# Patient Record
Sex: Male | Born: 1955 | Race: White | Hispanic: No | Marital: Married | Smoking: Former smoker
Health system: Southern US, Community
[De-identification: ages and names within clinical notes are randomized; demographics above are authoritative.]

## PROBLEM LIST (undated history)

## (undated) DIAGNOSIS — J45909 Unspecified asthma, uncomplicated: Secondary | ICD-10-CM

## (undated) DIAGNOSIS — M75101 Unspecified rotator cuff tear or rupture of right shoulder, not specified as traumatic: Secondary | ICD-10-CM

## (undated) DIAGNOSIS — E785 Hyperlipidemia, unspecified: Secondary | ICD-10-CM

## (undated) DIAGNOSIS — M1990 Unspecified osteoarthritis, unspecified site: Secondary | ICD-10-CM

## (undated) HISTORY — PX: JOINT REPLACEMENT: SHX530

## (undated) HISTORY — PX: TOTAL HIP ARTHROPLASTY: SHX124

## (undated) HISTORY — DX: Unspecified osteoarthritis, unspecified site: M19.90

## (undated) HISTORY — PX: COLONOSCOPY: SHX174

## (undated) HISTORY — DX: Hyperlipidemia, unspecified: E78.5

---

## 2003-02-25 ENCOUNTER — Inpatient Hospital Stay (HOSPITAL_COMMUNITY): Admission: RE | Admit: 2003-02-25 | Discharge: 2003-02-27 | Payer: Self-pay | Admitting: Orthopedic Surgery

## 2003-06-03 ENCOUNTER — Inpatient Hospital Stay (HOSPITAL_COMMUNITY): Admission: RE | Admit: 2003-06-03 | Discharge: 2003-06-05 | Payer: Self-pay | Admitting: Orthopedic Surgery

## 2011-05-10 ENCOUNTER — Encounter: Payer: Self-pay | Admitting: Gastroenterology

## 2017-11-02 LAB — MEASLES/MUMPS/RUBELLA IMMUNITY
Mumps IgG: >300
Rubella Virus Antibody IgG: 26.6

## 2018-03-06 LAB — LIPID PANEL
Cholesterol: 237 — AB (ref 0–200)
HDL: 54 (ref 35–70)
LDL CALC: 165
TRIGLYCERIDES: 81 (ref 40–160)

## 2018-03-06 LAB — CBC AND DIFFERENTIAL
HEMATOCRIT: 45 (ref 41–53)
HEMOGLOBIN: 15.2 (ref 13.5–17.5)
Neutrophils Absolute: 4100
WBC: 7.4

## 2018-03-06 LAB — BASIC METABOLIC PANEL
BUN: 21 (ref 4–21)
Creatinine: 1 (ref 0.6–1.3)
Glucose: 119
Potassium: 4.4 (ref 3.4–5.3)
SODIUM: 137 (ref 137–147)

## 2018-03-06 LAB — HEPATIC FUNCTION PANEL
ALT: 21 (ref 10–40)
AST: 16 (ref 14–40)
Alkaline Phosphatase: 55 (ref 25–125)
Bilirubin, Total: 0.9

## 2018-03-07 LAB — HEMOGLOBIN A1C: HEMOGLOBIN A1C: 5.9

## 2018-03-15 ENCOUNTER — Encounter: Payer: Self-pay | Admitting: Family Medicine

## 2018-03-15 ENCOUNTER — Ambulatory Visit (INDEPENDENT_AMBULATORY_CARE_PROVIDER_SITE_OTHER): Payer: BC Managed Care – PPO | Admitting: Family Medicine

## 2018-03-15 VITALS — BP 124/78 | HR 74 | Temp 98.1°F | Ht 69.0 in | Wt 221.0 lb

## 2018-03-15 DIAGNOSIS — R739 Hyperglycemia, unspecified: Secondary | ICD-10-CM | POA: Insufficient documentation

## 2018-03-15 DIAGNOSIS — Z1211 Encounter for screening for malignant neoplasm of colon: Secondary | ICD-10-CM | POA: Diagnosis not present

## 2018-03-15 DIAGNOSIS — E785 Hyperlipidemia, unspecified: Secondary | ICD-10-CM | POA: Insufficient documentation

## 2018-03-15 DIAGNOSIS — Z6832 Body mass index (BMI) 32.0-32.9, adult: Secondary | ICD-10-CM | POA: Diagnosis not present

## 2018-03-15 DIAGNOSIS — Z0001 Encounter for general adult medical examination with abnormal findings: Secondary | ICD-10-CM | POA: Diagnosis not present

## 2018-03-15 MED ORDER — ATORVASTATIN CALCIUM 40 MG PO TABS: 40.0000 mg | ORAL_TABLET | Freq: Every day | ORAL | 3 refills | Status: DC

## 2018-03-15 NOTE — Assessment & Plan Note (Addendum)
Patient with elevated total cholesterol and LDL greater than 160 on recent labs.  These results will be scanned into the chart.  We will start Lipitor 40 mg daily.  Discussed potential side effects.  Recheck in 1 year.

## 2018-03-15 NOTE — Progress Notes (Signed)
Chief Complaint:  Chad Manning is a 63 y.o. male who presents today for his annual comprehensive physical exam and to establish care.   Assessment/Plan:  Hyperglycemia Fasting glucose 119 and A1c 5.9 on recent blood work.  Does not want to start medications today.  Discussed lifestyle modifications including regular exercise and healthy, low carb diet.  We will recheck in 1 year.  Dyslipidemia Patient with elevated total cholesterol and LDL greater than 160 on recent labs.  These results will be scanned into the chart.  We will start Lipitor 40 mg daily.  Discussed potential side effects.  Recheck in 1 year.  BMI 32 Discussed lifestyle modifications.  Preventative Healthcare: Will place referral for colon cancer screening.  Patient Counseling(The following topics were reviewed and/or handout was given):  -Nutrition: Stressed importance of moderation in sodium/caffeine intake, saturated fat and cholesterol, caloric balance, sufficient intake of fresh fruits, vegetables, and fiber.  -Stressed the importance of regular exercise.   -Substance Abuse: Discussed cessation/primary prevention of tobacco, alcohol, or other drug use; driving or other dangerous activities under the influence; availability of treatment for abuse.   -Injury prevention: Discussed safety belts, safety helmets, smoke detector, smoking near bedding or upholstery.   -Sexuality: Discussed sexually transmitted diseases, partner selection, use of condoms, avoidance of unintended pregnancy and contraceptive alternatives.   -Dental health: Discussed importance of regular tooth brushing, flossing, and dental visits.  -Health maintenance and immunizations reviewed. Please refer to Health maintenance section.  Return to care in 1 year for next preventative visit.     Subjective:  HPI:  He has no acute complaints today.   Lifestyle Diet: No specific diets or eating plans.  Exercise: No specific exercises.    Depression screen PHQ 2/9 03/15/2018  Decreased Interest 0  Down, Depressed, Hopeless 0  PHQ - 2 Score 0   Health Maintenance Due  Topic Date Due  . Hepatitis C Screening  14-Apr-1955  . HIV Screening  06/20/1970  . COLONOSCOPY  06/19/2005    ROS: A complete review of systems was negative.   PMH:  The following were reviewed and entered/updated in epic: Past Medical History:  Diagnosis Date  . Hyperlipidemia    Patient Active Problem List   Diagnosis Date Noted  . Dyslipidemia 03/15/2018  . Hyperglycemia 03/15/2018   Past Surgical History:  Procedure Laterality Date  . TOTAL HIP ARTHROPLASTY     bilatera    Family History  Problem Relation Age of Onset  . Heart disease Father   . Cancer Mother   . Lung cancer Sister        smoker  . Colon cancer Neg Hx   . Prostate cancer Neg Hx     Medications- reviewed and updated Current Outpatient Medications  Medication Sig Dispense Refill  . aspirin 81 MG tablet Take 81 mg by mouth daily.    Marland Kitchen atorvastatin (LIPITOR) 40 MG tablet Take 1 tablet (40 mg total) by mouth daily. 90 tablet 3   No current facility-administered medications for this visit.    Allergies-reviewed and updated No Known Allergies  Social History   Socioeconomic History  . Marital status: Married    Spouse name: Not on file  . Number of children: Not on file  . Years of education: Not on file  . Highest education level: Not on file  Occupational History  . Not on file  Social Needs  . Financial resource strain: Not on file  . Food insecurity:    Worry:  Not on file    Inability: Not on file  . Transportation needs:    Medical: Not on file    Non-medical: Not on file  Tobacco Use  . Smoking status: Former Research scientist (life sciences)  . Smokeless tobacco: Never Used  Substance and Sexual Activity  . Alcohol use: Yes  . Drug use: Never  . Sexual activity: Yes  Lifestyle  . Physical activity:    Days per week: Not on file    Minutes per session: Not on  file  . Stress: Not on file  Relationships  . Social connections:    Talks on phone: Not on file    Gets together: Not on file    Attends religious service: Not on file    Active member of club or organization: Not on file    Attends meetings of clubs or organizations: Not on file    Relationship status: Not on file  Other Topics Concern  . Not on file  Social History Narrative  . Not on file        Objective:  Physical Exam: BP 124/78 (BP Location: Left Arm, Patient Position: Sitting, Cuff Size: Normal)   Pulse 74   Temp 98.1 F (36.7 C) (Oral)   Ht 5\' 9"  (1.753 m)   Wt 221 lb (100.2 kg)   SpO2 95%   BMI 32.64 kg/m   Body mass index is 32.64 kg/m. Wt Readings from Last 3 Encounters:  03/15/18 221 lb (100.2 kg)   Gen: NAD, resting comfortably HEENT: TMs normal bilaterally. OP clear. No thyromegaly noted.  CV: RRR with no murmurs appreciated Pulm: NWOB, CTAB with no crackles, wheezes, or rhonchi GI: Normal bowel sounds present. Soft, Nontender, Nondistended. MSK: no edema, cyanosis, or clubbing noted Skin: warm, dry Neuro: CN2-12 grossly intact. Strength 5/5 in upper and lower extremities. Reflexes symmetric and intact bilaterally.  Psych: Normal affect and thought content     Kamren Heskett M. Jerline Pain, MD 03/15/2018 8:49 AM

## 2018-03-15 NOTE — Assessment & Plan Note (Signed)
Fasting glucose 119 and A1c 5.9 on recent blood work.  Does not want to start medications today.  Discussed lifestyle modifications including regular exercise and healthy, low carb diet.  We will recheck in 1 year.

## 2018-03-15 NOTE — Patient Instructions (Signed)
It was very nice to see you today!  Please start the lipitor.   Eat at least 3 REAL meals and 1-2 snacks per day.  Aim for no more than 5 hours between eating.  Eat breakfast within one hour of getting up.    Obtain twice as many fruits/vegetables as protein or carbohydrate foods for both lunch and dinner.   Cut down on sweet beverages. This includes juice, soda, and sweet tea.    Exercise at least 150 minutes every week.    I will place a referral for your colonoscopy.  Come back in 1 year for your next physical or sooner as needed.   Take care, Dr Jerline Pain   Preventive Care 40-64 Years, Male Preventive care refers to lifestyle choices and visits with your health care provider that can promote health and wellness. What does preventive care include?   A yearly physical exam. This is also called an annual well check.  Dental exams once or twice a year.  Routine eye exams. Ask your health care provider how often you should have your eyes checked.  Personal lifestyle choices, including: ? Daily care of your teeth and gums. ? Regular physical activity. ? Eating a healthy diet. ? Avoiding tobacco and drug use. ? Limiting alcohol use. ? Practicing safe sex. ? Taking low-dose aspirin every day starting at age 79. What happens during an annual well check? The services and screenings done by your health care provider during your annual well check will depend on your age, overall health, lifestyle risk factors, and family history of disease. Counseling Your health care provider may ask you questions about your:  Alcohol use.  Tobacco use.  Drug use.  Emotional well-being.  Home and relationship well-being.  Sexual activity.  Eating habits.  Work and work Statistician. Screening You may have the following tests or measurements:  Height, weight, and BMI.  Blood pressure.  Lipid and cholesterol levels. These may be checked every 5 years, or more frequently if  you are over 14 years old.  Skin check.  Lung cancer screening. You may have this screening every year starting at age 30 if you have a 30-pack-year history of smoking and currently smoke or have quit within the past 15 years.  Colorectal cancer screening. All adults should have this screening starting at age 99 and continuing until age 43. Your health care provider may recommend screening at age 78. You will have tests every 1-10 years, depending on your results and the type of screening test. People at increased risk should start screening at an earlier age. Screening tests may include: ? Guaiac-based fecal occult blood testing. ? Fecal immunochemical test (FIT). ? Stool DNA test. ? Virtual colonoscopy. ? Sigmoidoscopy. During this test, a flexible tube with a tiny camera (sigmoidoscope) is used to examine your rectum and lower colon. The sigmoidoscope is inserted through your anus into your rectum and lower colon. ? Colonoscopy. During this test, a long, thin, flexible tube with a tiny camera (colonoscope) is used to examine your entire colon and rectum.  Prostate cancer screening. Recommendations will vary depending on your family history and other risks.  Hepatitis C blood test.  Hepatitis B blood test.  Sexually transmitted disease (STD) testing.  Diabetes screening. This is done by checking your blood sugar (glucose) after you have not eaten for a while (fasting). You may have this done every 1-3 years. Discuss your test results, treatment options, and if necessary, the need for more tests with  your health care provider. Vaccines Your health care provider may recommend certain vaccines, such as:  Influenza vaccine. This is recommended every year.  Tetanus, diphtheria, and acellular pertussis (Tdap, Td) vaccine. You may need a Td booster every 10 years.  Varicella vaccine. You may need this if you have not been vaccinated.  Zoster vaccine. You may need this after age  6.  Measles, mumps, and rubella (MMR) vaccine. You may need at least one dose of MMR if you were born in 1957 or later. You may also need a second dose.  Pneumococcal 13-valent conjugate (PCV13) vaccine. You may need this if you have certain conditions and have not been vaccinated.  Pneumococcal polysaccharide (PPSV23) vaccine. You may need one or two doses if you smoke cigarettes or if you have certain conditions.  Meningococcal vaccine. You may need this if you have certain conditions.  Hepatitis A vaccine. You may need this if you have certain conditions or if you travel or work in places where you may be exposed to hepatitis A.  Hepatitis B vaccine. You may need this if you have certain conditions or if you travel or work in places where you may be exposed to hepatitis B.  Haemophilus influenzae type b (Hib) vaccine. You may need this if you have certain risk factors. Talk to your health care provider about which screenings and vaccines you need and how often you need them. This information is not intended to replace advice given to you by your health care provider. Make sure you discuss any questions you have with your health care provider. Document Released: 01/24/2015 Document Revised: 02/17/2017 Document Reviewed: 10/29/2014 Elsevier Interactive Patient Education  2019 [employer].

## 2018-03-28 ENCOUNTER — Encounter: Payer: Self-pay | Admitting: Family Medicine

## 2018-03-31 ENCOUNTER — Encounter: Payer: Self-pay | Admitting: Family Medicine

## 2018-03-31 ENCOUNTER — Encounter: Payer: Self-pay | Admitting: Physical Therapy

## 2018-03-31 LAB — VARICELLA ZOSTER ANTIBODY, IGG: Varicella IgG: 2387

## 2018-06-13 ENCOUNTER — Encounter: Payer: Self-pay | Admitting: Internal Medicine

## 2018-07-10 ENCOUNTER — Ambulatory Visit (AMBULATORY_SURGERY_CENTER): Payer: BC Managed Care – PPO

## 2018-07-10 ENCOUNTER — Other Ambulatory Visit: Payer: Self-pay

## 2018-07-10 VITALS — Ht 71.0 in | Wt 220.0 lb

## 2018-07-10 DIAGNOSIS — Z1211 Encounter for screening for malignant neoplasm of colon: Secondary | ICD-10-CM

## 2018-07-10 MED ORDER — NA SULFATE-K SULFATE-MG SULF 17.5-3.13-1.6 GM/177ML PO SOLN: 1.0000 | Freq: Once | ORAL | 0 refills | Status: AC

## 2018-07-10 NOTE — Progress Notes (Signed)
Denies allergies to eggs or soy products. Denies complication of anesthesia or sedation. Denies use of weight loss medication. Denies use of O2.   Emmi instructions given for colonoscopy.  Pre-Visit was conducted by phone due to Covid 19. Instructions were reviewed and mailed to patients confirmed home address. Patient was encouraged to call if he had any questions or concerns regarding instructions.

## 2018-07-24 ENCOUNTER — Encounter: Payer: BC Managed Care – PPO | Admitting: Internal Medicine

## 2018-08-10 ENCOUNTER — Telehealth: Payer: Self-pay | Admitting: Internal Medicine

## 2018-08-10 NOTE — Telephone Encounter (Signed)

## 2018-08-11 ENCOUNTER — Ambulatory Visit (AMBULATORY_SURGERY_CENTER): Payer: BC Managed Care – PPO | Admitting: Internal Medicine

## 2018-08-11 ENCOUNTER — Encounter: Payer: Self-pay | Admitting: Internal Medicine

## 2018-08-11 ENCOUNTER — Other Ambulatory Visit: Payer: Self-pay

## 2018-08-11 VITALS — BP 111/66 | HR 55 | Temp 98.0°F | Resp 12 | Ht 71.0 in | Wt 220.0 lb

## 2018-08-11 DIAGNOSIS — Z1211 Encounter for screening for malignant neoplasm of colon: Secondary | ICD-10-CM

## 2018-08-11 DIAGNOSIS — D123 Benign neoplasm of transverse colon: Secondary | ICD-10-CM

## 2018-08-11 MED ORDER — SODIUM CHLORIDE 0.9 % IV SOLN: 500.0000 mL | Freq: Once | INTRAVENOUS | Status: DC

## 2018-08-11 NOTE — Op Note (Signed)
[place] Patient Name: Chad Manning Procedure Date: 08/11/2018 9:12 AM MRN: [id number] Endoscopist: Jerene Bears , MD Age: 63 Referring MD:  Date of Birth: 10-04-1955 Gender: Male Account #: [id number] Procedure:                Colonoscopy Indications:              Screening for colorectal malignant neoplasm, This                            is the patient's first colonoscopy Medicines:                Monitored Anesthesia Care Procedure:                Pre-Anesthesia Assessment:                           - Prior to the procedure, a History and Physical                            was performed, and patient medications and                            allergies were reviewed. The patient's tolerance of                            previous anesthesia was also reviewed. The risks                            and benefits of the procedure and the sedation                            options and risks were discussed with the patient.                            All questions were answered, and informed consent                            was obtained. Prior Anticoagulants: The patient has                            taken no previous anticoagulant or antiplatelet                            agents. ASA Grade Assessment: II - A patient with                            mild systemic disease. After reviewing the risks                            and benefits, the patient was deemed in                            satisfactory condition to undergo the procedure.  After obtaining informed consent, the colonoscope                            was passed under direct vision. Throughout the                            procedure, the patient's blood pressure, pulse, and                            oxygen saturations were monitored continuously. The                            Colonoscope was introduced through the anus and                            advanced to the cecum,  identified by appendiceal                            orifice and ileocecal valve. The colonoscopy was                            performed without difficulty. The patient tolerated                            the procedure well. The quality of the bowel                            preparation was good. The ileocecal valve,                            appendiceal orifice, and rectum were photographed. Scope In: 9:17:11 AM Scope Out: 9:31:03 AM Scope Withdrawal Time: 0 hours 11 minutes 27 seconds  Total Procedure Duration: 0 hours 13 minutes 52 seconds  Findings:                 The digital rectal exam was normal.                           A 5 mm polyp was found in the hepatic flexure. The                            polyp was sessile. The polyp was removed with a                            cold snare. Resection and retrieval were complete.                           Internal hemorrhoids were found during                            retroflexion. The hemorrhoids were small. Complications:            No immediate complications. Estimated Blood Loss:     Estimated blood loss was minimal. Impression:               -  One 5 mm polyp at the hepatic flexure, removed                            with a cold snare. Resected and retrieved.                           - Small internal hemorrhoids. Recommendation:           - Patient has a contact number available for                            emergencies. The signs and symptoms of potential                            delayed complications were discussed with the                            patient. Return to normal activities tomorrow.                            Written discharge instructions were provided to the                            patient.                           - Resume previous diet.                           - Continue present medications.                           - Await pathology results.                           - Repeat colonoscopy is  recommended for                            surveillance. The colonoscopy date will be                            determined after pathology results from today's                            exam become available for review. Jerene Bears, MD 08/11/2018 9:34:01 AM This report has been signed electronically.

## 2018-08-11 NOTE — Patient Instructions (Signed)
Impression/Recommendations:  Polyp handout given to patient. Hemorrhoid handout given to patient.  Resume previous diet. Continue present medications. Await pathology results. Repeat colonoscopy recommended for surveillance.  Date to be determined after pathology results reviewed.  YOU HAD AN ENDOSCOPIC PROCEDURE TODAY AT THE [facility]:   Refer to the procedure report that was given to you for any specific questions about what was found during the examination.  If the procedure report does not answer your questions, please call your gastroenterologist to clarify.  If you requested that your care partner not be given the details of your procedure findings, then the procedure report has been included in a sealed envelope for you to review at your convenience later.  YOU SHOULD EXPECT: Some feelings of bloating in the abdomen. Passage of more gas than usual.  Walking can help get rid of the air that was put into your GI tract during the procedure and reduce the bloating. If you had a lower endoscopy (such as a colonoscopy or flexible sigmoidoscopy) you may notice spotting of blood in your stool or on the toilet paper. If you underwent a bowel prep for your procedure, you may not have a normal bowel movement for a few days.  Please Note:  You might notice some irritation and congestion in your nose or some drainage.  This is from the oxygen used during your procedure.  There is no need for concern and it should clear up in a day or so.  SYMPTOMS TO REPORT IMMEDIATELY:   Following lower endoscopy (colonoscopy or flexible sigmoidoscopy):  Excessive amounts of blood in the stool  Significant tenderness or worsening of abdominal pains  Swelling of the abdomen that is new, acute  Fever of 100F or higher For urgent or emergent issues, a gastroenterologist can be reached at any hour by calling [phone].   DIET:  We do recommend a small meal at first, but then you may proceed to  your regular diet.  Drink plenty of fluids but you should avoid alcoholic beverages for 24 hours.  ACTIVITY:  You should plan to take it easy for the rest of today and you should NOT DRIVE or use heavy machinery until tomorrow (because of the sedation medicines used during the test).    FOLLOW UP: Our staff will call the number listed on your records 48-72 hours following your procedure to check on you and address any questions or concerns that you may have regarding the information given to you following your procedure. If we do not reach you, we will leave a message.  We will attempt to reach you two times.  During this call, we will ask if you have developed any symptoms of COVID 19. If you develop any symptoms (ie: fever, flu-like symptoms, shortness of breath, cough etc.) before then, please call [phone].  If you test positive for Covid 19 in the 2 weeks post procedure, please call and report this information to us.    If any biopsies were taken you will be contacted by phone or by letter within the next 1-3 weeks.  Please call us at [phone] if you have not heard about the biopsies in 3 weeks.    SIGNATURES/CONFIDENTIALITY: You and/or your care partner have signed paperwork which will be entered into your electronic medical record.  These signatures attest to the fact that that the information above on your After Visit Summary has been reviewed and is understood.  Full responsibility of the confidentiality of this   discharge information lies with you and/or your care-partner. 

## 2018-08-11 NOTE — Progress Notes (Signed)
To PACU, VSS. Report to [email or web address] 

## 2018-08-11 NOTE — Progress Notes (Signed)
Temperature taken by D.O., CMA and VS taken by [facility], CMA

## 2018-08-11 NOTE — Progress Notes (Signed)
Called to room to assist during endoscopic procedure.  Patient ID and intended procedure confirmed with present staff. Received instructions for my participation in the procedure from the performing physician.  

## 2018-08-11 NOTE — Progress Notes (Signed)
Pt's states no medical or surgical changes since previsit or office visit. 

## 2018-08-15 ENCOUNTER — Telehealth: Payer: Self-pay

## 2018-08-15 NOTE — Telephone Encounter (Signed)
  Follow up Call-  Call back number 08/11/2018  Post procedure Call Back phone  # [phone]  Permission to leave phone message Yes  Some recent data might be hidden     Patient questions:  Do you have a fever, pain , or abdominal swelling? No. Pain Score  0 *  Have you tolerated food without any problems? Yes.    Have you been able to return to your normal activities? Yes.    Do you have any questions about your discharge instructions: Diet   No. Medications  No. Follow up visit  No.  Do you have questions or concerns about your Care? No.  Actions: * If pain score is 4 or above: 1. No action needed, pain <4.Have you developed a fever since your procedure? no  2.   Have you had an respiratory symptoms (SOB or cough) since your procedure? no  3.   Have you tested positive for COVID 19 since your procedure no  4.   Have you had any family members/close contacts diagnosed with the COVID 19 since your procedure?  no   If yes to any of these questions please route to Joylene John, RN and Alphonsa Gin, Therapist, sports.

## 2018-08-17 ENCOUNTER — Encounter: Payer: Self-pay | Admitting: Internal Medicine

## 2018-11-29 DIAGNOSIS — M2021 Hallux rigidus, right foot: Secondary | ICD-10-CM | POA: Insufficient documentation

## 2019-03-05 ENCOUNTER — Other Ambulatory Visit: Payer: Self-pay | Admitting: Family Medicine

## 2019-06-01 ENCOUNTER — Other Ambulatory Visit: Payer: Self-pay | Admitting: Family Medicine

## 2019-06-07 ENCOUNTER — Telehealth: Payer: Self-pay | Admitting: Family Medicine

## 2019-06-07 DIAGNOSIS — E785 Hyperlipidemia, unspecified: Secondary | ICD-10-CM

## 2019-06-07 MED ORDER — ATORVASTATIN CALCIUM 40 MG PO TABS: ORAL_TABLET | ORAL | 0 refills | Status: DC

## 2019-06-07 NOTE — Telephone Encounter (Signed)
Lipitor 40 MG sent to [place]. Not sure why patient is requesting a PA.

## 2019-06-07 NOTE — Telephone Encounter (Signed)
MEDICATION: Lipitor 40 MG   PHARMACY: [facility] [place]  Comments: Pt called requesting a prior auth for his medication.   **Let patient know to contact pharmacy at the end of the day to make sure medication is ready. **  ** Please notify patient to allow 48-72 hours to process**  **Encourage patient to contact the pharmacy for refills or they can request refills through [facility]**

## 2019-09-02 ENCOUNTER — Other Ambulatory Visit: Payer: Self-pay | Admitting: Family Medicine

## 2019-09-02 DIAGNOSIS — E785 Hyperlipidemia, unspecified: Secondary | ICD-10-CM

## 2019-10-10 ENCOUNTER — Other Ambulatory Visit: Payer: Self-pay | Admitting: Family Medicine

## 2019-10-10 ENCOUNTER — Telehealth: Payer: Self-pay

## 2019-10-10 DIAGNOSIS — E785 Hyperlipidemia, unspecified: Secondary | ICD-10-CM

## 2019-10-10 NOTE — Telephone Encounter (Signed)
..   LAST APPOINTMENT DATE: 10/10/2019   NEXT APPOINTMENT DATE:@Visit  date not found  MEDICATION:atorvastatin (LIPITOR) 40 MG tablet    PHARMACY:[id number] - [place], [place] - [address]

## 2019-10-10 NOTE — Telephone Encounter (Signed)
Rx send today, Patient needs OV

## 2019-10-12 ENCOUNTER — Other Ambulatory Visit: Payer: Self-pay | Admitting: Family Medicine

## 2019-10-12 DIAGNOSIS — E785 Hyperlipidemia, unspecified: Secondary | ICD-10-CM

## 2019-11-06 ENCOUNTER — Telehealth: Payer: Self-pay

## 2019-11-06 NOTE — Telephone Encounter (Signed)
Patient is requesting a referral to pulmonary for a preventative lung screening

## 2019-11-06 NOTE — Telephone Encounter (Signed)
Can we have him come in for a physical? He has not been here in a year and a half.  Chad Manning. Jerline Pain, MD 11/06/2019 3:36 PM

## 2019-11-06 NOTE — Telephone Encounter (Signed)
Ok to place referral.

## 2019-11-06 NOTE — Telephone Encounter (Signed)
Please schedule pt for CPE per Dr. Jerline Pain.

## 2019-11-07 NOTE — Telephone Encounter (Signed)
Patient is scheduled 12/3

## 2019-11-18 ENCOUNTER — Other Ambulatory Visit: Payer: Self-pay | Admitting: Family Medicine

## 2019-11-18 DIAGNOSIS — E785 Hyperlipidemia, unspecified: Secondary | ICD-10-CM

## 2019-12-14 ENCOUNTER — Other Ambulatory Visit: Payer: Self-pay

## 2019-12-14 ENCOUNTER — Ambulatory Visit (INDEPENDENT_AMBULATORY_CARE_PROVIDER_SITE_OTHER): Payer: BC Managed Care – PPO | Admitting: Family Medicine

## 2019-12-14 ENCOUNTER — Encounter: Payer: Self-pay | Admitting: Family Medicine

## 2019-12-14 VITALS — BP 117/74 | HR 65 | Temp 98.2°F | Ht 71.0 in | Wt 219.0 lb

## 2019-12-14 DIAGNOSIS — R739 Hyperglycemia, unspecified: Secondary | ICD-10-CM

## 2019-12-14 DIAGNOSIS — Z0001 Encounter for general adult medical examination with abnormal findings: Secondary | ICD-10-CM

## 2019-12-14 DIAGNOSIS — E785 Hyperlipidemia, unspecified: Secondary | ICD-10-CM

## 2019-12-14 DIAGNOSIS — Z87891 Personal history of nicotine dependence: Secondary | ICD-10-CM | POA: Diagnosis not present

## 2019-12-14 MED ORDER — ATORVASTATIN CALCIUM 40 MG PO TABS: 40.0000 mg | ORAL_TABLET | Freq: Every day | ORAL | 3 refills | Status: DC

## 2019-12-14 NOTE — Assessment & Plan Note (Signed)
On lipitor 40 mg daily.  He will get lipids and CMET checked at work.

## 2019-12-14 NOTE — Patient Instructions (Signed)
It was very nice to see you today!  I will refill your cholesterol medication today.  Please get blood work done at work.  I would like to see your cholesterol number, your O9G, your metabolic panel, and your blood counts.  I will place a referral for your lung cancer screening.  I will see you back in a year or so for your next physical.  Please come back to see me sooner if needed.  Take care, Dr Jerline Pain  Please try these tips to maintain a healthy lifestyle:   Eat at least 3 REAL meals and 1-2 snacks per day.  Aim for no more than 5 hours between eating.  If you eat breakfast, please do so within one hour of getting up.    Each meal should contain half fruits/vegetables, one quarter protein, and one quarter carbs (no bigger than a computer mouse)   Cut down on sweet beverages. This includes juice, soda, and sweet tea.     Drink at least 1 glass of water with each meal and aim for at least 8 glasses per day   Exercise at least 150 minutes every week.    Preventive Care 34-37 Years Old, Male Preventive care refers to lifestyle choices and visits with your health care provider that can promote health and wellness. This includes:  A yearly physical exam. This is also called an annual well check.  Regular dental and eye exams.  Immunizations.  Screening for certain conditions.  Healthy lifestyle choices, such as eating a healthy diet, getting regular exercise, not using drugs or products that contain nicotine and tobacco, and limiting alcohol use. What can I expect for my preventive care visit? Physical exam Your health care provider will check:  Height and weight. These may be used to calculate body mass index (BMI), which is a measurement that tells if you are at a healthy weight.  Heart rate and blood pressure.  Your skin for abnormal spots. Counseling Your health care provider may ask you questions about:  Alcohol, tobacco, and drug use.  Emotional  well-being.  Home and relationship well-being.  Sexual activity.  Eating habits.  Work and work Statistician. What immunizations do I need?  Influenza (flu) vaccine  This is recommended every year. Tetanus, diphtheria, and pertussis (Tdap) vaccine  You may need a Td booster every 10 years. Varicella (chickenpox) vaccine  You may need this vaccine if you have not already been vaccinated. Zoster (shingles) vaccine  You may need this after age 41. Measles, mumps, and rubella (MMR) vaccine  You may need at least one dose of MMR if you were born in 1957 or later. You may also need a second dose. Pneumococcal conjugate (PCV13) vaccine  You may need this if you have certain conditions and were not previously vaccinated. Pneumococcal polysaccharide (PPSV23) vaccine  You may need one or two doses if you smoke cigarettes or if you have certain conditions. Meningococcal conjugate (MenACWY) vaccine  You may need this if you have certain conditions. Hepatitis A vaccine  You may need this if you have certain conditions or if you travel or work in places where you may be exposed to hepatitis A. Hepatitis B vaccine  You may need this if you have certain conditions or if you travel or work in places where you may be exposed to hepatitis B. Haemophilus influenzae type b (Hib) vaccine  You may need this if you have certain risk factors. Human papillomavirus (HPV) vaccine  If recommended by  your health care provider, you may need three doses over 6 months. You may receive vaccines as individual doses or as more than one vaccine together in one shot (combination vaccines). Talk with your health care provider about the risks and benefits of combination vaccines. What tests do I need? Blood tests  Lipid and cholesterol levels. These may be checked every 5 years, or more frequently if you are over 93 years old.  Hepatitis C test.  Hepatitis B test. Screening  Lung cancer screening.  You may have this screening every year starting at age 72 if you have a 30-pack-year history of smoking and currently smoke or have quit within the past 15 years.  Prostate cancer screening. Recommendations will vary depending on your family history and other risks.  Colorectal cancer screening. All adults should have this screening starting at age 69 and continuing until age 39. Your health care provider may recommend screening at age 72 if you are at increased risk. You will have tests every 1-10 years, depending on your results and the type of screening test.  Diabetes screening. This is done by checking your blood sugar (glucose) after you have not eaten for a while (fasting). You may have this done every 1-3 years.  Sexually transmitted disease (STD) testing. Follow these instructions at home: Eating and drinking  Eat a diet that includes fresh fruits and vegetables, whole grains, lean protein, and low-fat dairy products.  Take vitamin and mineral supplements as recommended by your health care provider.  Do not drink alcohol if your health care provider tells you not to drink.  If you drink alcohol: ? Limit how much you have to 0-2 drinks a day. ? Be aware of how much alcohol is in your drink. In the [place] one drink equals one 12 oz bottle of beer (355 mL), one 5 oz glass of wine (148 mL), or one 1 oz glass of hard liquor (44 mL). Lifestyle  Take daily care of your teeth and gums.  Stay active. Exercise for at least 30 minutes on 5 or more days each week.  Do not use any products that contain nicotine or tobacco, such as cigarettes, e-cigarettes, and chewing tobacco. If you need help quitting, ask your health care provider.  If you are sexually active, practice safe sex. Use a condom or other form of protection to prevent STIs (sexually transmitted infections).  Talk with your health care provider about taking a low-dose aspirin every day starting at age 42. What's next?  Go  to your health care provider once a year for a well check visit.  Ask your health care provider how often you should have your eyes and teeth checked.  Stay up to date on all vaccines. This information is not intended to replace advice given to you by your health care provider. Make sure you discuss any questions you have with your health care provider. Document Revised: 12/22/2017 Document Reviewed: 12/22/2017 Elsevier Patient Education  2020 [employer].

## 2019-12-14 NOTE — Progress Notes (Signed)
Chief Complaint:  Chad Manning is a 64 y.o. male who presents today for his annual comprehensive physical exam.    Assessment/Plan:  Chronic Problems Addressed Today: Former smoker Will place referral for lung cancer screening.  Hyperglycemia He will get A1c checked at work.  Dyslipidemia On lipitor 40 mg daily.  He will get lipids and CMET checked at work.  Body mass index is 30.54 kg/m. / Obese  BMI Metric Follow Up - 12/14/19 0941      BMI Metric Follow Up-Please document annually   BMI Metric Follow Up Education provided            Preventative Healthcare: Declined PSA.  Will place referral for lung cancer screening.  He will get labs done at work.  Up-to-date on flu and Covid vaccine.  Up-to-date on colon cancer screening.  Patient Counseling(The following topics were reviewed and/or handout was given):  -Nutrition: Stressed importance of moderation in sodium/caffeine intake, saturated fat and cholesterol, caloric balance, sufficient intake of fresh fruits, vegetables, and fiber.  -Stressed the importance of regular exercise.   -Substance Abuse: Discussed cessation/primary prevention of tobacco, alcohol, or other drug use; driving or other dangerous activities under the influence; availability of treatment for abuse.   -Injury prevention: Discussed safety belts, safety helmets, smoke detector, smoking near bedding or upholstery.   -Sexuality: Discussed sexually transmitted diseases, partner selection, use of condoms, avoidance of unintended pregnancy and contraceptive alternatives.   -Dental health: Discussed importance of regular tooth brushing, flossing, and dental visits.  -Health maintenance and immunizations reviewed. Please refer to Health maintenance section.  Return to care in 1 year for next preventative visit.     Subjective:  HPI:  He has no acute complaints today. See A/p for status of chronic conditions.   Lifestyle Diet: Balanced. Trying to cut  out carbs.  Exercise: Likes walking.   Depression screen PHQ 2/9 12/14/2019  Decreased Interest 0  Down, Depressed, Hopeless 0  PHQ - 2 Score 0    Health Maintenance Due  Topic Date Due  . Hepatitis C Screening  Never done  . HIV Screening  Never done     ROS: Per HPI, otherwise a complete review of systems was negative.   PMH:  The following were reviewed and entered/updated in epic: Past Medical History:  Diagnosis Date  . Arthritis   . Hyperlipidemia    Patient Active Problem List   Diagnosis Date Noted  . Former smoker 12/14/2019  . Dyslipidemia 03/15/2018  . Hyperglycemia 03/15/2018   Past Surgical History:  Procedure Laterality Date  . TOTAL HIP ARTHROPLASTY     bilatera    Family History  Problem Relation Age of Onset  . Heart disease Father   . Cancer Mother   . Lung cancer Sister        smoker  . Colon cancer Neg Hx   . Prostate cancer Neg Hx   . Esophageal cancer Neg Hx   . Rectal cancer Neg Hx     Medications- reviewed and updated Current Outpatient Medications  Medication Sig Dispense Refill  . aspirin 81 MG tablet Take 81 mg by mouth daily.    Marland Kitchen atorvastatin (LIPITOR) 40 MG tablet Take 1 tablet (40 mg total) by mouth daily. 90 tablet 3   No current facility-administered medications for this visit.    Allergies-reviewed and updated No Known Allergies  Social History   Socioeconomic History  . Marital status: Married    Spouse name: Not on file  .  Number of children: Not on file  . Years of education: Not on file  . Highest education level: Not on file  Occupational History  . Not on file  Tobacco Use  . Smoking status: Former Smoker    Packs/day: 0.50    Years: 40.00    Pack years: 20.00    Quit date: 2010    Years since quitting: 11.9  . Smokeless tobacco: Never Used  Substance and Sexual Activity  . Alcohol use: Yes    Alcohol/week: 5.0 - 10.0 standard drinks    Types: 5 - 10 Cans of beer per week    Comment: Beer  .  Drug use: Never  . Sexual activity: Yes  Other Topics Concern  . Not on file  Social History Narrative  . Not on file   Social Determinants of Health   Financial Resource Strain:   . Difficulty of Paying Living Expenses: Not on file  Food Insecurity:   . Worried About Charity fundraiser in the Last Year: Not on file  . Ran Out of Food in the Last Year: Not on file  Transportation Needs:   . Lack of Transportation (Medical): Not on file  . Lack of Transportation (Non-Medical): Not on file  Physical Activity:   . Days of Exercise per Week: Not on file  . Minutes of Exercise per Session: Not on file  Stress:   . Feeling of Stress : Not on file  Social Connections:   . Frequency of Communication with Friends and Family: Not on file  . Frequency of Social Gatherings with Friends and Family: Not on file  . Attends Religious Services: Not on file  . Active Member of Clubs or Organizations: Not on file  . Attends Archivist Meetings: Not on file  . Marital Status: Not on file        Objective:  Physical Exam: BP 117/74   Pulse 65   Temp 98.2 F (36.8 C) (Temporal)   Ht 5\' 11"  (1.803 m)   Wt 219 lb (99.3 kg)   SpO2 97%   BMI 30.54 kg/m   Body mass index is 30.54 kg/m. Wt Readings from Last 3 Encounters:  12/14/19 219 lb (99.3 kg)  08/11/18 220 lb (99.8 kg)  07/10/18 220 lb (99.8 kg)   Gen: NAD, resting comfortably HEENT: TMs normal bilaterally. OP clear. No thyromegaly noted.  CV: RRR with no murmurs appreciated Pulm: NWOB, CTAB with no crackles, wheezes, or rhonchi GI: Normal bowel sounds present. Soft, Nontender, Nondistended. MSK: no edema, cyanosis, or clubbing noted Skin: warm, dry Neuro: CN2-12 grossly intact. Strength 5/5 in upper and lower extremities. Reflexes symmetric and intact bilaterally.  Psych: Normal affect and thought content     Ravindra Baranek M. Jerline Pain, MD 12/14/2019 9:42 AM

## 2019-12-14 NOTE — Assessment & Plan Note (Signed)
He will get A1c checked at work.

## 2019-12-14 NOTE — Assessment & Plan Note (Signed)
Will place referral for lung cancer screening.

## 2019-12-27 ENCOUNTER — Telehealth: Payer: Self-pay | Admitting: Acute Care

## 2019-12-27 DIAGNOSIS — Z87891 Personal history of nicotine dependence: Secondary | ICD-10-CM

## 2019-12-27 NOTE — Telephone Encounter (Signed)
Spoke with pt and scheduled [facility] 01/30/20 11:00  CT ordered Nothing further needed

## 2020-01-30 ENCOUNTER — Ambulatory Visit
Admission: RE | Admit: 2020-01-30 | Discharge: 2020-01-30 | Disposition: A | Discharge status: Home / Self Care | Payer: BC Managed Care – PPO | Source: Ambulatory Visit | Attending: Acute Care | Admitting: Acute Care

## 2020-01-30 ENCOUNTER — Other Ambulatory Visit: Payer: Self-pay

## 2020-01-30 ENCOUNTER — Encounter: Payer: Self-pay | Admitting: Acute Care

## 2020-01-30 ENCOUNTER — Ambulatory Visit (INDEPENDENT_AMBULATORY_CARE_PROVIDER_SITE_OTHER): Payer: BC Managed Care – PPO | Admitting: Acute Care

## 2020-01-30 DIAGNOSIS — Z87891 Personal history of nicotine dependence: Secondary | ICD-10-CM

## 2020-01-30 DIAGNOSIS — Z122 Encounter for screening for malignant neoplasm of respiratory organs: Secondary | ICD-10-CM

## 2020-01-30 NOTE — Progress Notes (Signed)
Shared Decision Making Visit Lung Cancer Screening Program [phone])   Eligibility:  Age 65 y.o.  Pack Years Smoking History Calculation 30 pack year smoking history (# packs/per year x # years smoked)  Recent History of coughing up blood  no  Unexplained weight loss? no ( >Than 15 pounds within the last 6 months )  Prior History Lung / other cancer no (Diagnosis within the last 5 years already requiring surveillance chest CT Scans).  Smoking Status Former Smoker  Former Smokers: Years since quit: 5 years  Quit Date: 2016  Visit Components:  Discussion included one or more decision making aids. yes  Discussion included risk/benefits of screening. yes  Discussion included potential follow up diagnostic testing for abnormal scans. yes  Discussion included meaning and risk of over diagnosis. yes  Discussion included meaning and risk of False Positives. yes  Discussion included meaning of total radiation exposure. yes  Counseling Included:  Importance of adherence to annual lung cancer LDCT screening. yes  Impact of comorbidities on ability to participate in the program. yes  Ability and willingness to under diagnostic treatment. yes  Smoking Cessation Counseling:  Current Smokers:   Discussed importance of smoking cessation. yes  Information about tobacco cessation classes and interventions provided to patient. yes  Patient provided with "ticket" for LDCT Scan. yes  Symptomatic Patient. no  Counseling  Diagnosis Code: Tobacco Use Z72.0  Asymptomatic Patient yes  Counseling (Intermediate counseling: > three minutes counseling) [id number]  Former Smokers:   Discussed the importance of maintaining cigarette abstinence. yes  Diagnosis Code: Personal History of Nicotine Dependence. Y40.347  Information about tobacco cessation classes and interventions provided to patient. Yes  Patient provided with "ticket" for LDCT Scan. yes  Written Order for Lung Cancer  Screening with LDCT placed in Epic. Yes (CT Chest Lung Cancer Screening Low Dose W/O CM) [id number] Z12.2-Screening of respiratory organs Z87.891-Personal history of nicotine dependence  I spent 25 minutes of face to face time with Mr. Zabriskie discussing the risks and benefits of lung cancer screening. We viewed a power point together that explained in detail the above noted topics. We took the time to pause the power point at intervals to allow for questions to be asked and answered to ensure understanding. We discussed that he had taken the single most powerful action possible to decrease his risk of developing lung cancer when he quit smoking. I counseled him to remain smoke free, and to contact me if he ever had the desire to smoke again so that I can provide resources and tools to help support the effort to remain smoke free. We discussed the time and location of the scan, and that either  Doroteo Glassman RN or I will call with the results within  24-48 hours of receiving them. He has my card and contact information in the event he needs to speak with me, in addition to a copy of the power point we reviewed as a resource. He verbalized understanding of all of the above and had no further questions upon leaving the office.     I explained to the patient that there has been a high incidence of coronary artery disease noted on these exams. I explained that this is a non-gated exam therefore degree or severity cannot be determined. This patient is on statin therapy. I have asked the patient to follow-up with their PCP regarding any incidental finding of coronary artery disease and management with diet or medication as they feel is clinically indicated.  The patient verbalized understanding of the above and had no further questions.     Magdalen Spatz, NP 01/30/2020

## 2020-01-30 NOTE — Patient Instructions (Signed)
Thank you for participating in the [facility]. It was our pleasure to meet you today. We will call you with the results of your scan within the next few days. Your scan will be assigned a Lung RADS category score by the physicians reading the scans.  This Lung RADS score determines follow up scanning.  See below for description of categories, and follow up screening recommendations. We will be in touch to schedule your follow up screening annually or based on recommendations of our providers. We will fax a copy of your scan results to your Primary Care Physician, or the physician who referred you to the program, to ensure they have the results. Please call the office if you have any questions or concerns regarding your scanning experience or results.  Our office number is [phone]. Please speak with Denise Phelps, RN. She is our Lung Cancer Screening RN. If she is unavailable when you call, please have the office staff send her a message. She will return your call at her earliest convenience. Remember, if your scan is normal, we will scan you annually as long as you continue to meet the criteria for the program. (Age 55-77, Current smoker or smoker who has quit within the last 15 years). If you are a smoker, remember, quitting is the single most powerful action that you can take to decrease your risk of lung cancer and other pulmonary, breathing related problems. We know quitting is hard, and we are here to help.  Please let us know if there is anything we can do to help you meet your goal of quitting. If you are a former smoker, congratulations. We are proud of you! Remain smoke free! Remember you can refer friends or family members through the number above.  We will screen them to make sure they meet criteria for the program. Thank you for helping us take better care of you by participating in Lung Screening.  Lung RADS Categories:  Lung RADS 1: no nodules  or definitely non-concerning nodules.  Recommendation is for a repeat annual scan in 12 months.  Lung RADS 2:  nodules that are non-concerning in appearance and behavior with a very low likelihood of becoming an active cancer. Recommendation is for a repeat annual scan in 12 months.  Lung RADS 3: nodules that are probably non-concerning , includes nodules with a low likelihood of becoming an active cancer.  Recommendation is for a 6-month repeat screening scan. Often noted after an upper respiratory illness. We will be in touch to make sure you have no questions, and to schedule your 6-month scan.  Lung RADS 4 A: nodules with concerning findings, recommendation is most often for a follow up scan in 3 months or additional testing based on our provider's assessment of the scan. We will be in touch to make sure you have no questions and to schedule the recommended 3 month follow up scan.  Lung RADS 4 B:  indicates findings that are concerning. We will be in touch with you to schedule additional diagnostic testing based on our provider's  assessment of the scan.   

## 2020-02-12 NOTE — Progress Notes (Signed)

## 2020-02-14 ENCOUNTER — Other Ambulatory Visit: Payer: Self-pay | Admitting: *Deleted

## 2020-02-14 DIAGNOSIS — Z87891 Personal history of nicotine dependence: Secondary | ICD-10-CM

## 2020-12-09 ENCOUNTER — Other Ambulatory Visit: Payer: Self-pay | Admitting: Family Medicine

## 2020-12-09 DIAGNOSIS — E785 Hyperlipidemia, unspecified: Secondary | ICD-10-CM

## 2020-12-31 LAB — BASIC METABOLIC PANEL
BUN: 14 (ref 4–21)
CO2: 24 — AB (ref 13–22)
Chloride: 105 (ref 99–108)
Creatinine: 0.9 (ref 0.6–1.3)
Glucose: 134
Potassium: 4.6 (ref 3.4–5.3)
Sodium: 137 (ref 137–147)

## 2020-12-31 LAB — CBC AND DIFFERENTIAL
HCT: 40 — AB (ref 41–53)
Hemoglobin: 13.3 — AB (ref 13.5–17.5)
Neutrophils Absolute: 5298
Platelets: 229 (ref 150–399)
WBC: 8.7

## 2020-12-31 LAB — COMPREHENSIVE METABOLIC PANEL
Albumin: 4 (ref 3.5–5.0)
Calcium: 9.2 (ref 8.7–10.7)
Globulin: 2.5

## 2020-12-31 LAB — LIPID PANEL
Cholesterol: 157 (ref 0–200)
HDL: 60 (ref 35–70)
LDL Cholesterol: 80
LDl/HDL Ratio: 2.6
Triglycerides: 83 (ref 40–160)

## 2020-12-31 LAB — TSH: TSH: 2.77 (ref 0.41–5.90)

## 2020-12-31 LAB — HEPATIC FUNCTION PANEL
ALT: 23 (ref 10–40)
AST: 19 (ref 14–40)
Alkaline Phosphatase: 72 (ref 25–125)

## 2020-12-31 LAB — HEMOGLOBIN A1C: Hemoglobin A1C: 6.2

## 2020-12-31 LAB — CBC: RBC: 4.35 (ref 3.87–5.11)

## 2021-01-06 ENCOUNTER — Other Ambulatory Visit: Payer: Self-pay | Admitting: Family Medicine

## 2021-01-06 DIAGNOSIS — E785 Hyperlipidemia, unspecified: Secondary | ICD-10-CM

## 2021-01-14 ENCOUNTER — Other Ambulatory Visit: Payer: Self-pay

## 2021-01-14 ENCOUNTER — Ambulatory Visit (INDEPENDENT_AMBULATORY_CARE_PROVIDER_SITE_OTHER): Payer: BC Managed Care – PPO | Admitting: Family Medicine

## 2021-01-14 ENCOUNTER — Encounter: Payer: Self-pay | Admitting: Family Medicine

## 2021-01-14 VITALS — BP 124/80 | HR 79 | Temp 97.9°F | Ht 70.0 in | Wt 215.4 lb

## 2021-01-14 DIAGNOSIS — E785 Hyperlipidemia, unspecified: Secondary | ICD-10-CM | POA: Diagnosis not present

## 2021-01-14 DIAGNOSIS — Z6830 Body mass index (BMI) 30.0-30.9, adult: Secondary | ICD-10-CM

## 2021-01-14 DIAGNOSIS — E669 Obesity, unspecified: Secondary | ICD-10-CM

## 2021-01-14 DIAGNOSIS — R739 Hyperglycemia, unspecified: Secondary | ICD-10-CM | POA: Diagnosis not present

## 2021-01-14 DIAGNOSIS — Z0001 Encounter for general adult medical examination with abnormal findings: Secondary | ICD-10-CM | POA: Diagnosis not present

## 2021-01-14 NOTE — Patient Instructions (Signed)
It was very nice to see you today!  Please continue working on diet and exercise.  We will see you back in 1 year for your next checkup.  Please come back to see [place] sooner if needed.  Take care, Dr Jerline Pain  PLEASE NOTE:  If you had any lab tests please let us know if you have not heard back within a few days. You may see your results on mychart before we have a chance to review them but we will give you a call once they are reviewed by [place]. If we ordered any referrals today, please let us know if you have not heard from their office within the next week.   Please try these tips to maintain a healthy lifestyle:  Eat at least 3 REAL meals and 1-2 snacks per day.  Aim for no more than 5 hours between eating.  If you eat breakfast, please do so within one hour of getting up.   Each meal should contain half fruits/vegetables, one quarter protein, and one quarter carbs (no bigger than a computer mouse)  Cut down on sweet beverages. This includes juice, soda, and sweet tea.   Drink at least 1 glass of water with each meal and aim for at least 8 glasses per day  Exercise at least 150 minutes every week.    Preventive Care 46 Years and Older, Male Preventive care refers to lifestyle choices and visits with your health care provider that can promote health and wellness. Preventive care visits are also called wellness exams. What can I expect for my preventive care visit? Counseling During your preventive care visit, your health care provider may ask about your: Medical history, including: Past medical problems. Family medical history. History of falls. Current health, including: Emotional well-being. Home life and relationship well-being. Sexual activity. Memory and ability to understand (cognition). Lifestyle, including: Alcohol, nicotine or tobacco, and drug use. Access to firearms. Diet, exercise, and sleep habits. Work and work Statistician. Sunscreen use. Safety issues such as  seatbelt and bike helmet use. Physical exam Your health care provider will check your: Height and weight. These may be used to calculate your BMI (body mass index). BMI is a measurement that tells if you are at a healthy weight. Waist circumference. This measures the distance around your waistline. This measurement also tells if you are at a healthy weight and may help predict your risk of certain diseases, such as type 2 diabetes and high blood pressure. Heart rate and blood pressure. Body temperature. Skin for abnormal spots. What immunizations do I need? Vaccines are usually given at various ages, according to a schedule. Your health care provider will recommend vaccines for you based on your age, medical history, and lifestyle or other factors, such as travel or where you work. What tests do I need? Screening Your health care provider may recommend screening tests for certain conditions. This may include: Lipid and cholesterol levels. Diabetes screening. This is done by checking your blood sugar (glucose) after you have not eaten for a while (fasting). Hepatitis C test. Hepatitis B test. HIV (human immunodeficiency virus) test. STI (sexually transmitted infection) testing, if you are at risk. Lung cancer screening. Colorectal cancer screening. Prostate cancer screening. Abdominal aortic aneurysm (AAA) screening. You may need this if you are a current or former smoker. Talk with your health care provider about your test results, treatment options, and if necessary, the need for more tests. Follow these instructions at home: Eating and drinking  Eat  a diet that includes fresh fruits and vegetables, whole grains, lean protein, and low-fat dairy products. Limit your intake of foods with high amounts of sugar, saturated fats, and salt. Take vitamin and mineral supplements as recommended by your health care provider. Do not drink alcohol if your health care provider tells you not to  drink. If you drink alcohol: Limit how much you have to 0-2 drinks a day. Know how much alcohol is in your drink. In the [place] one drink equals one 12 oz bottle of beer (355 mL), one 5 oz glass of wine (148 mL), or one 1 oz glass of hard liquor (44 mL). Lifestyle Brush your teeth every morning and night with fluoride toothpaste. Floss one time each day. Exercise for at least 30 minutes 5 or more days each week. Do not use any products that contain nicotine or tobacco. These products include cigarettes, chewing tobacco, and vaping devices, such as e-cigarettes. If you need help quitting, ask your health care provider. Do not use drugs. If you are sexually active, practice safe sex. Use a condom or other form of protection to prevent STIs. Take aspirin only as told by your health care provider. Make sure that you understand how much to take and what form to take. Work with your health care provider to find out whether it is safe and beneficial for you to take aspirin daily. Ask your health care provider if you need to take a cholesterol-lowering medicine (statin). Find healthy ways to manage stress, such as: Meditation, yoga, or listening to music. Journaling. Talking to a trusted person. Spending time with friends and family. Safety Always wear your seat belt while driving or riding in a vehicle. Do not drive: If you have been drinking alcohol. Do not ride with someone who has been drinking. When you are tired or distracted. While texting. If you have been using any mind-altering substances or drugs. Wear a helmet and other protective equipment during sports activities. If you have firearms in your house, make sure you follow all gun safety procedures. Minimize exposure to UV radiation to reduce your risk of skin cancer. What's next? Visit your health care provider once a year for an annual wellness visit. Ask your health care provider how often you should have your eyes and teeth  checked. Stay up to date on all vaccines. This information is not intended to replace advice given to you by your health care provider. Make sure you discuss any questions you have with your health care provider. Document Revised: 06/25/2020 Document Reviewed: 06/25/2020 Elsevier Patient Education  [place].

## 2021-01-14 NOTE — Assessment & Plan Note (Signed)
Had a lipid panel done at work recently.  Numbers were at goal.  We will scan into the chart.  Continue Lipitor 40 mg daily.  He is tolerating well.  Minimal side effects.

## 2021-01-14 NOTE — Assessment & Plan Note (Signed)
A1c 6.2 a few weeks ago at work.  He does not wish to start medication at this point.  We discussed lifestyle modifications.  We can recheck in 6 to 12 months.

## 2021-01-14 NOTE — Progress Notes (Signed)
Chief Complaint:  Chad Manning is a 66 y.o. male who presents today for his annual comprehensive physical exam.    Assessment/Plan:  Chronic Problems Addressed Today: Hyperglycemia A1c 6.2 a few weeks ago at work.  He does not wish to start medication at this point.  We discussed lifestyle modifications.  We can recheck in 6 to 12 months.  Dyslipidemia Had a lipid panel done at work recently.  Numbers were at goal.  We will scan into the chart.  Continue Lipitor 40 mg daily.  He is tolerating well.  Minimal side effects.  Body mass index is 30.91 kg/m. / Obese  BMI Metric Follow Up - 01/14/21 0917       BMI Metric Follow Up-Please document annually   BMI Metric Follow Up Education provided             Preventative Healthcare: Up-to-date on flu vaccine.  Up-to-date on colon cancer screening.  Deferred pneumonia vaccine.  Had labs done through work recently.  Patient Counseling(The following topics were reviewed and/or handout was given):  -Nutrition: Stressed importance of moderation in sodium/caffeine intake, saturated fat and cholesterol, caloric balance, sufficient intake of fresh fruits, vegetables, and fiber.  -Stressed the importance of regular exercise.   -Substance Abuse: Discussed cessation/primary prevention of tobacco, alcohol, or other drug use; driving or other dangerous activities under the influence; availability of treatment for abuse.   -Injury prevention: Discussed safety belts, safety helmets, smoke detector, smoking near bedding or upholstery.   -Sexuality: Discussed sexually transmitted diseases, partner selection, use of condoms, avoidance of unintended pregnancy and contraceptive alternatives.   -Dental health: Discussed importance of regular tooth brushing, flossing, and dental visits.  -Health maintenance and immunizations reviewed. Please refer to Health maintenance section.  Return to care in 1 year for next preventative visit.     Subjective:   HPI:  He has no acute complaints today.   Had blood work done recently through work.  Notable for A1c 6.2.  He has been working on diet and exercise.  Lifestyle Diet: None specific Exercise: Will be retiring soon and is planning on being more active.   Depression screen PHQ 2/9 01/14/2021  Decreased Interest 0  Down, Depressed, Hopeless 0  PHQ - 2 Score 0    Health Maintenance Due  Topic Date Due   HIV Screening  Never done   Hepatitis C Screening  Never done   Zoster Vaccines- Shingrix (1 of 2) Never done   COVID-19 Vaccine (3 - Booster for Moderna series) 04/09/2019   Pneumonia Vaccine 28+ Years old (1 - PCV) Never done     ROS: Per HPI, otherwise a complete review of systems was negative.   PMH:  The following were reviewed and entered/updated in epic: Past Medical History:  Diagnosis Date   Arthritis    Hyperlipidemia    Patient Active Problem List   Diagnosis Date Noted   Former smoker 12/14/2019   Dyslipidemia 03/15/2018   Hyperglycemia 03/15/2018   Past Surgical History:  Procedure Laterality Date   TOTAL HIP ARTHROPLASTY     bilatera    Family History  Problem Relation Age of Onset   Heart disease Father    Cancer Mother    Lung cancer Sister        smoker   Colon cancer Neg Hx    Prostate cancer Neg Hx    Esophageal cancer Neg Hx    Rectal cancer Neg Hx     Medications- reviewed and  updated Current Outpatient Medications  Medication Sig Dispense Refill   aspirin 81 MG tablet Take 81 mg by mouth daily.     atorvastatin (LIPITOR) 40 MG tablet TAKE 1 TABLET(40 MG) BY MOUTH DAILY 90 tablet 0   No current facility-administered medications for this visit.    Allergies-reviewed and updated No Known Allergies  Social History   Socioeconomic History   Marital status: Married    Spouse name: Not on file   Number of children: Not on file   Years of education: Not on file   Highest education level: Not on file  Occupational History   Not on  file  Tobacco Use   Smoking status: Former    Packs/day: 0.50    Years: 40.00    Pack years: 20.00    Types: Cigarettes    Quit date: 2010    Years since quitting: 13.0   Smokeless tobacco: Never  Substance and Sexual Activity   Alcohol use: Yes    Alcohol/week: 5.0 - 10.0 standard drinks    Types: 5 - 10 Cans of beer per week    Comment: Beer   Drug use: Never   Sexual activity: Yes  Other Topics Concern   Not on file  Social History Narrative   Not on file   Social Determinants of Health   Financial Resource Strain: Not on file  Food Insecurity: Not on file  Transportation Needs: Not on file  Physical Activity: Not on file  Stress: Not on file  Social Connections: Not on file        Objective:  Physical Exam: BP 124/80    Pulse 79    Temp 97.9 F (36.6 C) (Temporal)    Ht 5\' 10"  (1.778 m)    Wt 215 lb 6.4 oz (97.7 kg)    SpO2 97%    BMI 30.91 kg/m   Body mass index is 30.91 kg/m. Wt Readings from Last 3 Encounters:  01/14/21 215 lb 6.4 oz (97.7 kg)  12/14/19 219 lb (99.3 kg)  08/11/18 220 lb (99.8 kg)   Gen: NAD, resting comfortably HEENT: TMs normal bilaterally. OP clear. No thyromegaly noted.  CV: RRR with no murmurs appreciated Pulm: NWOB, CTAB with no crackles, wheezes, or rhonchi GI: Normal bowel sounds present. Soft, Nontender, Nondistended. MSK: no edema, cyanosis, or clubbing noted Skin: warm, dry Neuro: CN2-12 grossly intact. Strength 5/5 in upper and lower extremities. Reflexes symmetric and intact bilaterally.  Psych: Normal affect and thought content     Taylie Helder M. Jerline Pain, MD 01/14/2021 9:17 AM

## 2021-01-20 ENCOUNTER — Telehealth: Payer: Self-pay | Admitting: Acute Care

## 2021-01-21 ENCOUNTER — Other Ambulatory Visit: Payer: Self-pay

## 2021-01-21 DIAGNOSIS — Z87891 Personal history of nicotine dependence: Secondary | ICD-10-CM

## 2021-01-21 NOTE — Telephone Encounter (Signed)
Called and spoke with patient. He was calling to see if it was time for him to have his annual lung cancer screening CT. I reviewed his chart and he is due on 01/29/21.   I advised him that I would send a message over to the lung nodule pool for him. He verbalized understanding.   Langley Gauss, can you please advise? Thanks!

## 2021-02-16 ENCOUNTER — Ambulatory Visit
Admission: RE | Admit: 2021-02-16 | Discharge: 2021-02-16 | Disposition: A | Discharge status: Home / Self Care | Payer: BC Managed Care – PPO | Source: Ambulatory Visit | Attending: Acute Care | Admitting: Acute Care

## 2021-02-16 DIAGNOSIS — Z87891 Personal history of nicotine dependence: Secondary | ICD-10-CM

## 2021-02-17 ENCOUNTER — Other Ambulatory Visit: Payer: Self-pay

## 2021-02-17 DIAGNOSIS — Z87891 Personal history of nicotine dependence: Secondary | ICD-10-CM

## 2021-03-13 ENCOUNTER — Other Ambulatory Visit (HOSPITAL_COMMUNITY): Payer: Self-pay

## 2021-03-13 MED ORDER — TRETINOIN 0.025 % EX CREA
TOPICAL_CREAM | CUTANEOUS | 12 refills | Status: DC
  Filled 2021-03-13: qty 45, 30d supply, fill #0

## 2021-04-15 ENCOUNTER — Telehealth: Payer: Self-pay

## 2021-04-15 ENCOUNTER — Other Ambulatory Visit (HOSPITAL_COMMUNITY): Payer: Self-pay

## 2021-04-15 ENCOUNTER — Other Ambulatory Visit: Payer: Self-pay | Admitting: *Deleted

## 2021-04-15 DIAGNOSIS — E785 Hyperlipidemia, unspecified: Secondary | ICD-10-CM

## 2021-04-15 MED ORDER — ATORVASTATIN CALCIUM 40 MG PO TABS
ORAL_TABLET | ORAL | 1 refills | Status: DC
  Filled 2021-04-15: qty 90, 90d supply, fill #0
  Filled 2021-07-21: qty 90, 90d supply, fill #1

## 2021-04-15 NOTE — Telephone Encounter (Signed)
Refills send to pharmacy  

## 2021-04-15 NOTE — Telephone Encounter (Signed)
MEDICATION: atorvastatin (LIPITOR) 40 MG tablet ? ?PHARMACY:  ?[facility] Phone:  [phone]  ?Fax:  [phone]  ?  ? ? ?Comments:  ? ?**Let patient know to contact pharmacy at the end of the day to make sure medication is ready. ** ? ?** Please notify patient to allow 48-72 hours to process** ? ?**Encourage patient to contact the pharmacy for refills or they can request refills through [facility]** ? ?

## 2021-04-18 ENCOUNTER — Other Ambulatory Visit: Payer: Self-pay | Admitting: Family Medicine

## 2021-04-18 DIAGNOSIS — E785 Hyperlipidemia, unspecified: Secondary | ICD-10-CM

## 2021-04-27 NOTE — Progress Notes (Signed)
? ? ?Subjective:   ? ?CC: Bilat knee pain ? ?I, Wendy Poet, LAT, ATC, am serving as scribe for Dr. Lynne Leader. ? ?HPI: Pt is a 66 y/o male c/o bilat knee pain. Pt reports R knee pain started about 3 weeks ago. Then, due to compensating his L knee starting hurting. He notes that today, his knees are actually feeling much better and describes the pain as only a "ache." He locates his pain to anterior-medial aspect of both knees.  ? ?He has recently retired and is looking to be more active.  ? ?Knee swelling: no ?Knee mechanical symptoms:  yes- slight ?Aggravating factors: nothing in particular ?Treatments tried: IBU, knee braces, ice, creams ? ?Pertinent review of Systems: no fever or chills ? ?Relevant historical information: Dyslipidemia. ? ? ?Objective:   ? ?Vitals:  ? 04/28/21 0836  ?BP: (!) 142/88  ?Pulse: 75  ?SpO2: 94%  ? ?General: Well Developed, well nourished, and in no acute distress.  ? ?MSK: Right knee: Decreased quad bulk and trace joint effusion present otherwise normal-appearing ?Normal motion without crepitation. ?Tender palpation medial joint line. ?Stable ligamentous exam. ?Intact strength. ?Positive McMurray's test. ? ?Left knee: Decreased quad bulk otherwise normal. ?Normal motion with mild crepitation. ?Mildly tender palpation medial joint line. ?Stable ligamentous exam. ?Positive McMurray's test. ?Intact strength. ? ? ? ?Lab and Radiology Results ? ?Diagnostic Limited MSK Ultrasound of: Right knee ?Quad tendon intact normal. ?Mild joint effusion present.  Patellar space. ?Patellar tendon normal. ?Lateral joint line normal. ?Medial joint line partially extruded with hypoechoic change mid substance meniscus consistent with possible partial tear. ?Posterior knee no Baker's cyst. 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  Mild joint effusion. ? ?X-ray images bilateral knees obtained today personally and independently interpreted ? 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  No acute fractures are present. ? ?Left knee: Mild medial compartment DJD.  Mild patellofemoral DJD.  Osteophyte present at medial femoral condyle.  No acute fractures are present. ? ?Await formal radiology review ? ? ?Impression and Recommendations:   ? ?Assessment and Plan: ?66 y.o. male with bilateral knee pain thought to be due to exacerbation of DJD.  He also likely has a underlying degenerative medial meniscus tear that may be contributory as well.  He is feeling a lot better today so is hard to recommend more aggressive treatment.  Recommend Voltaren gel and quad strengthening exercises and weight loss.  He will try it on his own at first but if needed physical therapy would be helpful.  I also discussed the potential for injections.  He can return to clinic as needed for injections in the future.  Recheck back as needed.  Precautions and options reviewed.. ? ?PDMP not reviewed this encounter. ?Orders Placed This Encounter  ?Procedures  ? [place] LIMITED JOINT SPACE STRUCTURES LOW BILAT(NO LINKED CHARGES)  ?  Order Specific Question:   Reason for Exam (SYMPTOM  OR DIAGNOSIS REQUIRED)  ?  Answer:   bilateral knee pain  ?  Order Specific Question:   Preferred imaging location?  ?  Answer:   [place]  ? DG Knee AP/LAT W/[place] Left  ?  Standing Status:   Future  ?  Standing Expiration Date:   05/28/2021  ?  Order Specific Question:   Reason for Exam (SYMPTOM  OR DIAGNOSIS REQUIRED)  ?  Answer:   bilateral knee pain  ?  Order Specific Question:   Preferred imaging location?  ?  Answer:  [place]  ? DG Knee AP/LAT W/[place] Right  ?  Standing Status:   Future  ?  Standing Expiration Date:   05/28/2021  ?  Order Specific Question:   Reason for Exam (SYMPTOM  OR DIAGNOSIS REQUIRED)  ?  Answer:   bilateral knee pain  ?  Order Specific Question:   Preferred imaging location?  ?  Answer:   [facility]  ? ?No orders of the defined types were placed in this  encounter. ? ? ?Discussed warning signs or symptoms. Please see discharge instructions. Patient expresses understanding. ? ? ?The above documentation has been reviewed and is accurate and complete Lynne Leader, M.D. ? ?

## 2021-04-28 ENCOUNTER — Ambulatory Visit: Payer: Self-pay

## 2021-04-28 ENCOUNTER — Ambulatory Visit: Payer: Medicare PPO | Admitting: Family Medicine

## 2021-04-28 ENCOUNTER — Ambulatory Visit (INDEPENDENT_AMBULATORY_CARE_PROVIDER_SITE_OTHER): Payer: Medicare PPO

## 2021-04-28 VITALS — BP 142/88 | HR 75 | Ht 70.0 in | Wt 221.4 lb

## 2021-04-28 DIAGNOSIS — M25562 Pain in left knee: Secondary | ICD-10-CM | POA: Diagnosis not present

## 2021-04-28 DIAGNOSIS — M25561 Pain in right knee: Secondary | ICD-10-CM

## 2021-04-28 NOTE — Progress Notes (Signed)
Left knee x-ray shows a little bit of arthritis.  No fractures are visible.

## 2021-04-28 NOTE — Patient Instructions (Addendum)
Thank you for coming in today.  ? ?Please get an Xray today before you leave  ? ?Please use Voltaren gel (Generic Diclofenac Gel) up to 4x daily for pain as needed.  This is available over-the-counter as both the name brand Voltaren gel and the generic diclofenac gel.  ? ?Recheck back as needed ?

## 2021-04-28 NOTE — Progress Notes (Signed)
Right knee x-ray shows a little bit of knee arthritis.

## 2021-05-11 ENCOUNTER — Other Ambulatory Visit (HOSPITAL_COMMUNITY): Payer: Self-pay

## 2021-05-11 MED ORDER — METHOCARBAMOL 500 MG PO TABS
ORAL_TABLET | ORAL | 0 refills | Status: DC
  Filled 2021-05-11: qty 30, 10d supply, fill #0

## 2021-05-14 ENCOUNTER — Other Ambulatory Visit (HOSPITAL_COMMUNITY): Payer: Self-pay

## 2021-05-14 MED ORDER — METHOCARBAMOL 500 MG PO TABS
ORAL_TABLET | ORAL | 0 refills | Status: DC
  Filled 2021-05-14: qty 180, 30d supply, fill #0

## 2021-05-15 ENCOUNTER — Other Ambulatory Visit (HOSPITAL_COMMUNITY): Payer: Self-pay

## 2021-06-11 NOTE — Progress Notes (Unsigned)
   I, Wendy Poet, LAT, ATC, am serving as scribe for Dr. Lynne Leader.  Chad Manning is a 66 y.o. male who presents to [place] at [facility] today for R shoulder pain.  He was last seen by Dr. Georgina Snell on 04/28/21 for B knee pain.  Today, pt reports R shoulder pain x .  He locates his pain to .  Radiating pain: R shoulder mechanical symptoms: Aggravating factors: Treatments tried:    Pertinent review of systems: ***  Relevant historical information: ***   Exam:  There were no vitals taken for this visit. General: Well Developed, well nourished, and in no acute distress.   MSK: ***    Lab and Radiology Results No results found for this or any previous visit (from the past 72 hour(s)). No results found.     Assessment and Plan: 66 y.o. male with ***   PDMP not reviewed this encounter. No orders of the defined types were placed in this encounter.  No orders of the defined types were placed in this encounter.    Discussed warning signs or symptoms. Please see discharge instructions. Patient expresses understanding.   ***

## 2021-06-12 ENCOUNTER — Ambulatory Visit (INDEPENDENT_AMBULATORY_CARE_PROVIDER_SITE_OTHER): Payer: Medicare PPO

## 2021-06-12 ENCOUNTER — Ambulatory Visit: Payer: Medicare PPO | Admitting: Family Medicine

## 2021-06-12 VITALS — BP 130/84 | HR 78 | Ht 70.0 in | Wt 215.0 lb

## 2021-06-12 DIAGNOSIS — M25511 Pain in right shoulder: Secondary | ICD-10-CM

## 2021-06-12 NOTE — Patient Instructions (Addendum)
Thank you for coming in today.   Please get an Xray today before you leave   You should hear from MRI scheduling within 1 week. If you do not hear please let me know.    Check back after MRI

## 2021-06-15 NOTE — Progress Notes (Signed)
Right shoulder shows a shoulder separation (AC sprain).  No fractures are visible.  This should get better with a little bit of time.

## 2021-06-20 ENCOUNTER — Ambulatory Visit (INDEPENDENT_AMBULATORY_CARE_PROVIDER_SITE_OTHER): Payer: Medicare PPO

## 2021-06-20 DIAGNOSIS — M25511 Pain in right shoulder: Secondary | ICD-10-CM

## 2021-06-22 ENCOUNTER — Telehealth: Payer: Self-pay

## 2021-06-22 NOTE — Telephone Encounter (Signed)
Noted  

## 2021-06-22 NOTE — Telephone Encounter (Signed)
Patient called to make an appointment for MRI review we made it for tomorrow at 3, the images has not come in yet but patient wanted to schedule. If they are not in by tomorrow before his appointment patient would like a call so he can reschedule

## 2021-06-23 ENCOUNTER — Ambulatory Visit: Payer: Medicare PPO | Admitting: Family Medicine

## 2021-06-23 ENCOUNTER — Telehealth: Payer: Self-pay | Admitting: Family Medicine

## 2021-06-23 ENCOUNTER — Encounter: Payer: Self-pay | Admitting: Family Medicine

## 2021-06-23 VITALS — BP 120/78 | HR 75 | Ht 70.0 in | Wt 215.0 lb

## 2021-06-23 DIAGNOSIS — M12811 Other specific arthropathies, not elsewhere classified, right shoulder: Secondary | ICD-10-CM

## 2021-06-23 DIAGNOSIS — M75101 Unspecified rotator cuff tear or rupture of right shoulder, not specified as traumatic: Secondary | ICD-10-CM | POA: Diagnosis not present

## 2021-06-23 DIAGNOSIS — M25511 Pain in right shoulder: Secondary | ICD-10-CM

## 2021-06-23 NOTE — Telephone Encounter (Signed)
MRI results are back.  Pt ok to be seen today.

## 2021-06-23 NOTE — Patient Instructions (Addendum)
Good to see you today.  I've referred you to orthopedic surgery.  Their office will call you to schedule but please let us know if you don't hear from them in one week regarding scheduling.  Dr Eddie Dibbles number is [phone].   Follow-up: as needed.

## 2021-06-23 NOTE — Telephone Encounter (Signed)
Referral to orthopedic surgery placed today. 

## 2021-06-23 NOTE — Progress Notes (Signed)
I, Chad Manning, LAT, ATC, am serving as scribe for Dr. Lynne Leader.  Chad Manning is a 66 y.o. male who presents to [facility] today for f/u R shoulder pain, concerning for a RC tear, and MRI review. On 5/26, pt slipped going down the steps, R arm extended trying to catch himself. Pt was last seen by Dr. Georgina Snell on 06/12/21 and was advised to proceed to MRI. Today, pt reports that his R shoulder remains about the same.  Generally it's not too bad but he is lacking some strength and has to use a compensatory pattern to raise his R arm overhead.    Dx imaging: 06/20/21 R shoulder MRI  06/12/21 R shoulder XR  Pertinent review of systems: No fevers or chills  Relevant historical information: Dyslipidemia.  Hyperglycemia.   Exam:  BP 120/78 (BP Location: Right Arm, Patient Position: Sitting, Cuff Size: Normal)   Pulse 75   Ht '5\' 10"'$  (1.778 m)   Wt 215 lb (97.5 kg)   SpO2 95%   BMI 30.85 kg/m  General: Well Developed, well nourished, and in no acute distress.   MSK: Right shoulder: Decreased range of motion.    Lab and Radiology Results No results found for this or any previous visit (from the past 72 hour(s)). MR SHOULDER RIGHT WO CONTRAST  Result Date: 06/22/2021 CLINICAL DATA:  Right shoulder pain for 2 weeks after injury. EXAM: MRI OF THE RIGHT SHOULDER WITHOUT CONTRAST TECHNIQUE: Multiplanar, multisequence MR imaging of the shoulder was performed. No intravenous contrast was administered. COMPARISON:  06/12/2021 FINDINGS: Despite efforts by the technologist and patient, motion artifact is present on today's exam and could not be eliminated. Rotator cuff: Full-thickness partial width tear of the supraspinatus tendon primarily anteriorly. Full-thickness suspected full width rupture of the subscapularis tendon distally with 2.9 cm retraction. Moderate infraspinatus tendinopathy. Muscles:  No current significant muscular edema or atrophy. Biceps long head:  Mild tendinopathy, with substantial medial dislocation of the intra-articular segment. Acromioclavicular Joint: Moderate spurring mild subcortical marrow edema. Type II acromion. As expected, there is fluid in the subacromial subdeltoid bursa. Glenohumeral Joint: Moderate degenerative chondral thinning. Ill definition of the anterior band of the inferior glenohumeral ligament at the humeral attachment, tear not excluded. Labrum:  Unremarkable Bones:  Unremarkable Other: No supplemental non-categorized findings. IMPRESSION: 1. Full-thickness full width rupture of the subscapularis tendon with 2.9 cm retraction. Full-thickness partial width tearing of the supraspinatus tendon anteriorly. 2. Moderate infraspinatus tendinopathy. 3. Medial dislocation of the intra-articular long head of the biceps. 4. Mild biceps tendinopathy. 5. Indistinctness of the humeral attachment of the anterior band of the inferior glenohumeral ligament which could be torn. 6. Moderate degenerative glenohumeral chondral thinning. 7. Moderate spurring of the AC joint. Electronically Signed   By: Van Clines M.D.   On: 06/22/2021 16:09   I, Lynne Leader, personally (independently) visualized and performed the interpretation of the images attached in this note.     Assessment and Plan: 66 y.o. 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  We reviewed the MRI report as well as imaging.  Spent time discussion options.  I think at this point surgical consultation is warranted and have already placed a surgical referral.  I referred to [facility] and he has an appointment scheduled with Dr. Marlou Sa on June 21.  His wife is a Marine scientist and she asked about some other surgical options  including Dr. Sammuel Hines.  I stated that both would be great surgeons and I think he would be in good hands with their surgeon.  Provided contact information for both surgical  clinics. I think he would benefit from a detailed surgical consultation to discuss surgical options as well as all the details of the surgery itself.    Total encounter time 30 minutes including face-to-face time with the patient and, reviewing past medical record, and charting on the date of service.   Detailed review of the MRI report as well as imaging as well as discussion of options.    Discussed warning signs or symptoms. Please see discharge instructions. Patient expresses understanding.   The above documentation has been reviewed and is accurate and complete Lynne Leader, M.D.

## 2021-06-23 NOTE — Progress Notes (Signed)
MRI shows a full-thickness tear of the subscapularis rotator cuff tendon and a partial rotator cuff tear of the supraspinatus rotator cuff tendon.  There are other injuries in the shoulder as well.  This should have a surgical evaluation.  I have referred you to orthopedic surgery to discuss options.  You are scheduled to see me today.  We can talk about the MRI results in full detail during that visit.

## 2021-06-25 ENCOUNTER — Telehealth: Payer: Self-pay | Admitting: *Deleted

## 2021-06-25 DIAGNOSIS — R2681 Unsteadiness on feet: Secondary | ICD-10-CM

## 2021-06-25 DIAGNOSIS — M75101 Unspecified rotator cuff tear or rupture of right shoulder, not specified as traumatic: Secondary | ICD-10-CM

## 2021-06-25 NOTE — Telephone Encounter (Signed)
Pt called requesting a rx for a lift chair for when he has surgery. He has an appt with the surgeon next week to schedule the surgery.

## 2021-06-25 NOTE — Telephone Encounter (Signed)
It is likely that I will be asked to justify why you need a lift chair.  Can you please give me a little more context for the medical necessity of a lift chair for a rotator cuff surgery.  Generally if you have trouble standing because you have a leg problem we can get it justify but I am concerned that he may have difficulty justifying the use of a lift chair following a rotator cuff surgery.  I can write the order but I want to be ready with a justification so please give me a little more information.

## 2021-06-26 NOTE — Telephone Encounter (Signed)
Called pt and inquired about need for lift chair after impending shoulder RC surgery.  Pt states that since he's been having L hip and B knee pain recently and has a hx of 2 previous THA surgeries, he is concerned about getting into/out of a chair after shoulder surgery since he won't be able to use his arm to help push off of the chair to stand up.  Pt states that he will using [facility] supply to get said lift chair.  Please advise and/or write an rx for a lift chair.

## 2021-06-29 MED ORDER — AMBULATORY NON FORMULARY MEDICATION: 0 refills | Status: DC

## 2021-06-29 NOTE — Telephone Encounter (Signed)
The prescription for a lift chair is printed.  It is available to pick up at the front desk.  We can email it to you or get it to you some other way.  What is the best way for [place] to get the lift chair to you?

## 2021-06-29 NOTE — Telephone Encounter (Signed)
Spoke with pt, will p/u lift chair RX at office today.

## 2021-07-01 ENCOUNTER — Ambulatory Visit: Payer: Medicare PPO | Admitting: Orthopedic Surgery

## 2021-07-01 ENCOUNTER — Ambulatory Visit (HOSPITAL_BASED_OUTPATIENT_CLINIC_OR_DEPARTMENT_OTHER): Payer: Medicare PPO | Admitting: Orthopaedic Surgery

## 2021-07-01 ENCOUNTER — Ambulatory Visit (HOSPITAL_BASED_OUTPATIENT_CLINIC_OR_DEPARTMENT_OTHER): Payer: Self-pay | Admitting: Orthopaedic Surgery

## 2021-07-01 ENCOUNTER — Other Ambulatory Visit (HOSPITAL_BASED_OUTPATIENT_CLINIC_OR_DEPARTMENT_OTHER): Payer: Self-pay

## 2021-07-01 DIAGNOSIS — S46011A Strain of muscle(s) and tendon(s) of the rotator cuff of right shoulder, initial encounter: Secondary | ICD-10-CM

## 2021-07-01 MED ORDER — ASPIRIN 325 MG PO TBEC
325.0000 mg | DELAYED_RELEASE_TABLET | Freq: Every day | ORAL | 0 refills | Status: DC
  Filled 2021-07-01: qty 30, 30d supply, fill #0

## 2021-07-01 MED ORDER — OXYCODONE HCL 5 MG PO TABS
5.0000 mg | ORAL_TABLET | ORAL | 0 refills | Status: DC | PRN
  Filled 2021-07-01: qty 20, 4d supply, fill #0

## 2021-07-01 MED ORDER — ACETAMINOPHEN 500 MG PO TABS
500.0000 mg | ORAL_TABLET | Freq: Three times a day (TID) | ORAL | 0 refills | Status: AC
  Filled 2021-07-01: qty 30, 10d supply, fill #0

## 2021-07-01 MED ORDER — IBUPROFEN 800 MG PO TABS
800.0000 mg | ORAL_TABLET | Freq: Three times a day (TID) | ORAL | 0 refills | Status: DC
  Filled 2021-07-01 – 2021-07-02 (×2): qty 30, 10d supply, fill #0

## 2021-07-01 NOTE — Progress Notes (Signed)
Chief Complaint: Right shoulder pain     History of Present Illness:    Chad Manning is a 66 y.o. male presents today for follow-up of the known right shoulder injury.  He had a fall down steps on May 29 while [place] the beach.  He does state that he felt like the shoulder possibly popped out.  Since that time he has had limited overhead motion with significant weakness.  He was seen by Dr. Georgina Snell who recommended an MRI given his acute weakness was found to have a full-thickness rotator cuff tear.  He has been working on wall climbs and gentle passive range of motion since this happened.  He has not had formal physical therapy.  He has not had any injections.  He is right-hand dominant.  He does enjoy being active and goes to the gym.  He and his spare time as a young granddaughter as well as enjoys working on cars.  He denies any deficits with the shoulder prior to this recent injury.   Surgical History:   None with regard to the shoulder  PMH/PSH/Family History/Social History/Meds/Allergies:    Past Medical History:  Diagnosis Date   Arthritis    Hyperlipidemia    Past Surgical History:  Procedure Laterality Date   TOTAL HIP ARTHROPLASTY     bilatera   Social History   Socioeconomic History   Marital status: Married    Spouse name: Not on file   Number of children: Not on file   Years of education: Not on file   Highest education level: Not on file  Occupational History   Not on file  Tobacco Use   Smoking status: Former    Packs/day: 0.50    Years: 40.00    Total pack years: 20.00    Types: Cigarettes    Quit date: 2010    Years since quitting: 13.4   Smokeless tobacco: Never  Substance and Sexual Activity   Alcohol use: Yes    Alcohol/week: 5.0 - 10.0 standard drinks of alcohol    Types: 5 - 10 Cans of beer per week    Comment: Beer   Drug use: Never   Sexual activity: Yes  Other Topics Concern   Not on file  Social  History Narrative   Not on file   Social Determinants of Health   Financial Resource Strain: Not on file  Food Insecurity: Not on file  Transportation Needs: Not on file  Physical Activity: Not on file  Stress: Not on file  Social Connections: Not on file   Family History  Problem Relation Age of Onset   Heart disease Father    Cancer Mother    Lung cancer Sister        smoker   Colon cancer Neg Hx    Prostate cancer Neg Hx    Esophageal cancer Neg Hx    Rectal cancer Neg Hx    No Known Allergies Current Outpatient Medications  Medication Sig Dispense Refill   AMBULATORY NON FORMULARY MEDICATION Lift chair.  Dispense 1.  R26.81. 1 each 0   atorvastatin (LIPITOR) 40 MG tablet TAKE 1 TABLET(40 MG) BY MOUTH DAILY 90 tablet 1   methocarbamol (ROBAXIN) 500 MG tablet Take 2 tablets by mouth every 8 hours as needed for 30 days. [place]  tablet 0   No current facility-administered medications for this visit.   No results found.  Review of Systems:   A ROS was performed including pertinent positives and negatives as documented in the HPI.  Physical Exam :   Constitutional: NAD and appears stated age Neurological: Alert and oriented Psych: Appropriate affect and cooperative There were no vitals taken for this visit.   Comprehensive Musculoskeletal Exam:    Musculoskeletal Exam    Inspection Right Left  Skin No atrophy or winging No atrophy or winging  Palpation    Tenderness Lateral deltoid None  Range of Motion    Flexion (passive) 150 170  Flexion (active) 150 170  Abduction 140 170  ER at the side 45 70  Can reach behind back to Back pocket T12  Strength     4 out of 5 supraspinatus as well as subscapularis with positive belly press None  Special Tests    Pseudoparalytic No No  Neurologic    Fires PIN, radial, median, ulnar, musculocutaneous, axillary, suprascapular, long thoracic, and spinal accessory innervated muscles. No abnormal sensibility  Vascular/Lymphatic     Radial Pulse 2+ 2+  Cervical Exam    Patient has symmetric cervical range of motion with negative Spurling's test.  Special Test:      Imaging:   Xray (right shoulder 3 views): Mild AC joint arthritis  MRI (right shoulder): There is a full-thickness tear involving the supra and infraspinatus as well as a full-thickness tear of the subscapularis muscle.  There is biceps subluxation.  Negative tangent sign the sagittal view of the MRI.  I personally reviewed and interpreted the radiographs.   Assessment:   66 y.o. male right-hand-dominant presents with an acute right shoulder rotator cuff tear after a fall down several steps holding the arm caught.  We did discuss that because this is an acute tear and that he did not have any deficits in the right shoulder prior to this injury I do think that he would ultimately do well with operative repair.  I did discuss with him the natural progression of rotator cuff tears can ultimately progress if not treated.  Also possible he may be able to restore his strength with physical therapy he is currently quite limited behind the back as a result of the subscapularis tear.  We discussed conservative options including physical therapy or steroid injections although given the fact that this tear did happen acutely from an injury I do believe that he would likely do best with operative repair.  I discussed that he does have a subscapularis tear with some biceps subluxation and as result I would plan on repairing this in addition to performing a biceps tenodesis.  At this time after discussion of treatment options he has elected for right shoulder arthroscopy with rotator cuff repair biceps tenodesis.  Plan :    -Plan for right shoulder arthroscopy with rotator cuff repair and biceps tenodesis   After a lengthy discussion of treatment options, including risks, benefits, alternatives, complications of surgical and nonsurgical conservative options, the patient  elected surgical repair.   The patient  is aware of the material risks  and complications including, but not limited to injury to adjacent structures, neurovascular injury, infection, numbness, bleeding, implant failure, thermal burns, stiffness, persistent pain, failure to heal, disease transmission from allograft, need for further surgery, dislocation, anesthetic risks, blood clots, risks of death,and others. The probabilities of surgical success and failure discussed with patient given their particular co-morbidities.The time and  nature of expected rehabilitation and recovery was discussed.The patient's questions were all answered preoperatively.  No barriers to understanding were noted. I explained the natural history of the disease process and Rx rationale.  I explained to the patient what I considered to be reasonable expectations given their personal situation.  The final treatment plan was arrived at through a shared patient decision making process model.      I personally saw and evaluated the patient, and participated in the management and treatment plan.  Vanetta Mulders, MD Attending Physician, Orthopedic Surgery  This document was dictated using Dragon voice recognition software. A reasonable attempt at proof reading has been made to minimize errors.

## 2021-07-01 NOTE — H&P (View-Only) (Signed)
Chief Complaint: Right shoulder pain     History of Present Illness:    Chad Manning is a 66 y.o. male presents today for follow-up of the known right shoulder injury.  He had a fall down steps on May 29 while [place] was [place] the beach.  He does state that he felt like the shoulder possibly popped out.  Since that time he has had limited overhead motion with significant weakness.  He was seen by Dr. Georgina Snell who recommended an MRI given his acute weakness was found to have a full-thickness rotator cuff tear.  He has been working on wall climbs and gentle passive range of motion since this happened.  He has not had formal physical therapy.  He has not had any injections.  He is right-hand dominant.  He does enjoy being active and goes to the gym.  He and his spare time as a young granddaughter as well as enjoys working on cars.  He denies any deficits with the shoulder prior to this recent injury.   Surgical History:   None with regard to the shoulder  PMH/PSH/Family History/Social History/Meds/Allergies:    Past Medical History:  Diagnosis Date   Arthritis    Hyperlipidemia    Past Surgical History:  Procedure Laterality Date   TOTAL HIP ARTHROPLASTY     bilatera   Social History   Socioeconomic History   Marital status: Married    Spouse name: Not on file   Number of children: Not on file   Years of education: Not on file   Highest education level: Not on file  Occupational History   Not on file  Tobacco Use   Smoking status: Former    Packs/day: 0.50    Years: 40.00    Total pack years: 20.00    Types: Cigarettes    Quit date: 2010    Years since quitting: 13.4   Smokeless tobacco: Never  Substance and Sexual Activity   Alcohol use: Yes    Alcohol/week: 5.0 - 10.0 standard drinks of alcohol    Types: 5 - 10 Cans of beer per week    Comment: Beer   Drug use: Never   Sexual activity: Yes  Other Topics Concern   Not on file  Social  History Narrative   Not on file   Social Determinants of Health   Financial Resource Strain: Not on file  Food Insecurity: Not on file  Transportation Needs: Not on file  Physical Activity: Not on file  Stress: Not on file  Social Connections: Not on file   Family History  Problem Relation Age of Onset   Heart disease Father    Cancer Mother    Lung cancer Sister        smoker   Colon cancer Neg Hx    Prostate cancer Neg Hx    Esophageal cancer Neg Hx    Rectal cancer Neg Hx    No Known Allergies Current Outpatient Medications  Medication Sig Dispense Refill   AMBULATORY NON FORMULARY MEDICATION Lift chair.  Dispense 1.  R26.81. 1 each 0   atorvastatin (LIPITOR) 40 MG tablet TAKE 1 TABLET(40 MG) BY MOUTH DAILY 90 tablet 1   methocarbamol (ROBAXIN) 500 MG tablet Take 2 tablets by mouth every 8 hours as needed for 30 days. [place]  tablet 0   No current facility-administered medications for this visit.   No results found.  Review of Systems:   A ROS was performed including pertinent positives and negatives as documented in the HPI.  Physical Exam :   Constitutional: NAD and appears stated age Neurological: Alert and oriented Psych: Appropriate affect and cooperative There were no vitals taken for this visit.   Comprehensive Musculoskeletal Exam:    Musculoskeletal Exam    Inspection Right Left  Skin No atrophy or winging No atrophy or winging  Palpation    Tenderness Lateral deltoid None  Range of Motion    Flexion (passive) 150 170  Flexion (active) 150 170  Abduction 140 170  ER at the side 45 70  Can reach behind back to Back pocket T12  Strength     4 out of 5 supraspinatus as well as subscapularis with positive belly press None  Special Tests    Pseudoparalytic No No  Neurologic    Fires PIN, radial, median, ulnar, musculocutaneous, axillary, suprascapular, long thoracic, and spinal accessory innervated muscles. No abnormal sensibility  Vascular/Lymphatic     Radial Pulse 2+ 2+  Cervical Exam    Patient has symmetric cervical range of motion with negative Spurling's test.  Special Test:      Imaging:   Xray (right shoulder 3 views): Mild AC joint arthritis  MRI (right shoulder): There is a full-thickness tear involving the supra and infraspinatus as well as a full-thickness tear of the subscapularis muscle.  There is biceps subluxation.  Negative tangent sign the sagittal view of the MRI.  I personally reviewed and interpreted the radiographs.   Assessment:   66 y.o. male right-hand-dominant presents with an acute right shoulder rotator cuff tear after a fall down several steps holding the arm caught.  We did discuss that because this is an acute tear and that he did not have any deficits in the right shoulder prior to this injury I do think that he would ultimately do well with operative repair.  I did discuss with him the natural progression of rotator cuff tears can ultimately progress if not treated.  Also possible he may be able to restore his strength with physical therapy he is currently quite limited behind the back as a result of the subscapularis tear.  We discussed conservative options including physical therapy or steroid injections although given the fact that this tear did happen acutely from an injury I do believe that he would likely do best with operative repair.  I discussed that he does have a subscapularis tear with some biceps subluxation and as result I would plan on repairing this in addition to performing a biceps tenodesis.  At this time after discussion of treatment options he has elected for right shoulder arthroscopy with rotator cuff repair biceps tenodesis.  Plan :    -Plan for right shoulder arthroscopy with rotator cuff repair and biceps tenodesis   After a lengthy discussion of treatment options, including risks, benefits, alternatives, complications of surgical and nonsurgical conservative options, the patient  elected surgical repair.   The patient  is aware of the material risks  and complications including, but not limited to injury to adjacent structures, neurovascular injury, infection, numbness, bleeding, implant failure, thermal burns, stiffness, persistent pain, failure to heal, disease transmission from allograft, need for further surgery, dislocation, anesthetic risks, blood clots, risks of death,and others. The probabilities of surgical success and failure discussed with patient given their particular co-morbidities.The time and  nature of expected rehabilitation and recovery was discussed.The patient's questions were all answered preoperatively.  No barriers to understanding were noted. I explained the natural history of the disease process and Rx rationale.  I explained to the patient what I considered to be reasonable expectations given their personal situation.  The final treatment plan was arrived at through a shared patient decision making process model.      I personally saw and evaluated the patient, and participated in the management and treatment plan.  Vanetta Mulders, MD Attending Physician, Orthopedic Surgery  This document was dictated using Dragon voice recognition software. A reasonable attempt at proof reading has been made to minimize errors.

## 2021-07-02 ENCOUNTER — Other Ambulatory Visit (HOSPITAL_BASED_OUTPATIENT_CLINIC_OR_DEPARTMENT_OTHER): Payer: Self-pay

## 2021-07-03 ENCOUNTER — Encounter (HOSPITAL_BASED_OUTPATIENT_CLINIC_OR_DEPARTMENT_OTHER): Payer: Self-pay | Admitting: Orthopaedic Surgery

## 2021-07-03 ENCOUNTER — Other Ambulatory Visit: Payer: Self-pay

## 2021-07-07 ENCOUNTER — Ambulatory Visit (HOSPITAL_BASED_OUTPATIENT_CLINIC_OR_DEPARTMENT_OTHER): Admission: RE | Admit: 2021-07-07 | Payer: Medicare PPO | Source: Ambulatory Visit | Admitting: Orthopaedic Surgery

## 2021-07-07 ENCOUNTER — Telehealth: Payer: Self-pay | Admitting: Orthopaedic Surgery

## 2021-07-07 DIAGNOSIS — Z01818 Encounter for other preprocedural examination: Secondary | ICD-10-CM

## 2021-07-07 HISTORY — DX: Unspecified rotator cuff tear or rupture of right shoulder, not specified as traumatic: M75.101

## 2021-07-07 SURGERY — SHOULDER ARTHROSCOPY WITH ROTATOR CUFF REPAIR AND SUBACROMIAL DECOMPRESSION
Anesthesia: Choice | Laterality: Right

## 2021-07-09 ENCOUNTER — Telehealth: Payer: Self-pay | Admitting: Orthopaedic Surgery

## 2021-07-09 ENCOUNTER — Telehealth: Payer: Self-pay | Admitting: *Deleted

## 2021-07-09 NOTE — Telephone Encounter (Signed)
FYI: Patient has an appointment tomorrow with a GP for clearance. Patient was informed to have GP office fax form back to [place], but also call me directly if clearance is given so I can get him on the schedule ASAP. I have reserved OR time for Monday 7/3, in case clearance is given.

## 2021-07-09 NOTE — Telephone Encounter (Signed)
Pt is scheduled with provider at [facility] on 07/10/21 because PCP is on leave.

## 2021-07-09 NOTE — Telephone Encounter (Signed)
Called patient to schedule an OV for surgical clearance. Patient expresses confusion due to orthopedics office informing him that they would contact his PCP but never told him a surgical clearance visit would be needed. According to patient, he was only unable to have his surgery completed on 07/07/21 due to his insurance company not signing off on a PA. Although I tried to get patient scheduled with PCP, he stated he can not wait another couple of weeks to have surgery. He informed me he will be calling his surgeon for more information.

## 2021-07-09 NOTE — Telephone Encounter (Signed)
Patient called in needing to follow up with the dr being her got a call stating he needed an appt first he is wanting to discuss the needing of a primary doctor clearance? Please advise

## 2021-07-09 NOTE — Telephone Encounter (Signed)
Patient need OV for surgery clearance

## 2021-07-10 ENCOUNTER — Encounter: Payer: Self-pay | Admitting: Family Medicine

## 2021-07-10 ENCOUNTER — Other Ambulatory Visit: Payer: Self-pay

## 2021-07-10 ENCOUNTER — Encounter (HOSPITAL_COMMUNITY): Payer: Self-pay | Admitting: Orthopaedic Surgery

## 2021-07-10 ENCOUNTER — Ambulatory Visit (INDEPENDENT_AMBULATORY_CARE_PROVIDER_SITE_OTHER): Payer: Medicare PPO | Admitting: Family Medicine

## 2021-07-10 VITALS — BP 138/80 | HR 75 | Temp 97.8°F | Ht 70.0 in | Wt 209.5 lb

## 2021-07-10 DIAGNOSIS — E785 Hyperlipidemia, unspecified: Secondary | ICD-10-CM

## 2021-07-10 DIAGNOSIS — Z01818 Encounter for other preprocedural examination: Secondary | ICD-10-CM | POA: Diagnosis not present

## 2021-07-10 NOTE — Progress Notes (Addendum)
Subjective:     Patient ID: Chad Manning, male    DOB: [birth year], 66 y.o.   MRN: [id number]  Chief Complaint  Patient presents with   Surgical Clearance    Having surgery on shoulder from a fall in May    HPI [place] in May 26th-tore R rotator cuff-needs clearance.  This is an addendum/correction to original note where I had mistyped the date as May 29th and mentioned knees injected  HLD-on meds.  No problems.  Donates blood regularly.  Was walking regularly till knees started bothering him.no cp/sob w/stairs.  Active.    Health Maintenance Due  Topic Date Due   Hepatitis C Screening  Never done    Past Medical History:  Diagnosis Date   Arthritis    Hyperlipidemia    Right rotator cuff tear     Past Surgical History:  Procedure Laterality Date   COLONOSCOPY     JOINT REPLACEMENT     TOTAL HIP ARTHROPLASTY     bilatera    Outpatient Medications Prior to Visit  Medication Sig Dispense Refill   acetaminophen (TYLENOL) 500 MG tablet Take 1 tablet (500 mg total) by mouth every 8 (eight) hours for 10 days. 30 tablet 0   AMBULATORY NON FORMULARY MEDICATION Lift chair.  Dispense 1.  R26.81. 1 each 0   aspirin EC 325 MG tablet Take 1 tablet (325 mg total) by mouth daily. 30 tablet 0   atorvastatin (LIPITOR) 40 MG tablet TAKE 1 TABLET(40 MG) BY MOUTH DAILY 90 tablet 1   docusate sodium (COLACE) 100 MG capsule Take 100 mg by mouth 2 (two) times daily.     ibuprofen (ADVIL) 800 MG tablet Take 1 tablet (800 mg total) by mouth every 8 (eight) hours for 10 days. Please take with food, please alternate with acetaminophen (Patient not taking: Reported on 07/10/2021) 30 tablet 0   methocarbamol (ROBAXIN) 500 MG tablet Take 2 tablets by mouth every 8 hours as needed for 30 days. (Patient not taking: Reported on 07/10/2021) 180 tablet 0   oxyCODONE (OXY IR/ROXICODONE) 5 MG immediate release tablet Take 1 tablet (5 mg total) by mouth every 4 (four) hours as needed (severe pain). (Patient not  taking: Reported on 07/10/2021) 20 tablet 0   No facility-administered medications prior to visit.    No Known Allergies ROS  ROS: Gen: no fever, chills  Skin: no rash, itching ENT: no ear pain, ear drainage, nasal congestion, rhinorrhea, sinus pressure, sore throat Eyes: no blurry vision, double vision Resp: no cough, wheeze,SOB CV: no CP, palpitations, LE edema,  GI: no heartburn, n/v/d/c, abd pain GU: no dysuria, urgency, frequency, hematuria MSK:HPI Neuro: no dizziness, headache, weakness, vertigo Psych: no depression, anxiety, insomnia, SI       Objective:     BP 138/80   Pulse 75   Temp 97.8 F (36.6 C) (Temporal)   Ht '5\' 10"'$  (1.778 m)   Wt 209 lb 8 oz (95 kg)   SpO2 96%   BMI 30.06 kg/m  Wt Readings from Last 3 Encounters:  07/10/21 209 lb 8 oz (95 kg)  06/23/21 215 lb (97.5 kg)  06/12/21 215 lb (97.5 kg)    Physical Exam   Gen: WDWN NAD WM HEENT: NCAT, conjunctiva not injected, sclera nonicteric NECK:  supple, no thyromegaly, no nodes, no carotid bruits CARDIAC: RRR, S1S2+, no murmur. DP 2+B LUNGS: CTAB. No wheezes ABDOMEN:  BS+, soft, NTND, No HSM, no masses EXT:  no edema MSK: no gross  abnormalities.  NEURO: A&O x3.  CN II-XII intact.  PSYCH: normal mood. Good eye contact     Assessment & Plan:   Problem List Items Addressed This Visit       Other   Dyslipidemia   Other Visit Diagnoses     Preoperative clearance    -  Primary      Pre-op clearance for R rotator cuff repair-ok for surgery.  Low risk HLD-chronic.  Cont meds.    No orders of the defined types were placed in this encounter.   Wellington Hampshire, MD

## 2021-07-13 ENCOUNTER — Other Ambulatory Visit: Payer: Self-pay

## 2021-07-13 ENCOUNTER — Encounter (HOSPITAL_COMMUNITY): Payer: Self-pay | Admitting: Orthopaedic Surgery

## 2021-07-13 ENCOUNTER — Encounter (HOSPITAL_COMMUNITY)
Admission: RE | Disposition: A | Discharge status: Home / Self Care | Payer: Self-pay | Source: Home / Self Care | Attending: Orthopaedic Surgery

## 2021-07-13 ENCOUNTER — Ambulatory Visit (HOSPITAL_BASED_OUTPATIENT_CLINIC_OR_DEPARTMENT_OTHER): Payer: Medicare PPO | Admitting: Anesthesiology

## 2021-07-13 ENCOUNTER — Ambulatory Visit (HOSPITAL_COMMUNITY)
Admission: RE | Admit: 2021-07-13 | Discharge: 2021-07-13 | Disposition: A | Discharge status: Home / Self Care | Payer: Medicare PPO | Attending: Orthopaedic Surgery | Admitting: Orthopaedic Surgery

## 2021-07-13 ENCOUNTER — Ambulatory Visit (HOSPITAL_COMMUNITY): Payer: Medicare PPO | Admitting: Anesthesiology

## 2021-07-13 DIAGNOSIS — J45909 Unspecified asthma, uncomplicated: Secondary | ICD-10-CM | POA: Insufficient documentation

## 2021-07-13 DIAGNOSIS — M7541 Impingement syndrome of right shoulder: Secondary | ICD-10-CM

## 2021-07-13 DIAGNOSIS — M25811 Other specified joint disorders, right shoulder: Secondary | ICD-10-CM | POA: Insufficient documentation

## 2021-07-13 DIAGNOSIS — M19011 Primary osteoarthritis, right shoulder: Secondary | ICD-10-CM | POA: Diagnosis not present

## 2021-07-13 DIAGNOSIS — S46011A Strain of muscle(s) and tendon(s) of the rotator cuff of right shoulder, initial encounter: Secondary | ICD-10-CM

## 2021-07-13 DIAGNOSIS — M7521 Bicipital tendinitis, right shoulder: Secondary | ICD-10-CM | POA: Insufficient documentation

## 2021-07-13 DIAGNOSIS — Z79899 Other long term (current) drug therapy: Secondary | ICD-10-CM | POA: Diagnosis not present

## 2021-07-13 DIAGNOSIS — Z87891 Personal history of nicotine dependence: Secondary | ICD-10-CM | POA: Diagnosis not present

## 2021-07-13 DIAGNOSIS — Z01818 Encounter for other preprocedural examination: Secondary | ICD-10-CM

## 2021-07-13 DIAGNOSIS — W109XXA Fall (on) (from) unspecified stairs and steps, initial encounter: Secondary | ICD-10-CM | POA: Insufficient documentation

## 2021-07-13 DIAGNOSIS — E785 Hyperlipidemia, unspecified: Secondary | ICD-10-CM | POA: Diagnosis not present

## 2021-07-13 HISTORY — DX: Unspecified asthma, uncomplicated: J45.909

## 2021-07-13 HISTORY — PX: SHOULDER ARTHROSCOPY WITH ROTATOR CUFF REPAIR AND OPEN BICEPS TENODESIS: SHX6677

## 2021-07-13 LAB — CBC
HCT: 41.4 % (ref 39.0–52.0)
Hemoglobin: 13.9 g/dL (ref 13.0–17.0)
MCH: 30.7 pg (ref 26.0–34.0)
MCHC: 33.6 g/dL (ref 30.0–36.0)
MCV: 91.4 fL (ref 80.0–100.0)
Platelets: 172 10*3/uL (ref 150–400)
RBC: 4.53 MIL/uL (ref 4.22–5.81)
RDW: 13.4 % (ref 11.5–15.5)
WBC: 9.2 10*3/uL (ref 4.0–10.5)
nRBC: 0 % (ref 0.0–0.2)

## 2021-07-13 SURGERY — SHOULDER ARTHROSCOPY WITH ROTATOR CUFF REPAIR AND OPEN BICEPS TENODESIS
Anesthesia: General | Site: Shoulder | Laterality: Right

## 2021-07-13 MED ORDER — MIDAZOLAM HCL 2 MG/2ML IJ SOLN: 1.0000 mg | Freq: Once | INTRAMUSCULAR | Status: AC

## 2021-07-13 MED ORDER — SUGAMMADEX SODIUM 200 MG/2ML IV SOLN
INTRAVENOUS | Status: DC | PRN
  Administered 2021-07-13: 200 mg via INTRAVENOUS

## 2021-07-13 MED ORDER — ONDANSETRON HCL 4 MG/2ML IJ SOLN
INTRAMUSCULAR | Status: AC
  Filled 2021-07-13: qty 2

## 2021-07-13 MED ORDER — FENTANYL CITRATE (PF) 100 MCG/2ML IJ SOLN
INTRAMUSCULAR | Status: DC | PRN
  Administered 2021-07-13: 100 ug via INTRAVENOUS

## 2021-07-13 MED ORDER — CHLORHEXIDINE GLUCONATE 0.12 % MT SOLN
OROMUCOSAL | Status: AC
  Filled 2021-07-13: qty 15

## 2021-07-13 MED ORDER — BUPIVACAINE LIPOSOME 1.3 % IJ SUSP
INTRAMUSCULAR | Status: DC | PRN
  Administered 2021-07-13: 10 mL via PERINEURAL

## 2021-07-13 MED ORDER — LIDOCAINE 2% (20 MG/ML) 5 ML SYRINGE
INTRAMUSCULAR | Status: AC
  Filled 2021-07-13: qty 5

## 2021-07-13 MED ORDER — DEXAMETHASONE SODIUM PHOSPHATE 10 MG/ML IJ SOLN
INTRAMUSCULAR | Status: AC
  Filled 2021-07-13: qty 1

## 2021-07-13 MED ORDER — BUPIVACAINE HCL (PF) 0.5 % IJ SOLN
INTRAMUSCULAR | Status: DC | PRN
  Administered 2021-07-13: 20 mL via PERINEURAL

## 2021-07-13 MED ORDER — FENTANYL CITRATE (PF) 250 MCG/5ML IJ SOLN
INTRAMUSCULAR | Status: AC
  Filled 2021-07-13: qty 5

## 2021-07-13 MED ORDER — ACETAMINOPHEN 500 MG PO TABS
1000.0000 mg | ORAL_TABLET | Freq: Once | ORAL | Status: AC
  Administered 2021-07-13: 1000 mg via ORAL
  Filled 2021-07-13: qty 2

## 2021-07-13 MED ORDER — PHENYLEPHRINE HCL-NACL 20-0.9 MG/250ML-% IV SOLN
INTRAVENOUS | Status: DC | PRN
  Administered 2021-07-13: 25 ug/min via INTRAVENOUS

## 2021-07-13 MED ORDER — PROPOFOL 10 MG/ML IV BOLUS
INTRAVENOUS | Status: AC
  Filled 2021-07-13: qty 20

## 2021-07-13 MED ORDER — ONDANSETRON HCL 4 MG/2ML IJ SOLN: 4.0000 mg | Freq: Once | INTRAMUSCULAR | Status: DC | PRN

## 2021-07-13 MED ORDER — FENTANYL CITRATE (PF) 100 MCG/2ML IJ SOLN
INTRAMUSCULAR | Status: AC
  Administered 2021-07-13: 50 ug via INTRAVENOUS
  Filled 2021-07-13: qty 2

## 2021-07-13 MED ORDER — EPHEDRINE SULFATE (PRESSORS) 50 MG/ML IJ SOLN
INTRAMUSCULAR | Status: DC | PRN
  Administered 2021-07-13 (×2): 10 mg via INTRAVENOUS

## 2021-07-13 MED ORDER — FENTANYL CITRATE (PF) 100 MCG/2ML IJ SOLN: 50.0000 ug | Freq: Once | INTRAMUSCULAR | Status: AC

## 2021-07-13 MED ORDER — DEXAMETHASONE SODIUM PHOSPHATE 10 MG/ML IJ SOLN
INTRAMUSCULAR | Status: DC | PRN
  Administered 2021-07-13: 10 mg via INTRAVENOUS

## 2021-07-13 MED ORDER — SODIUM CHLORIDE 0.9 % IR SOLN: Status: DC | PRN

## 2021-07-13 MED ORDER — OXYCODONE HCL 5 MG/5ML PO SOLN: 5.0000 mg | Freq: Once | ORAL | Status: DC | PRN

## 2021-07-13 MED ORDER — GABAPENTIN 300 MG PO CAPS
300.0000 mg | ORAL_CAPSULE | Freq: Once | ORAL | Status: AC
  Administered 2021-07-13: 300 mg via ORAL
  Filled 2021-07-13: qty 1

## 2021-07-13 MED ORDER — TRANEXAMIC ACID-NACL 1000-0.7 MG/100ML-% IV SOLN
1000.0000 mg | INTRAVENOUS | Status: AC
  Administered 2021-07-13: 1000 mg via INTRAVENOUS
  Filled 2021-07-13: qty 100

## 2021-07-13 MED ORDER — EPINEPHRINE PF 1 MG/ML IJ SOLN
INTRAMUSCULAR | Status: AC
  Filled 2021-07-13: qty 2

## 2021-07-13 MED ORDER — PROPOFOL 10 MG/ML IV BOLUS
INTRAVENOUS | Status: DC | PRN
  Administered 2021-07-13: 180 mg via INTRAVENOUS

## 2021-07-13 MED ORDER — GLYCOPYRROLATE 0.2 MG/ML IJ SOLN
INTRAMUSCULAR | Status: DC | PRN
  Administered 2021-07-13: .2 mg via INTRAVENOUS

## 2021-07-13 MED ORDER — OXYCODONE HCL 5 MG PO TABS: 5.0000 mg | ORAL_TABLET | Freq: Once | ORAL | Status: DC | PRN

## 2021-07-13 MED ORDER — LACTATED RINGERS IV SOLN: INTRAVENOUS | Status: DC

## 2021-07-13 MED ORDER — CEFAZOLIN SODIUM-DEXTROSE 2-4 GM/100ML-% IV SOLN
2.0000 g | INTRAVENOUS | Status: AC
  Administered 2021-07-13: 2 g via INTRAVENOUS
  Filled 2021-07-13: qty 100

## 2021-07-13 MED ORDER — LIDOCAINE 2% (20 MG/ML) 5 ML SYRINGE
INTRAMUSCULAR | Status: DC | PRN
  Administered 2021-07-13: 80 mg via INTRAVENOUS

## 2021-07-13 MED ORDER — ROCURONIUM BROMIDE 100 MG/10ML IV SOLN
INTRAVENOUS | Status: DC | PRN
  Administered 2021-07-13: 80 mg via INTRAVENOUS
  Administered 2021-07-13: 20 mg via INTRAVENOUS

## 2021-07-13 MED ORDER — MIDAZOLAM HCL 2 MG/2ML IJ SOLN
INTRAMUSCULAR | Status: AC
  Administered 2021-07-13: 1 mg via INTRAVENOUS
  Filled 2021-07-13: qty 2

## 2021-07-13 MED ORDER — SODIUM CHLORIDE 0.9 % IR SOLN
Status: DC | PRN
  Administered 2021-07-13: 6000 mL
  Administered 2021-07-13: 3000 mL
  Administered 2021-07-13: 6000 mL
  Administered 2021-07-13: 1

## 2021-07-13 MED ORDER — TRANEXAMIC ACID-NACL 1000-0.7 MG/100ML-% IV SOLN
INTRAVENOUS | Status: AC
  Filled 2021-07-13: qty 100

## 2021-07-13 MED ORDER — HYDROMORPHONE HCL 1 MG/ML IJ SOLN: 0.2500 mg | INTRAMUSCULAR | Status: DC | PRN

## 2021-07-13 MED ORDER — CHLORHEXIDINE GLUCONATE 0.12 % MT SOLN
15.0000 mL | Freq: Once | OROMUCOSAL | Status: AC
Start: 2021-07-13 — End: 2021-07-13
  Administered 2021-07-13: 15 mL via OROMUCOSAL
  Filled 2021-07-13: qty 15

## 2021-07-13 MED ORDER — ONDANSETRON HCL 4 MG/2ML IJ SOLN
INTRAMUSCULAR | Status: DC | PRN
  Administered 2021-07-13: 4 mg via INTRAVENOUS

## 2021-07-13 MED ORDER — SUCCINYLCHOLINE CHLORIDE 200 MG/10ML IV SOSY
PREFILLED_SYRINGE | INTRAVENOUS | Status: AC
  Filled 2021-07-13: qty 10

## 2021-07-13 SURGICAL SUPPLY — 83 items
AID PSTN UNV HD RSTRNT DISP (MISCELLANEOUS) ×1
ANCH SUT 2 SWLK 19.1 CLS EYLT (Anchor) ×2 IMPLANT
ANCH SUT 2.6 FBRTK 1.7 KNTLS (Anchor) ×1 IMPLANT
ANCH SUT FBRTK 1.3 2 TPE (Anchor) ×2 IMPLANT
ANCHOR FBRTK 2.6 SUTURETAP 1.3 (Anchor) ×2 IMPLANT
ANCHOR SWIVELOCK BIO 4.75X19.1 (Anchor) ×2 IMPLANT
APL PRP STRL LF DISP 70% ISPRP (MISCELLANEOUS) ×1
BAG COUNTER SPONGE SURGICOUNT (BAG) ×2 IMPLANT
BAG SPNG CNTER NS LX DISP (BAG) ×1
BLADE EXCALIBUR 4.0X13 (MISCELLANEOUS) IMPLANT
BLADE SURG 11 STRL SS (BLADE) ×2 IMPLANT
BUR OVAL 6.0 (BURR) IMPLANT
CHLORAPREP W/TINT 26 (MISCELLANEOUS) ×2 IMPLANT
COOLER ICEMAN CLASSIC (MISCELLANEOUS) ×1 IMPLANT
COVER SURGICAL LIGHT HANDLE (MISCELLANEOUS) ×2 IMPLANT
DRAPE INCISE IOBAN 66X45 STRL (DRAPES) ×2 IMPLANT
DRAPE SHOULDER BEACH CHAIR (DRAPES) ×1 IMPLANT
DRAPE STERI 35X30 U-POUCH (DRAPES) ×2 IMPLANT
DRAPE U-SHAPE 47X51 STRL (DRAPES) ×4 IMPLANT
DRSG PAD ABDOMINAL 8X10 ST (GAUZE/BANDAGES/DRESSINGS) ×3 IMPLANT
DRSG TEGADERM 4X4.75 (GAUZE/BANDAGES/DRESSINGS) ×4 IMPLANT
ELECT REM PT RETURN 9FT ADLT (ELECTROSURGICAL)
ELECTRODE REM PT RTRN 9FT ADLT (ELECTROSURGICAL) ×1 IMPLANT
FILTER STRAW FLUID ASPIR (MISCELLANEOUS) ×1 IMPLANT
GAUZE PAD ABD 8X10 STRL (GAUZE/BANDAGES/DRESSINGS) ×1 IMPLANT
GAUZE SPONGE 4X4 12PLY STRL LF (GAUZE/BANDAGES/DRESSINGS) ×2 IMPLANT
GAUZE XEROFORM 1X8 LF (GAUZE/BANDAGES/DRESSINGS) ×1 IMPLANT
GLOVE BIOGEL PI IND STRL 8 (GLOVE) ×1 IMPLANT
GLOVE BIOGEL PI INDICATOR 8 (GLOVE) ×1
GLOVE ECLIPSE 8.0 STRL XLNG CF (GLOVE) ×2 IMPLANT
GLOVE SURG ENC MOIS LTX SZ6 (GLOVE) ×6 IMPLANT
GLOVE SURG LTX SZ8 (GLOVE) ×2 IMPLANT
GLOVE SURG SYN 7.5  E (GLOVE) ×6
GLOVE SURG SYN 7.5 E (GLOVE) ×3 IMPLANT
GLOVE SURG SYN 7.5 PF PI (GLOVE) ×3 IMPLANT
GLOVE SURG UNDER POLY LF SZ6.5 (GLOVE) ×4 IMPLANT
GOWN STRL REUS W/ TWL LRG LVL3 (GOWN DISPOSABLE) ×3 IMPLANT
GOWN STRL REUS W/TWL LRG LVL3 (GOWN DISPOSABLE) ×6
IMPL FIBERTAK KNTLS 2.6 (Anchor) IMPLANT
IMPLANT FIBERTAK KNTLS 2.6 (Anchor) ×2 IMPLANT
KIT BASIN OR (CUSTOM PROCEDURE TRAY) ×2 IMPLANT
KIT TURNOVER KIT B (KITS) ×2 IMPLANT
MANIFOLD NEPTUNE II (INSTRUMENTS) ×2 IMPLANT
NDL HYPO 25X1 1.5 SAFETY (NEEDLE) ×1 IMPLANT
NDL SCORPION MULTI FIRE (NEEDLE) IMPLANT
NDL SPNL 18GX3.5 QUINCKE PK (NEEDLE) ×1 IMPLANT
NDL SUT 6 .5 CRC .975X.05 MAYO (NEEDLE) IMPLANT
NEEDLE HYPO 25X1 1.5 SAFETY (NEEDLE) ×2 IMPLANT
NEEDLE MAYO TAPER (NEEDLE)
NEEDLE SCORPION MULTI FIRE (NEEDLE) ×2 IMPLANT
NEEDLE SPNL 18GX3.5 QUINCKE PK (NEEDLE) ×2 IMPLANT
NS IRRIG 1000ML POUR BTL (IV SOLUTION) ×2 IMPLANT
PACK SHOULDER (CUSTOM PROCEDURE TRAY) ×2 IMPLANT
PAD ARMBOARD 7.5X6 YLW CONV (MISCELLANEOUS) ×3 IMPLANT
PAD COLD SHLDR WRAP-ON (PAD) ×1 IMPLANT
PORT APPOLLO RF 90DEGREE MULTI (SURGICAL WAND) ×1 IMPLANT
RESTRAINT HEAD UNIVERSAL NS (MISCELLANEOUS) ×2 IMPLANT
SLING ARM IMMOBILIZER MED (SOFTGOODS) IMPLANT
SPONGE T-LAP 4X18 ~~LOC~~+RFID (SPONGE) ×3 IMPLANT
STRIP CLOSURE SKIN 1/2X4 (GAUZE/BANDAGES/DRESSINGS) ×2 IMPLANT
SUCTION FRAZIER HANDLE 10FR (MISCELLANEOUS)
SUCTION TUBE FRAZIER 10FR DISP (MISCELLANEOUS) ×1 IMPLANT
SUT ETHILON 3 0 FSL (SUTURE) ×1 IMPLANT
SUT ETHILON 3 0 PS 1 (SUTURE) ×1 IMPLANT
SUT FIBERWIRE #2 38 T-5 BLUE (SUTURE)
SUT MNCRL AB 3-0 PS2 18 (SUTURE) ×1 IMPLANT
SUT VIC AB 0 CT1 27 (SUTURE) ×2
SUT VIC AB 0 CT1 27XBRD ANBCTR (SUTURE) ×1 IMPLANT
SUT VIC AB 1 CT1 27 (SUTURE) ×2
SUT VIC AB 1 CT1 27XBRD ANBCTR (SUTURE) ×1 IMPLANT
SUT VIC AB 2-0 CT1 27 (SUTURE) ×2
SUT VIC AB 2-0 CT1 TAPERPNT 27 (SUTURE) ×1 IMPLANT
SUT VICRYL 0 UR6 27IN ABS (SUTURE) ×1 IMPLANT
SUTURE FIBERWR #2 38 T-5 BLUE (SUTURE) IMPLANT
SUTURE TAPE TIGERLINK 1.3MM BL (SUTURE) IMPLANT
SUTURETAPE TIGERLINK 1.3MM BL (SUTURE) ×6
SYR 20ML LL LF (SYRINGE) ×2 IMPLANT
SYR 3ML LL SCALE MARK (SYRINGE) ×2 IMPLANT
SYR TB 1ML LUER SLIP (SYRINGE) ×1 IMPLANT
TOWEL GREEN STERILE (TOWEL DISPOSABLE) ×2 IMPLANT
TOWEL GREEN STERILE FF (TOWEL DISPOSABLE) ×2 IMPLANT
TUBING ARTHROSCOPY IRRIG 16FT (MISCELLANEOUS) ×2 IMPLANT
WATER STERILE IRR 1000ML POUR (IV SOLUTION) ×2 IMPLANT

## 2021-07-13 NOTE — Addendum Note (Signed)
Addendum  created 07/13/21 2127 by Josephine Igo, MD   Child order released for a procedure order, Clinical Note Signed, Intraprocedure Blocks edited, Intraprocedure Meds edited, SmartForm saved

## 2021-07-13 NOTE — Anesthesia Postprocedure Evaluation (Signed)
Anesthesia Post Note  Patient: Chad Manning  Procedure(s) Performed: RIGHT SHOULDER ARTHROSCOPY WITH ROTATOR CUFF REPAIR AND BICEPS TENODESIS (Right: Shoulder)     Patient location during evaluation: PACU Anesthesia Type: General Level of consciousness: awake and alert Pain management: pain level controlled Vital Signs Assessment: post-procedure vital signs reviewed and stable Respiratory status: spontaneous breathing, nonlabored ventilation and respiratory function stable Cardiovascular status: stable and blood pressure returned to baseline Anesthetic complications: no   No notable events documented.  Last Vitals:  Vitals:   07/13/21 1845 07/13/21 1900  BP: 130/67 133/67  Pulse: 83 79  Resp: 14 13  Temp:  36.7 C  SpO2: 93% 93%    Last Pain:  Vitals:   07/13/21 1900  TempSrc:   PainSc: 0-No pain                 Audry Pili

## 2021-07-13 NOTE — Anesthesia Preprocedure Evaluation (Signed)
Anesthesia Evaluation  Patient identified by MRN, date of birth, ID band Patient awake    Reviewed: Allergy & Precautions, NPO status , Patient's Chart, lab work & pertinent test results  Airway Mallampati: II  TM Distance: >3 FB Neck ROM: Full    Dental no notable dental hx. (+) Teeth Intact, Caps, Dental Advisory Given   Pulmonary asthma , former smoker,    Pulmonary exam normal breath sounds clear to auscultation       Cardiovascular negative cardio ROS Normal cardiovascular exam Rhythm:Regular Rate:Normal     Neuro/Psych negative neurological ROS  negative psych ROS   GI/Hepatic negative GI ROS, Neg liver ROS,   Endo/Other  Hyperlipidemia- on no Rx  Renal/GU negative Renal ROS  negative genitourinary   Musculoskeletal  (+) Arthritis , Osteoarthritis,  Right shoulder rotator cuff tear   Abdominal   Peds  Hematology   Anesthesia Other Findings   Reproductive/Obstetrics                             Anesthesia Physical Anesthesia Plan  ASA: 2  Anesthesia Plan: General   Post-op Pain Management: Regional block* and Minimal or no pain anticipated   Induction: Intravenous  PONV Risk Score and Plan: 3 and Treatment may vary due to age or medical condition, Ondansetron and Dexamethasone  Airway Management Planned: Oral ETT  Additional Equipment: None  Intra-op Plan:   Post-operative Plan: Extubation in OR  Informed Consent: I have reviewed the patients History and Physical, chart, labs and discussed the procedure including the risks, benefits and alternatives for the proposed anesthesia with the patient or authorized representative who has indicated his/her understanding and acceptance.     Dental advisory given  Plan Discussed with: Anesthesiologist and CRNA  Anesthesia Plan Comments:         Anesthesia Quick Evaluation

## 2021-07-13 NOTE — Brief Op Note (Signed)
   Brief Op Note  Date of Surgery: 07/13/2021  Preoperative Diagnosis: RIGHT ROTATOR CUFF TEAR  Postoperative Diagnosis: same  Procedure: Procedure(s): RIGHT SHOULDER ARTHROSCOPY WITH ROTATOR CUFF REPAIR AND BICEPS TENODESIS  Implants: Implant Name Type Inv. Item Serial No. Manufacturer Lot No. LRB No. Used Action  ANCHOR SWIVELOCK BIO 4.75X19.1 - [id number] Anchor ANCHOR SWIVELOCK BIO 4.75X19.1  [employer] [id number] Right 1 Implanted  ANCHOR FBRTK 2.6 SUTURETAP 1.3 - [id number] Anchor ANCHOR FBRTK 2.6 SUTURETAP 1.3  [employer] [id number] Right 2 Implanted  IMPLANT FIBERTAK KNTLS 2.6 - [id number] Anchor IMPLANT FIBERTAK KNTLS 2.6  [employer] [id number] Right 1 Implanted  [facility] BIO 4.75X19.1 - [id number] Anchor ANCHOR SWIVELOCK BIO 4.75X19.1 [id number] Rolinda Roan [id number] Right 1 Implanted    Surgeons: Surgeon(s): Vanetta Mulders, MD  Anesthesia: Regional    Estimated Blood Loss: See anesthesia record  Complications: None  Condition to PACU: Stable  Yevonne Pax, MD 07/13/2021 6:15 PM

## 2021-07-13 NOTE — Op Note (Signed)
Date of Surgery: 07/13/2021  INDICATIONS: Mr. Monteforte is a 66 y.o.-year-old male with right shoulder acute rotator cuff repair.  After risks and benefits of surgery were explained with him he elected for operative repair..  The risk and benefits of the procedure were discussed in detail and documented in the pre-operative evaluation.   PREOPERATIVE DIAGNOSES: Right shoulder, acute atraumatic rotator cuff tear, biceps tendinitis, and subacromial impingement.  POSTOPERATIVE DIAGNOSIS: Same.  PROCEDURE: Arthroscopic subacromial decompression - 36629 Arthroscopic rotator cuff repair - 47654 Arthroscopic biceps tenodesis - 65035  SURGEON: Yevonne Pax MD  ASSISTANT: Raynelle Fanning, ATC  ANESTHESIA:  general plus interscalene nerve block  IV FLUIDS AND URINE: See anesthesia record.  ANTIBIOTICS: Ancef 2 g  ESTIMATED BLOOD LOSS: 25 mL.  IMPLANTS:  Implant Name Type Inv. Item Serial No. Manufacturer Lot No. LRB No. Used Action  ANCHOR SWIVELOCK BIO 4.75X19.1 - [id number] Anchor ANCHOR SWIVELOCK BIO 4.75X19.1  [employer] [id number] Right 1 Implanted  ANCHOR FBRTK 2.6 SUTURETAP 1.3 - [id number] Anchor ANCHOR FBRTK 2.6 SUTURETAP 1.3  [employer] [id number] Right 2 Implanted  IMPLANT FIBERTAK KNTLS 2.6 - [id number] Anchor IMPLANT FIBERTAK KNTLS 2.6  [employer] [id number] Right 1 Implanted  [facility] SWIVELOCK BIO 4.75X19.1 - [id number] Anchor ANCHOR SWIVELOCK BIO 4.75X19.1 [id number] [employer] [id number] Right 1 Implanted    DRAINS: None  CULTURES: None  COMPLICATIONS: none  PROCEDURE:    OPERATIVE FINDING: Exam under anesthesia:   Examination under anesthesia revealed forward elevation of 150 degrees.  With the arm at the side, there was 65 degrees of external rotation.  There is a 1+ anterior load shift and a 1+ posterior load shift.    Arthroscopic findings demonstrated: Articular space: Preserved Chondral surfaces: Normal Biceps: Subluxated medially over the comma tissue with  fraying Subscapularis: Full thickness tearing with retraction Supraspinatus: Full-thickness tearing Infraspinatus: Full-thickness tearing involving anterior margin    I identified the patient in the pre-operative holding area.  I marked the operative right shoulder with my initials. I reviewed the risks and benefits of the proposed surgical intervention and the patient wished to proceed.  Anesthesia was then performed with regional block.  The patient was transferred to the operative suite and placed in the beach chair position with all bony prominences padded.     SCDs were placed on bilateral lower extremity. Appropriate antibiotics was administered within 1 hour before incision.  Anesthesia was induced.  The operative extremity was then prepped and draped in standard fashion. A time out was performed confirming the correct extremity, correct patient and correct procedure.   The arthroscope was introduced in the glenohumeral joint from a posterior portal.  An anterior portal was created.  The shoulder was examined and the above findings were noted.     With an arthroscopic shaver and a wand ablator, synovitis throughout the  shoulder was resected.  The arthroscopic shaver was used to excise torn portions of the labrum back to a stable margin. Specifically this was done for the anterior superior and posterior labrum.  At this time the biceps tendon was tagged using a #2 nonabsorbable FiberWire suture in a repeated locking fashion using the scorpion.  Using 2 #2 luggage tag sutures, the subscapularis tendon was identified and similarly tagged.  The biceps was released from its anchor at the superior labrum.  This was done using electrocautery.  All 3 sutures were then brought into a 4.75 mm swivel lock into the lesser tuberosity to reapproximate the subscapularis into its native  approximation.    A shaver was introduced into the anterior portal and used to debride the undersurface of the acromion and  remove impinging osteophyte.  This was done in the subacromial space.  Through visualization from intra-articular, the footprint of the rotator cuff was debrided of soft tissues back to bleeding bone.  This was done with an arthroscopic shaver.     The rotator cuff was then approached through the subacromial space. Anterior, lateral, and posterior portals were used.  Bursectomy was performed with an arthroscopic shaver and ArthroCare wand.  The soft tissues on the undersurface of the acromion and clavicle were resected with the arthroscopic shaver and wand. There was good excursion that was noted of the tendon back to its footprint which would be amendable to an repair.   The rotator cuff was repaired with a double row configuration with two medial row all suture suture anchors as noted above with sutures passed from anterior to posterior in horizontal fashion with a self passing suture device.  The posterior 2 sutures were tied with knots in order to ultimately reapproximate the tendon to its anatomic footprint. A total of 6 limbs of suture were passed.  The sutures were placed with a single lateral row anchor.  This provided excellent anatomic footprint approximation.   The shoulder was irrigated.  The arthroscopic instruments were removed.  Wounds were closed with 3-0 nylon sutures.  A sterile dressing was applied with xeroform, 4x8s, abdominal pad, and tape. An Gar Gibbon was placed and the upper extremity was placed in a shoulder immobilizer.  The patient tolerated the procedure well and was taken to the recovery room in stable condition.  All counts were correct in the case.  houlder immobilizer.  The patient tolerated the procedure well and was taken to the recovery room in stable condition.    POSTOPERATIVE PLAN: He will be nonweightbearing on the right arm in a sling.  He will begin physical therapy immediately postop.  I will see him back in 2 weeks for suture removal.  Yevonne Pax, MD 6:17  PM

## 2021-07-13 NOTE — Interval H&P Note (Signed)
History and Physical Interval Note:  07/13/2021 3:21 PM  Chad Manning  has presented today for surgery, with the diagnosis of [place].  The various methods of treatment have been discussed with the patient and family. After consideration of risks, benefits and other options for treatment, the patient has consented to  Procedure(s): RIGHT SHOULDER ARTHROSCOPY WITH ROTATOR CUFF REPAIR AND OPEN BICEPS TENODESIS (Right) as a surgical intervention.  The patient's history has been reviewed, patient examined, no change in status, stable for surgery.  I have reviewed the patient's chart and labs.  Questions were answered to the patient's satisfaction.     Vanetta Mulders

## 2021-07-13 NOTE — Anesthesia Procedure Notes (Signed)
  Anesthesia Regional Block: Interscalene brachial plexus block   Pre-Anesthetic Checklist: , timeout performed,  Correct Patient, Correct Site, Correct Laterality,  Correct Procedure, Correct Position, site marked,  Risks and benefits discussed,  Surgical consent,  Pre-op evaluation,  At surgeon's request and post-op pain management  Laterality: Right  Prep: chloraprep       Needles:   Needle Type: Echogenic Stimulator Needle     Needle Length: 10cm  Needle Gauge: 21   Needle insertion depth: 7 cm   Additional Needles:   Procedures:,,,, ultrasound used (permanent image in chart),,   Motor weakness within 10 minutes.  Narrative:  Start time: 07/13/2021 3:30 PM End time: 07/13/2021 3:35 PM Injection made incrementally with aspirations every 5 mL.  Performed by: Personally  Anesthesiologist: Josephine Igo, MD  Additional Notes: Timeout performed. Patient sedated. Relevant anatomy ID'd using [place]. Incremental 2-60m injection of LA with frequent aspiration. Patient tolerated procedure well.

## 2021-07-13 NOTE — Discharge Instructions (Signed)
     Discharge Instructions    Attending Surgeon: Vanetta Mulders, MD Office Phone Number: [phone]   Diagnosis and Procedures:    Surgeries Performed: Right shoulder rotator cuff repair and biceps tenodesis  Discharge Plan:    Diet: Resume usual diet. Begin with light or bland foods.  Drink plenty of fluids.  Activity:  Keep sling and dressing in place until your follow up visit in Physical Therapy You are advised to go home directly from the hospital or surgical center. Restrict your activities.  GENERAL INSTRUCTIONS: 1.  Keep your surgical site elevated above your heart for at least 5-7 days or longer to prevent swelling. This will improve your comfort and your overall recovery following surgery.     2. Please call Dr. Eddie Dibbles office at [phone] with questions Monday-Friday during business hours. If no one answers, please leave a message and someone should get back to the patient within 24 hours. For emergencies please call 911 or proceed to the emergency room.   3. Patient to notify surgical team if experiences any of the following: Bowel/Bladder dysfunction, uncontrolled pain, nerve/muscle weakness, incision with increased drainage or redness, nausea/vomiting and Fever greater than 101.0 F.  Be alert for signs of infection including redness, streaking, odor, fever or chills. Be alert for excessive pain or bleeding and notify your surgeon immediately.  WOUND INSTRUCTIONS:   Leave your dressing/cast/splint in place until your post operative visit.  Keep it clean and dry.  Always keep the incision clean and dry until the staples/sutures are removed. If there is no drainage from the incision you should keep it open to air. If there is drainage from the incision you must keep it covered at all times until the drainage stops  Do not soak in a bath tub, hot tub, pool, lake or other body of water until 21 days after your surgery and your incision is completely dry and  healed.  If you have removable sutures (or staples) they must be removed 10-14 days (unless otherwise instructed) from the day of your surgery.     1)  Elevate the extremity as much as possible.  2)  Keep the dressing clean and dry.  3)  Please call us if the dressing becomes wet or dirty.  4)  If you are experiencing worsening pain or worsening swelling, please call.     MEDICATIONS: Resume all previous home medications at the previous prescribed dose and frequency unless otherwise noted Start taking the  pain medications on an as-needed basis as prescribed  Please taper down pain medication over the next week following surgery.  Ideally you should not require a refill of any narcotic pain medication.  Take pain medication with food to minimize nausea. In addition to the prescribed pain medication, you may take over-the-counter pain relievers such as Tylenol.  Do NOT take additional tylenol if your pain medication already has tylenol in it.  Aspirin '325mg'$  daily for four weeks.      FOLLOWUP INSTRUCTIONS: 1. Follow up at the Physical Therapy Clinic 3-4 days following surgery. This appointment should be scheduled unless other arrangements have been made.The Physical Therapy scheduling number is [phone] if an appointment has not already been arranged.  2. Contact Dr. Eddie Dibbles office during office hours at [phone] or the practice after hours line at [phone] for non-emergencies. For medical emergencies call 911.   Discharge Location: Home

## 2021-07-13 NOTE — Anesthesia Procedure Notes (Signed)
Procedure Name: Intubation Date/Time: 07/13/2021 4:14 PM  Performed by: Ezequiel Kayser, CRNAPre-anesthesia Checklist: Patient identified, Emergency Drugs available, Suction available and Patient being monitored Patient Re-evaluated:Patient Re-evaluated prior to induction Oxygen Delivery Method: Circle System Utilized Preoxygenation: Pre-oxygenation with 100% oxygen Induction Type: IV induction Ventilation: Mask ventilation without difficulty Laryngoscope Size: Mac and 4 Grade View: Grade I Tube type: Oral Tube size: 7.0 mm Number of attempts: 1 Airway Equipment and Method: Stylet and Oral airway Placement Confirmation: ETT inserted through vocal cords under direct vision, positive ETCO2 and breath sounds checked- equal and bilateral Secured at: 23 cm Tube secured with: Tape Dental Injury: Teeth and Oropharynx as per pre-operative assessment

## 2021-07-13 NOTE — Transfer of Care (Signed)
Immediate Anesthesia Transfer of Care Note  Patient: Chad Manning  Procedure(s) Performed: RIGHT SHOULDER ARTHROSCOPY WITH ROTATOR CUFF REPAIR AND BICEPS TENODESIS (Right: Shoulder)  Patient Location: PACU  Anesthesia Type:General  Level of Consciousness: awake, alert , oriented and patient cooperative  Airway & Oxygen Therapy: Patient Spontanous Breathing  Post-op Assessment: Report given to RN, Post -op Vital signs reviewed and stable and Patient moving all extremities X 4  Post vital signs: Reviewed and stable  Last Vitals:  Vitals Value Taken Time  BP 121/94 07/13/21 1830  Temp    Pulse 82 07/13/21 1837  Resp 13 07/13/21 1837  SpO2 92 % 07/13/21 1837  Vitals shown include unvalidated device data.  Last Pain:  Vitals:   07/13/21 1336  TempSrc:   PainSc: 1       Patients Stated Pain Goal: 0 (17/61/60 7371)  Complications: No notable events documented.

## 2021-07-15 ENCOUNTER — Ambulatory Visit (HOSPITAL_BASED_OUTPATIENT_CLINIC_OR_DEPARTMENT_OTHER): Payer: Medicare PPO | Admitting: Physical Therapy

## 2021-07-15 ENCOUNTER — Encounter (HOSPITAL_COMMUNITY): Payer: Self-pay | Admitting: Orthopaedic Surgery

## 2021-07-15 NOTE — Therapy (Signed)
OUTPATIENT PHYSICAL THERAPY SHOULDER EVALUATION   Patient Name: Chad Manning MRN: [id number] DOB:1955-06-13, 66 y.o., male Today's Date: 07/17/2021   Visit Number:                                  1 Number of visits:                             28 Date for PT Re-evaluation:             10/09/2021 Authorization Type:                         [employer] PT Start Time                                 1028     PT End Time                                  1117 PT Time Calcuation (min)              49 min Activity Tolerance:                          Patient tolerated treatment well Behavior During Therapy:              WFL for tasks assessed/performed   Past Medical History:  Diagnosis Date   Arthritis    Asthma    x 1 age 36   Hyperlipidemia    Right rotator cuff tear    Past Surgical History:  Procedure Laterality Date   COLONOSCOPY     JOINT REPLACEMENT     SHOULDER ARTHROSCOPY WITH ROTATOR CUFF REPAIR AND OPEN BICEPS TENODESIS Right 07/13/2021   Procedure: RIGHT SHOULDER ARTHROSCOPY WITH ROTATOR CUFF REPAIR AND BICEPS TENODESIS;  Surgeon: Vanetta Mulders, MD;  Location: [place];  Service: Orthopedics;  Laterality: Right;   TOTAL HIP ARTHROPLASTY     bilatera   Patient Active Problem List   Diagnosis Date Noted   Traumatic complete tear of right rotator cuff    Former smoker 12/14/2019   Acquired hallux rigidus of right foot 11/29/2018   Dyslipidemia 03/15/2018   Hyperglycemia 03/15/2018     REFERRING PROVIDER: Vanetta Mulders, MD   REFERRING DIAG: S46.011A (ICD-10-CM) - Traumatic complete tear of right rotator cuff, initial encounter   THERAPY DIAG:  Right shoulder pain, unspecified chronicity  Stiffness of right shoulder, not elsewhere classified  Muscle weakness (generalized)  Rationale for Evaluation and Treatment Rehabilitation  ONSET DATE: Injury 06/05/2021 ; DOS 07/13/2021  SUBJECTIVE:  SUBJECTIVE STATEMENT: Pt fell down steps on May 26 while he was at the beach.  Pt had a MRI which showed a full-thickness rotator cuff tear.  MRI findings in MD note indicated full-thickness tear involving the supra and infraspinatus as well as a full-thickness tear of the subscapularis muscle. Also there is biceps subluxation.   Pt underwent R shoulder arthroscopic rotator cuff repair, biceps tenodesis, and subacromial decompression on 07/13/2021.   PT sent message asking if MD repaired supraspinatus, infraspinatus, and subscapularis.  MD replied stating that was correct and to please follow RCR protocol with added subscapularis restrictions.  Post Op Plan (per Op note): He will be nonweightbearing on the right arm in a sling.  He will begin physical therapy immediately postop.  MD to see pt back in 2 weeks for suture removal  Pt is sling dependent and is limited with all self care activities and ADLs/IADLs.  Pt is unable to perform any reaching and overhead activities with R UE.  He enjoys doing work around his home and work on the car though unable to currently.  Pt unable to perform yard work.  Unable to pick up granddaugther   PERTINENT HISTORY: R shoulder arthroscopic rotator cuff repair of supraspinatus, infraspinatus, and subscapularis, biceps tenodesis, and subacromial decompression on 07/13/2021.  MD message indicated subscapularis restrictions also.  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 None at rest NPRS:  Current and best:  0/10, Worst: 2-3/10 Location:  R shoulder  PRECAUTIONS: Other: RCR of supraspinatus, infraspinatus, and subscapularis.  Hx of bilat THA  WEIGHT BEARING RESTRICTIONS Yes NWB R UE    FALLS:  Has patient fallen in last 6 months? Yes. Number of falls 1, this injury  LIVING ENVIRONMENT: Lives with: lives with their spouse Lives  in: 1 story home Stairs: 2 steps without rail to enter home   OCCUPATION: Pt is retired  PLOF: Independent; Pt was able to perform all of his ADLs/IADLs and reaching and overhead activities independently without limitation.  Pt was able to perform yard work and work on car without limitation.  Pt was able to pick up granddaughter.   PATIENT GOALS return to PLOF, have full ROM and strength, to be able to perform car work and yard work, pick up granddaughter  OBJECTIVE:   DIAGNOSTIC FINDINGS:  Pt had x rays and a MRI prior to surgery.   PATIENT SURVEYS:  UEFI:  0/80  COGNITION:  Overall cognitive status: Within functional limits for tasks assessed     OBSERVATION: Pt in Donjoy sling with abduction pillow.  Pt has post-operative dressing.  PT removed post-operative dressing.  Pt has 3 portals located anteriorly and 1 posteriorly which are all intact with stitches.  Pt has some expected dried blood though nothing excessive.  Pt has no signs of infection.  PT applied new gauze over portals and new tegaderm over gauze.    Pt is R hand dominant.    UPPER EXTREMITY ROM:   Active ROM Right eval Left eval  Shoulder flexion  169  Shoulder scaption  170  Shoulder abduction  155  Shoulder adduction    Shoulder internal rotation    Shoulder external rotation    Elbow flexion    Elbow extension    Wrist flexion    Wrist extension    Wrist ulnar deviation    Wrist radial deviation    Wrist pronation    Wrist supination    (  Blank rows = not tested)  R shoulder ROM not tested  PALPATION:  TTP:  anterior shoulder   TODAY'S TREATMENT:  Pt performed wrist flex/ext AROM x 20 reps in sling  and towel gripping 2x10 reps in sling.  Pt received a HEP handout and was educated in correct form and appropriate frequency.  Instructed pt to perform HEP in sling.   See below for pt education.    PATIENT EDUCATION: Education details: PT answered questions including concerning brace and  positioning.  Educated pt and wife with correct brace positioning.  Educated pt concerning post-op restrictions and protocol.  Instructed pt to be compliant with sling usage and not using R UE.  Educated pt concerning dx, relevant anatomy, HEP, and POC.   Person educated: Patient and Spouse Education method: Explanation, Demonstration, Tactile cues, Verbal cues, and Handouts Education comprehension: verbalized understanding, returned demonstration, verbal cues required, tactile cues required, and needs further education   HOME EXERCISE PROGRAM: Access Code: [email or web address] URL: [email or web address] Date: 07/17/2021 Prepared by: Ronny Flurry  Exercises - Wrist AROM Flexion Extension  - 3 x daily - 7 x weekly - 2-3 sets - 10 reps - Seated Gripping Towel  - 2-3 x daily - 7 x weekly - 2 sets - 10 reps  ASSESSMENT:  CLINICAL IMPRESSION: Patient is a 66 y.o. male 3 days s/p R shoulder arthroscopic rotator cuff repair of supraspinatus, infraspinatus, and subscapularis, biceps tenodesis, and subacromial decompression presenting to the clinic with R shoulder pain, limited ROM in R shoulder, and muscle weakness in R UE.  Pt is sling dependent and is limited with all self care activities and ADLs/IADLs.  Pt is unable to perform any reaching and overhead activities with R UE.  He is unable to work on his car and perform work around his home.  Pt is unable to perform yard work.  Pt should benefit from skilled PT services per protocol to address impairments and to restore desired level of function.       OBJECTIVE IMPAIRMENTS decreased activity tolerance, decreased endurance, decreased ROM, decreased strength, hypomobility, impaired flexibility, and pain.   ACTIVITY LIMITATIONS carrying, lifting, bathing, dressing, reach over head, hygiene/grooming, and caring for others  PARTICIPATION LIMITATIONS: meal prep, cleaning, community activity, and yard work   [employer] POTENTIAL: Good  CLINICAL  DECISION MAKING: Stable/uncomplicated  EVALUATION COMPLEXITY: Low   GOALS:  SHORT TERM GOALS:   Pt will be independent and compliant with HEP for improved ROM, pain, and function.  Baseline: Goal status: INITIAL Target date: 08/27/2021   2.  Pt will tolerate R shoulder PROM per protocol without significant pain for improved mobility and stiffness Baseline:  Goal status: INITIAL Target date: 08/06/2021   3.  Pt will demo R shoulder PROM to be 140 deg in flexion and 30 deg in ER for improved mobility and stiffness.  Baseline:  Goal status: INITIAL Target date: 08/27/2021   4.  Pt will wean out of sling per MD allowance without adverse effects.  Baseline:  Goal status: INITIAL Target date: 08/27/2021    5.  Pt will tolerate the initiation of shoulder AAROM without adverse effects.  Baseline:  Goal status: INITIAL Target date: 08/27/2021   6.  Pt will demo supine shoulder AAROM to be 120 deg in flexion and in 40 deg in ER for progression of AAROM and protocol Baseline:  Goal status: INITIAL Target date:  09/10/2021  7.  Pt will demo R shoulder flexion and scaption AROM to at least 100  deg in standing without significant shoulder hike for improved UE elevation. Baseline:  Goal status: INITIAL Target date:  10/01/2021  8.  Pt will be able to perform his self care activities with no > than minimal difficulty.   Baseline:  Goal status: INITIAL Target date:  10/08/2021   LONG TERM GOALS: Target date: 11/05/2021   (Remove Blue Hyperlink)  Pt will demo R shoulder AROM to be [facility] t/o for performance of ADLs and IADLs.  Baseline:  Goal status: INITIAL  2.  Pt will be able to perform his ADLs and IADLs without significant difficulty and pain. Baseline:  Goal status: INITIAL  3.   Pt will be able to perform his normal reaching and overhead activities without significant pain and limitation. Baseline:  Goal status: INITIAL  4.  Pt will demo at least 4/5 MMT strength t/o R  shoulder for improved tolerance with and performance of daily activities, functional lifting, and performance of yard work.  Baseline:  Goal status: INITIAL  5.  Pt will be able to perform appropriate functional carrying and lifting without significant pain in order to perform IADLs and household chores. Baseline:  Goal status: INITIAL  6.  Pt will be independent with advanced HEP for improved shoulder strength and stability to assist with returning to desired level of function and performing car work and yard work. Baseline:  Goal status: INITIAL   PLAN: PT FREQUENCY:  1x/wk x 4 weeks and 2x/wk x 12 weeks  PT DURATION: other: 16 weeks  PLANNED INTERVENTIONS: Therapeutic exercises, Therapeutic activity, Neuromuscular re-education, Patient/Family education, Joint mobilization, Aquatic Therapy, Dry Needling, Electrical stimulation, Spinal mobilization, Cryotherapy, Moist heat, Taping, Ultrasound, Manual therapy, and Re-evaluation  PLAN FOR NEXT SESSION: Cont per Dr. Eddie Dibbles Rotator Cuff Repair protocol with subscapularis restrictions.  Will use subscapularis repair protocol also.  Also has biceps tenodesis restrictions.  Progress slowly, Pt had supraspinatus, infraspinatus, and subscapularis repairs.     Selinda Michaels III PT, DPT 07/17/21 7:42 PM

## 2021-07-15 NOTE — Addendum Note (Signed)
Addendum  created 07/15/21 1228 by Josephine Igo, MD   Clinical Note Signed, Intraprocedure Blocks edited

## 2021-07-16 ENCOUNTER — Encounter (HOSPITAL_BASED_OUTPATIENT_CLINIC_OR_DEPARTMENT_OTHER): Payer: Self-pay | Admitting: Physical Therapy

## 2021-07-16 ENCOUNTER — Ambulatory Visit (HOSPITAL_BASED_OUTPATIENT_CLINIC_OR_DEPARTMENT_OTHER): Payer: Medicare PPO | Attending: Orthopaedic Surgery | Admitting: Physical Therapy

## 2021-07-16 ENCOUNTER — Other Ambulatory Visit (HOSPITAL_BASED_OUTPATIENT_CLINIC_OR_DEPARTMENT_OTHER): Payer: Self-pay

## 2021-07-16 DIAGNOSIS — X58XXXA Exposure to other specified factors, initial encounter: Secondary | ICD-10-CM | POA: Insufficient documentation

## 2021-07-16 DIAGNOSIS — S46011A Strain of muscle(s) and tendon(s) of the rotator cuff of right shoulder, initial encounter: Secondary | ICD-10-CM | POA: Diagnosis not present

## 2021-07-16 DIAGNOSIS — M6281 Muscle weakness (generalized): Secondary | ICD-10-CM | POA: Insufficient documentation

## 2021-07-16 DIAGNOSIS — M25611 Stiffness of right shoulder, not elsewhere classified: Secondary | ICD-10-CM | POA: Diagnosis not present

## 2021-07-16 DIAGNOSIS — M25511 Pain in right shoulder: Secondary | ICD-10-CM | POA: Insufficient documentation

## 2021-07-18 ENCOUNTER — Encounter: Payer: Self-pay | Admitting: Family Medicine

## 2021-07-21 ENCOUNTER — Other Ambulatory Visit (HOSPITAL_COMMUNITY): Payer: Self-pay

## 2021-07-22 ENCOUNTER — Ambulatory Visit (HOSPITAL_BASED_OUTPATIENT_CLINIC_OR_DEPARTMENT_OTHER): Payer: Medicare PPO | Admitting: Physical Therapy

## 2021-07-22 ENCOUNTER — Encounter (HOSPITAL_BASED_OUTPATIENT_CLINIC_OR_DEPARTMENT_OTHER): Payer: Self-pay | Admitting: Physical Therapy

## 2021-07-22 DIAGNOSIS — M6281 Muscle weakness (generalized): Secondary | ICD-10-CM

## 2021-07-22 DIAGNOSIS — S46011A Strain of muscle(s) and tendon(s) of the rotator cuff of right shoulder, initial encounter: Secondary | ICD-10-CM | POA: Diagnosis not present

## 2021-07-22 DIAGNOSIS — M25511 Pain in right shoulder: Secondary | ICD-10-CM

## 2021-07-22 DIAGNOSIS — M25611 Stiffness of right shoulder, not elsewhere classified: Secondary | ICD-10-CM

## 2021-07-22 NOTE — Therapy (Signed)
OUTPATIENT PHYSICAL THERAPY SHOULDER TREATMENT   Patient Name: Chad Manning MRN: [id number] DOB:[birth year], 66 y.o., male Today's Date: 07/22/2021     PT End of Session - 07/22/21 1500     Visit Number 2    Number of Visits 28    Date for PT Re-Evaluation 10/09/21    Authorization Type [employer]    PT Start Time 1430    PT Stop Time 1510    PT Time Calculation (min) 40 min    Activity Tolerance Patient tolerated treatment well    Behavior During Therapy WFL for tasks assessed/performed               Past Medical History:  Diagnosis Date   Arthritis    Asthma    x 1 age 68   Hyperlipidemia    Right rotator cuff tear    Past Surgical History:  Procedure Laterality Date   COLONOSCOPY     JOINT REPLACEMENT     SHOULDER ARTHROSCOPY WITH ROTATOR CUFF REPAIR AND OPEN BICEPS TENODESIS Right 07/13/2021   Procedure: RIGHT SHOULDER ARTHROSCOPY WITH ROTATOR CUFF REPAIR AND BICEPS TENODESIS;  Surgeon: Vanetta Mulders, MD;  Location: [place];  Service: Orthopedics;  Laterality: Right;   TOTAL HIP ARTHROPLASTY     bilatera   Patient Active Problem List   Diagnosis Date Noted   Traumatic complete tear of right rotator cuff    Former smoker 12/14/2019   Acquired hallux rigidus of right foot 11/29/2018   Dyslipidemia 03/15/2018   Hyperglycemia 03/15/2018     REFERRING PROVIDER: Vanetta Mulders, MD   REFERRING DIAG: S46.011A (ICD-10-CM) - Traumatic complete tear of right rotator cuff, initial encounter   THERAPY DIAG:  Right shoulder pain, unspecified chronicity  Stiffness of right shoulder, not elsewhere classified  Muscle weakness (generalized)  Rationale for Evaluation and Treatment Rehabilitation  ONSET DATE: Injury 06/05/2021 ; DOS 07/13/2021  SUBJECTIVE:          Shoulder is doing OK, no questions from the evaluation. I want shoulder to feel better trying to do everything right                                                                                                                                                                                SUBJECTIVE STATEMENT: Pt fell down steps on May 26 while he was at the beach.  Pt had a MRI which showed a full-thickness rotator cuff tear.  MRI findings in MD note indicated full-thickness tear involving the supra and infraspinatus as well as a full-thickness tear of the subscapularis muscle. Also there is biceps subluxation.   Pt underwent R shoulder arthroscopic rotator cuff repair, biceps tenodesis, and subacromial decompression  on 07/13/2021.   PT sent message asking if MD repaired supraspinatus, infraspinatus, and subscapularis.  MD replied stating that was correct and to please follow RCR protocol with added subscapularis restrictions.  Post Op Plan (per Op note): He will be nonweightbearing on the right arm in a sling.  He will begin physical therapy immediately postop.  MD to see pt back in 2 weeks for suture removal  Pt is sling dependent and is limited with all self care activities and ADLs/IADLs.  Pt is unable to perform any reaching and overhead activities with R UE.  He enjoys doing work around his home and work on the car though unable to currently.  Pt unable to perform yard work.  Unable to pick up granddaugther   PERTINENT HISTORY: R shoulder arthroscopic rotator cuff repair of supraspinatus, infraspinatus, and subscapularis, biceps tenodesis, and subacromial decompression on 07/13/2021.  MD message indicated subscapularis restrictions also.  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 None at rest NPRS:  Current and best:  0/10, Worst: 2-3/10 Location:  R shoulder  PRECAUTIONS: Other: RCR of supraspinatus, infraspinatus, and subscapularis.  Hx of bilat THA  WEIGHT BEARING RESTRICTIONS Yes NWB R UE    FALLS:  Has patient fallen in last 6 months? Yes. Number of falls 1, this injury  LIVING ENVIRONMENT: Lives with: lives with their spouse Lives in: 1 story  home Stairs: 2 steps without rail to enter home   OCCUPATION: Pt is retired  PLOF: Independent; Pt was able to perform all of his ADLs/IADLs and reaching and overhead activities independently without limitation.  Pt was able to perform yard work and work on car without limitation.  Pt was able to pick up granddaughter.   PATIENT GOALS return to PLOF, have full ROM and strength, to be able to perform car work and yard work, pick up granddaughter  TODAY'S TREATMENT 07/22/21  PROM R shoulder flexion within limits of protocol (less than 140 degrees) PROM R shoulder ABD within limits of protocol (60-80 degree limit 0 degrees ER)  Upper trap stretch 2x30 seconds Levator stretch 2x30 seconds  Scapular retractions 1x10 2 second holds    OBJECTIVE:   DIAGNOSTIC FINDINGS:  Pt had x rays and a MRI prior to surgery.   PATIENT SURVEYS:  UEFI:  0/80  COGNITION:  Overall cognitive status: Within functional limits for tasks assessed     OBSERVATION: Pt in [place] sling with abduction pillow.  Pt has post-operative dressing.  PT removed post-operative dressing.  Pt has 3 portals located anteriorly and 1 posteriorly which are all intact with stitches.  Pt has some expected dried blood though nothing excessive.  Pt has no signs of infection.  PT applied new gauze over portals and new tegaderm over gauze.    Pt is R hand dominant.    UPPER EXTREMITY ROM:   Active ROM Right eval Left eval  Shoulder flexion  169  Shoulder scaption  170  Shoulder abduction  155  Shoulder adduction    Shoulder internal rotation    Shoulder external rotation    Elbow flexion    Elbow extension    Wrist flexion    Wrist extension    Wrist ulnar deviation    Wrist radial deviation    Wrist pronation    Wrist supination    (Blank rows = not tested)  R shoulder ROM not tested  PALPATION:  TTP:  anterior shoulder  TODAY'S TREATMENT:  Pt performed wrist flex/ext AROM x 20 reps in sling  and towel  gripping 2x10 reps in sling.  Pt received a HEP handout and was educated in correct form and appropriate frequency.  Instructed pt to perform HEP in sling.   See below for pt education.    PATIENT EDUCATION: Education details: had lots of questions about general course of care after rotator cuff repair, incision care, precautions and current restrictions, brace fit and positioning answered as appropriate/able Person educated: Patient and Spouse Education method: Explanation, Demonstration, Tactile cues, Verbal cues, and Handouts Education comprehension: verbalized understanding, returned demonstration, verbal cues required, tactile cues required, and needs further education   HOME EXERCISE PROGRAM: Access Code: [email or web address] URL: [email or web address] Date: 07/17/2021 Prepared by: Ronny Flurry  Exercises - Wrist AROM Flexion Extension  - 3 x daily - 7 x weekly - 2-3 sets - 10 reps - Seated Gripping Towel  - 2-3 x daily - 7 x weekly - 2 sets - 10 reps  ASSESSMENT:  CLINICAL IMPRESSION:  Chad Manning arrives today doing OK with his spouse; they were both very anxious and had lots of questions about recovery from surgery, precautions, brace/sling wear and positioning, etc. Worked on PROM of shoulder as protocol allowed within appropriate restrictions, otherwise worked on cervical ROM and ongoing hand/wrist strength. Will continue to progress as able per protocol. Dressings C/D/I, did not change them today.     OBJECTIVE IMPAIRMENTS decreased activity tolerance, decreased endurance, decreased ROM, decreased strength, hypomobility, impaired flexibility, and pain.   ACTIVITY LIMITATIONS carrying, lifting, bathing, dressing, reach over head, hygiene/grooming, and caring for others  PARTICIPATION LIMITATIONS: meal prep, cleaning, community activity, and yard work   [employer] POTENTIAL: Good  CLINICAL DECISION MAKING: Stable/uncomplicated  EVALUATION COMPLEXITY:  Low   GOALS:  SHORT TERM GOALS:   Pt will be independent and compliant with HEP for improved ROM, pain, and function.  Baseline: Goal status: INITIAL Target date: 08/27/2021   2.  Pt will tolerate R shoulder PROM per protocol without significant pain for improved mobility and stiffness Baseline:  Goal status: INITIAL Target date: 08/06/2021   3.  Pt will demo R shoulder PROM to be 140 deg in flexion and 30 deg in ER for improved mobility and stiffness.  Baseline:  Goal status: INITIAL Target date: 08/27/2021   4.  Pt will wean out of sling per MD allowance without adverse effects.  Baseline:  Goal status: INITIAL Target date: 08/27/2021    5.  Pt will tolerate the initiation of shoulder AAROM without adverse effects.  Baseline:  Goal status: INITIAL Target date: 08/27/2021   6.  Pt will demo supine shoulder AAROM to be 120 deg in flexion and in 40 deg in ER for progression of AAROM and protocol Baseline:  Goal status: INITIAL Target date:  09/10/2021  7.  Pt will demo R shoulder flexion and scaption AROM to at least 100 deg in standing without significant shoulder hike for improved UE elevation. Baseline:  Goal status: INITIAL Target date:  10/01/2021  8.  Pt will be able to perform his self care activities with no > than minimal difficulty.   Baseline:  Goal status: INITIAL Target date:  10/08/2021   LONG TERM GOALS: Target date: 11/05/2021   (Remove Blue Hyperlink)  Pt will demo R shoulder AROM to be [facility] t/o for performance of ADLs and IADLs.  Baseline:  Goal status: INITIAL  2.  Pt will be able to perform his ADLs and IADLs  without significant difficulty and pain. Baseline:  Goal status: INITIAL  3.   Pt will be able to perform his normal reaching and overhead activities without significant pain and limitation. Baseline:  Goal status: INITIAL  4.  Pt will demo at least 4/5 MMT strength t/o R shoulder for improved tolerance with and performance of daily  activities, functional lifting, and performance of yard work.  Baseline:  Goal status: INITIAL  5.  Pt will be able to perform appropriate functional carrying and lifting without significant pain in order to perform IADLs and household chores. Baseline:  Goal status: INITIAL  6.  Pt will be independent with advanced HEP for improved shoulder strength and stability to assist with returning to desired level of function and performing car work and yard work. Baseline:  Goal status: INITIAL   PLAN: PT FREQUENCY:  1x/wk x 4 weeks and 2x/wk x 12 weeks  PT DURATION: other: 16 weeks  PLANNED INTERVENTIONS: Therapeutic exercises, Therapeutic activity, Neuromuscular re-education, Patient/Family education, Joint mobilization, Aquatic Therapy, Dry Needling, Electrical stimulation, Spinal mobilization, Cryotherapy, Moist heat, Taping, Ultrasound, Manual therapy, and Re-evaluation  PLAN FOR NEXT SESSION: Cont per Dr. Eddie Dibbles Rotator Cuff Repair protocol with subscapularis restrictions.  Will use subscapularis repair protocol also.  Also has biceps tenodesis restrictions.  Progress slowly, Pt had supraspinatus, infraspinatus, and subscapularis repairs.     Ann Lions PT DPT PN2  07/22/2021, 3:11 PM

## 2021-07-23 ENCOUNTER — Encounter (HOSPITAL_BASED_OUTPATIENT_CLINIC_OR_DEPARTMENT_OTHER): Payer: Self-pay | Admitting: Orthopaedic Surgery

## 2021-07-27 ENCOUNTER — Encounter (HOSPITAL_BASED_OUTPATIENT_CLINIC_OR_DEPARTMENT_OTHER): Payer: Medicare PPO | Admitting: Orthopaedic Surgery

## 2021-07-29 ENCOUNTER — Encounter (HOSPITAL_BASED_OUTPATIENT_CLINIC_OR_DEPARTMENT_OTHER): Payer: Self-pay | Admitting: Physical Therapy

## 2021-07-29 ENCOUNTER — Ambulatory Visit (INDEPENDENT_AMBULATORY_CARE_PROVIDER_SITE_OTHER): Payer: Medicare PPO | Admitting: Orthopaedic Surgery

## 2021-07-29 ENCOUNTER — Ambulatory Visit (HOSPITAL_BASED_OUTPATIENT_CLINIC_OR_DEPARTMENT_OTHER): Payer: Medicare PPO | Admitting: Physical Therapy

## 2021-07-29 DIAGNOSIS — S46011A Strain of muscle(s) and tendon(s) of the rotator cuff of right shoulder, initial encounter: Secondary | ICD-10-CM

## 2021-07-29 DIAGNOSIS — M25611 Stiffness of right shoulder, not elsewhere classified: Secondary | ICD-10-CM

## 2021-07-29 DIAGNOSIS — M6281 Muscle weakness (generalized): Secondary | ICD-10-CM

## 2021-07-29 DIAGNOSIS — M25511 Pain in right shoulder: Secondary | ICD-10-CM

## 2021-07-29 NOTE — Therapy (Signed)
OUTPATIENT PHYSICAL THERAPY SHOULDER TREATMENT   Patient Name: Chad Manning MRN: [id number] DOB:1955-12-17, 66 y.o., male Today's Date: 07/29/2021     PT End of Session - 07/29/21 1453     Visit Number 3    Number of Visits 28    Date for PT Re-Evaluation 10/09/21    Authorization Type [employer]    PT Start Time 1431    PT Stop Time 1510    PT Time Calculation (min) 39 min    Activity Tolerance Patient tolerated treatment well    Behavior During Therapy WFL for tasks assessed/performed                Past Medical History:  Diagnosis Date   Arthritis    Asthma    x 1 age 52   Hyperlipidemia    Right rotator cuff tear    Past Surgical History:  Procedure Laterality Date   COLONOSCOPY     JOINT REPLACEMENT     SHOULDER ARTHROSCOPY WITH ROTATOR CUFF REPAIR AND OPEN BICEPS TENODESIS Right 07/13/2021   Procedure: RIGHT SHOULDER ARTHROSCOPY WITH ROTATOR CUFF REPAIR AND BICEPS TENODESIS;  Surgeon: Vanetta Mulders, MD;  Location: [place];  Service: Orthopedics;  Laterality: Right;   TOTAL HIP ARTHROPLASTY     bilatera   Patient Active Problem List   Diagnosis Date Noted   Traumatic complete tear of right rotator cuff    Former smoker 12/14/2019   Acquired hallux rigidus of right foot 11/29/2018   Dyslipidemia 03/15/2018   Hyperglycemia 03/15/2018     REFERRING PROVIDER: Vanetta Mulders, MD   REFERRING DIAG: S46.011A (ICD-10-CM) - Traumatic complete tear of right rotator cuff, initial encounter   THERAPY DIAG:  Right shoulder pain, unspecified chronicity  Stiffness of right shoulder, not elsewhere classified  Muscle weakness (generalized)  Rationale for Evaluation and Treatment Rehabilitation  ONSET DATE: Injury 06/05/2021 ; DOS 07/13/2021  SUBJECTIVE:                                                                                                                                                                             SUBJECTIVE STATEMENT: Doing OK, just  saw the doctor and he is happy with my progress. I feel like I might be tensing my arm a lot inside the sling. They took out my stitches and redressed my incision at the doctor today.  Wants [place] to start doing more elbow motion in therapy at this point.   PERTINENT HISTORY: R shoulder arthroscopic rotator cuff repair of supraspinatus, infraspinatus, and subscapularis, biceps tenodesis, and subacromial decompression on 07/13/2021.  MD message indicated subscapularis restrictions also.  PMHx:  Bilat THA 20 years ago and arthritis    PAIN:  Are  you having pain? None at rest NPRS:  Current and best:  0/10, Worst: 2-3/10 Location:  R shoulder  PRECAUTIONS: Other: RCR of supraspinatus, infraspinatus, and subscapularis.  Hx of bilat THA  WEIGHT BEARING RESTRICTIONS Yes NWB R UE    FALLS:  Has patient fallen in last 6 months? Yes. Number of falls 1, this injury  LIVING ENVIRONMENT: Lives with: lives with their spouse Lives in: 1 story home Stairs: 2 steps without rail to enter home   OCCUPATION: Pt is retired  PLOF: Independent; Pt was able to perform all of his ADLs/IADLs and reaching and overhead activities independently without limitation.  Pt was able to perform yard work and work on car without limitation.  Pt was able to pick up granddaughter.   PATIENT GOALS return to PLOF, have full ROM and strength, to be able to perform car work and yard work, pick up granddaughter  TODAY'S TREATMENT 07/29/21  PROM R shoulder flexion within limits of protocol (less than 140 degrees) PROM R shoulder ABD within limits of protocol (60-80 degree limit 0 degrees ER)  Elbow flexion/bicep curls 1x20  Wrist pronation/supination 1x20  Chin tucks 1x15 with 3 second holds  Scapular retractions in sling 1x15     OBJECTIVE:   DIAGNOSTIC FINDINGS:  Pt had x rays and a MRI prior to surgery.   PATIENT SURVEYS:  UEFI:  0/80  COGNITION:  Overall cognitive status: Within functional limits for tasks  assessed     OBSERVATION: Pt in [place] sling with abduction pillow.  Pt has post-operative dressing.  PT removed post-operative dressing.  Pt has 3 portals located anteriorly and 1 posteriorly which are all intact with stitches.  Pt has some expected dried blood though nothing excessive.  Pt has no signs of infection.  PT applied new gauze over portals and new tegaderm over gauze.    Pt is R hand dominant.    UPPER EXTREMITY ROM:   Active ROM Right eval Left eval  Shoulder flexion  169  Shoulder scaption  170  Shoulder abduction  155  Shoulder adduction    Shoulder internal rotation    Shoulder external rotation    Elbow flexion    Elbow extension    Wrist flexion    Wrist extension    Wrist ulnar deviation    Wrist radial deviation    Wrist pronation    Wrist supination    (Blank rows = not tested)  R shoulder ROM not tested  PALPATION:  TTP:  anterior shoulder   TODAY'S TREATMENT:  Pt performed wrist flex/ext AROM x 20 reps in sling  and towel gripping 2x10 reps in sling.  Pt received a HEP handout and was educated in correct form and appropriate frequency.  Instructed pt to perform HEP in sling.   See below for pt education.    PATIENT EDUCATION: Education details: reviewed current precautions given multiple areas of surgery/repair, general education on progression forward from here  Person educated: Patient and Spouse Education method: Explanation, Demonstration, Tactile cues, Verbal cues, and Handouts Education comprehension: verbalized understanding, returned demonstration, verbal cues required, tactile cues required, and needs further education   HOME EXERCISE PROGRAM: Access Code: [email or web address] URL: [email or web address] Date: 07/17/2021 Prepared by: Ronny Flurry  Exercises - Wrist AROM Flexion Extension  - 3 x daily - 7 x weekly - 2-3 sets - 10 reps - Seated Gripping Towel  - 2-3 x daily - 7 x weekly - 2 sets - 10  reps  ASSESSMENT:  CLINICAL IMPRESSION:  [name] arrives today doing OK, still just very anxious about his shoulder. Continued with PROM as per protocol, also worked on elbow and wrist exercises as well as postural strength today. Will continue to progress as appropriate.     OBJECTIVE IMPAIRMENTS decreased activity tolerance, decreased endurance, decreased ROM, decreased strength, hypomobility, impaired flexibility, and pain.   ACTIVITY LIMITATIONS carrying, lifting, bathing, dressing, reach over head, hygiene/grooming, and caring for others  PARTICIPATION LIMITATIONS: meal prep, cleaning, community activity, and yard work   [employer] POTENTIAL: Good  CLINICAL DECISION MAKING: Stable/uncomplicated  EVALUATION COMPLEXITY: Low   GOALS:  SHORT TERM GOALS:   Pt will be independent and compliant with HEP for improved ROM, pain, and function.  Baseline: Goal status: INITIAL Target date: 08/27/2021   2.  Pt will tolerate R shoulder PROM per protocol without significant pain for improved mobility and stiffness Baseline:  Goal status: INITIAL Target date: 08/06/2021   3.  Pt will demo R shoulder PROM to be 140 deg in flexion and 30 deg in ER for improved mobility and stiffness.  Baseline:  Goal status: INITIAL Target date: 08/27/2021   4.  Pt will wean out of sling per MD allowance without adverse effects.  Baseline:  Goal status: INITIAL Target date: 08/27/2021    5.  Pt will tolerate the initiation of shoulder AAROM without adverse effects.  Baseline:  Goal status: INITIAL Target date: 08/27/2021   6.  Pt will demo supine shoulder AAROM to be 120 deg in flexion and in 40 deg in ER for progression of AAROM and protocol Baseline:  Goal status: INITIAL Target date:  09/10/2021  7.  Pt will demo R shoulder flexion and scaption AROM to at least 100 deg in standing without significant shoulder hike for improved UE elevation. Baseline:  Goal status: INITIAL Target date:   10/01/2021  8.  Pt will be able to perform his self care activities with no > than minimal difficulty.   Baseline:  Goal status: INITIAL Target date:  10/08/2021   LONG TERM GOALS: Target date: 11/05/2021   (Remove Blue Hyperlink)  Pt will demo R shoulder AROM to be [facility] t/o for performance of ADLs and IADLs.  Baseline:  Goal status: INITIAL  2.  Pt will be able to perform his ADLs and IADLs without significant difficulty and pain. Baseline:  Goal status: INITIAL  3.   Pt will be able to perform his normal reaching and overhead activities without significant pain and limitation. Baseline:  Goal status: INITIAL  4.  Pt will demo at least 4/5 MMT strength t/o R shoulder for improved tolerance with and performance of daily activities, functional lifting, and performance of yard work.  Baseline:  Goal status: INITIAL  5.  Pt will be able to perform appropriate functional carrying and lifting without significant pain in order to perform IADLs and household chores. Baseline:  Goal status: INITIAL  6.  Pt will be independent with advanced HEP for improved shoulder strength and stability to assist with returning to desired level of function and performing car work and yard work. Baseline:  Goal status: INITIAL   PLAN: PT FREQUENCY:  1x/wk x 4 weeks and 2x/wk x 12 weeks  PT DURATION: other: 16 weeks  PLANNED INTERVENTIONS: Therapeutic exercises, Therapeutic activity, Neuromuscular re-education, Patient/Family education, Joint mobilization, Aquatic Therapy, Dry Needling, Electrical stimulation, Spinal mobilization, Cryotherapy, Moist heat, Taping, Ultrasound, Manual therapy, and Re-evaluation  PLAN FOR NEXT SESSION: Cont per Dr. Eddie Dibbles Rotator Cuff Repair  protocol with subscapularis restrictions.  Will use subscapularis repair protocol also.  Also has biceps tenodesis restrictions.  Progress slowly, Pt had supraspinatus, infraspinatus, and subscapularis repairs.     Ann Lions PT  DPT PN2  07/29/2021, 3:12 PM

## 2021-07-29 NOTE — Progress Notes (Signed)
Post Operative Evaluation    Procedure/Date of Surgery: Shoulder rotator cuff repair and subscapularis repair 07/13/2021  Interval History:   Presents today 2 weeks status post the above procedure.  He has been compliant with aspirin.  He is not taking any narcotic pain medication.  Overall he is doing very well.  He has remained in his sling.  He is complaining of some soreness about the neck as well as the trapezius muscle.  He has been compliant with anticoagulation.  He has been working with physical therapy weekly.   PMH/PSH/Family History/Social History/Meds/Allergies:    Past Medical History:  Diagnosis Date   Arthritis    Asthma    x 1 age 35   Hyperlipidemia    Right rotator cuff tear    Past Surgical History:  Procedure Laterality Date   COLONOSCOPY     JOINT REPLACEMENT     SHOULDER ARTHROSCOPY WITH ROTATOR CUFF REPAIR AND OPEN BICEPS TENODESIS Right 07/13/2021   Procedure: RIGHT SHOULDER ARTHROSCOPY WITH ROTATOR CUFF REPAIR AND BICEPS TENODESIS;  Surgeon: Vanetta Mulders, MD;  Location: [place];  Service: Orthopedics;  Laterality: Right;   TOTAL HIP ARTHROPLASTY     bilatera   Social History   Socioeconomic History   Marital status: Married    Spouse name: Not on file   Number of children: 2   Years of education: Not on file   Highest education level: Not on file  Occupational History   Not on file  Tobacco Use   Smoking status: Former    Packs/day: 0.50    Years: 40.00    Total pack years: 20.00    Types: Cigarettes    Quit date: 2010    Years since quitting: 13.5   Smokeless tobacco: Never  Vaping Use   Vaping Use: Never used  Substance and Sexual Activity   Alcohol use: Yes    Alcohol/week: 5.0 - 10.0 standard drinks of alcohol    Types: 5 - 10 Cans of beer per week    Comment: Beer   Drug use: Never   Sexual activity: Yes  Other Topics Concern   Not on file  Social History Narrative   [employer]   Retired-medical  lab/x-ray [employer]   Social Determinants of Health   Financial Resource Strain: Not on file  Food Insecurity: Not on file  Transportation Needs: Not on file  Physical Activity: Not on file  Stress: Not on file  Social Connections: Not on file   Family History  Problem Relation Age of Onset   Heart disease Father    Cancer Mother    Lung cancer Sister        smoker   Colon cancer Neg Hx    Prostate cancer Neg Hx    Esophageal cancer Neg Hx    Rectal cancer Neg Hx    No Known Allergies Current Outpatient Medications  Medication Sig Dispense Refill   acetaminophen (TYLENOL) 500 MG tablet Take 500 mg by mouth every 6 (six) hours as needed.     AMBULATORY NON FORMULARY MEDICATION Lift chair.  Dispense 1.  R26.81. 1 each 0   aspirin EC 325 MG tablet Take 1 tablet (325 mg total) by mouth daily. 30 tablet 0   atorvastatin (LIPITOR) 40 MG tablet TAKE 1 TABLET BY MOUTH DAILY  90 tablet 1   atorvastatin (LIPITOR) 40 MG tablet TAKE 1 TABLET(40 MG) BY MOUTH DAILY 90 tablet 1   diclofenac Sodium (VOLTAREN) 1 % GEL Apply 1 Application topically 4 (four) times daily as needed (knee pain).     docusate sodium (COLACE) 100 MG capsule Take 200 mg by mouth daily.     ibuprofen (ADVIL) 200 MG tablet Take 600 mg by mouth every 8 (eight) hours as needed for moderate pain.     methocarbamol (ROBAXIN) 500 MG tablet Take 2 tablets by mouth every 8 hours as needed for 30 days. (Patient not taking: Reported on 07/10/2021) 180 tablet 0   oxyCODONE (OXY IR/ROXICODONE) 5 MG immediate release tablet Take 1 tablet (5 mg total) by mouth every 4 (four) hours as needed (severe pain). (Patient not taking: Reported on 07/10/2021) 20 tablet 0   No current facility-administered medications for this visit.   No results found.  Review of Systems:   A ROS was performed including pertinent positives and negatives as documented in the HPI.   Musculoskeletal Exam:    Right shoulder portals are clean dry and intact without  erythema or drainage.  He is able to flex and extend at the right elbow.  Fires wrist extensors.  2+ radial pulse.  Sensation is intact in all distributions of the right arm.  Passive forward elevation was to 90 degrees in the supine position without difficulty with external rotation of the side easily to 30 degrees  Imaging:    None  I personally reviewed and interpreted the radiographs.   Assessment:   66 year old male who is 2 weeks status post right shoulder rotator cuff repair with subscapularis repair and biceps tenodesis overall doing very well.  At this time I would like him to progress along the rotator cuff repair protocol.  He will continue passive range of motion.  I will plan to see him back in 4 weeks for reassessment  Plan :    -Return to clinic in 4 weeks for reassessment      I personally saw and evaluated the patient, and participated in the management and treatment plan.  Vanetta Mulders, MD Attending Physician, Orthopedic Surgery  This document was dictated using Dragon voice recognition software. A reasonable attempt at proof reading has been made to minimize errors.

## 2021-08-04 NOTE — Therapy (Signed)
OUTPATIENT PHYSICAL THERAPY SHOULDER TREATMENT   Patient Name: Chad Manning MRN: [id number] DOB:[birth year], 66 y.o., male Today's Date: 08/06/2021     PT End of Session - 08/05/21 1644     Visit Number 4    Number of Visits 28    Date for PT Re-Evaluation 10/09/21    Authorization Type [employer]    PT Start Time 4431    PT Stop Time 1610    PT Time Calculation (min) 40 min    Activity Tolerance Patient tolerated treatment well    Behavior During Therapy WFL for tasks assessed/performed                 Past Medical History:  Diagnosis Date   Arthritis    Asthma    x 1 age 89   Hyperlipidemia    Right rotator cuff tear    Past Surgical History:  Procedure Laterality Date   COLONOSCOPY     JOINT REPLACEMENT     SHOULDER ARTHROSCOPY WITH ROTATOR CUFF REPAIR AND OPEN BICEPS TENODESIS Right 07/13/2021   Procedure: RIGHT SHOULDER ARTHROSCOPY WITH ROTATOR CUFF REPAIR AND BICEPS TENODESIS;  Surgeon: Vanetta Mulders, MD;  Location: [place];  Service: Orthopedics;  Laterality: Right;   TOTAL HIP ARTHROPLASTY     bilatera   Patient Active Problem List   Diagnosis Date Noted   Traumatic complete tear of right rotator cuff    Former smoker 12/14/2019   Acquired hallux rigidus of right foot 11/29/2018   Dyslipidemia 03/15/2018   Hyperglycemia 03/15/2018     REFERRING PROVIDER: Vanetta Mulders, MD   REFERRING DIAG: S46.011A (ICD-10-CM) - Traumatic complete tear of right rotator cuff, initial encounter   THERAPY DIAG:  Right shoulder pain, unspecified chronicity  Stiffness of right shoulder, not elsewhere classified  Muscle weakness (generalized)  Rationale for Evaluation and Treatment Rehabilitation  ONSET DATE: Injury 06/05/2021 ; DOS 07/13/2021  SUBJECTIVE:                                                                                                                                                                             SUBJECTIVE STATEMENT: Pt is 3 weeks  and 2 days s/p supraspinatus, infraspinatus, and subscapularis repairs, biceps tenodesis, and subacromial decompression.   Pt denies any adverse effects after prior Rx.  Pt reports no pain currently.  Pt is compliant with sling usage.     PERTINENT HISTORY: R shoulder arthroscopic rotator cuff repair of supraspinatus, infraspinatus, and subscapularis, biceps tenodesis, and subacromial decompression on 07/13/2021.  MD message indicated subscapularis restrictions also.  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 None at rest NPRS:  Current and  best:  0/10, Worst: 2-3/10 Location:  R shoulder  PRECAUTIONS: Other: RCR of supraspinatus, infraspinatus, and subscapularis.  Hx of bilat THA  WEIGHT BEARING RESTRICTIONS Yes NWB R UE    FALLS:  Has patient fallen in last 6 months? Yes. Number of falls 1, this injury  LIVING ENVIRONMENT: Lives with: lives with their spouse Lives in: 1 story home Stairs: 2 steps without rail to enter home   OCCUPATION: Pt is retired  PLOF: Independent; Pt was able to perform all of his ADLs/IADLs and reaching and overhead activities independently without limitation.  Pt was able to perform yard work and work on car without limitation.  Pt was able to pick up granddaughter.   PATIENT GOALS return to PLOF, have full ROM and strength, to be able to perform car work and yard work, pick up granddaughter  TODAY'S TREATMENT 07/29/21  PROM R shoulder flexion within limits of protocol (less than 140 degrees) PROM R shoulder ABD within limits of protocol (60-80 degree limit 0 degrees ER)  Elbow flexion/bicep curls 1x20  Wrist pronation/supination 1x20  Chin tucks 1x15 with 3 second holds  Scapular retractions in sling 1x15     OBJECTIVE:   DIAGNOSTIC FINDINGS:  Pt had x rays and a MRI prior to surgery.     TODAY'S TREATMENT:  Therapeutic Exercise:  See below for pt education.  ER PROM:  0 deg  Pt performed:       Pendulums x 20 reps cw and ccw    Yellow Putty grip 3x10 reps     Educated pt concerning HEP.    Manual Therapy: Pt received gentle distraction with oscillation and Grade I-II posterior and inferior jt mobs to R GH joint in supine to improve pain, ROM, and to normalize arthrokinematics f/b R shoulder PROM in flexion and abduction per protocol ranges.     PATIENT EDUCATION: Education details:  protocol restrictions, POC, HEP, dx, relevant anatomy, and exercise form.  PT answered pt and Pt's wife's questions.  Person educated: Patient and Spouse Education method: Explanation, Demonstration, Tactile cues, and Verbal cues Education comprehension: verbalized understanding, returned demonstration, verbal cues required, tactile cues required, and needs further education   HOME EXERCISE PROGRAM: Access Code: [email or web address] URL: [email or web address] Date: 07/17/2021 Prepared by: Ronny Flurry  Exercises - Wrist AROM Flexion Extension  - 3 x daily - 7 x weekly - 2-3 sets - 10 reps - Seated Gripping Towel  - 2-3 x daily - 7 x weekly - 2 sets - 10 reps Educated pt he could perform putty gripping at home instead of towel gripping  ASSESSMENT:  CLINICAL IMPRESSION: Pt is progressing well with R shoulder PROM.  He demonstrates good shoulder PROM at this time per protocol.  Pt performed pendulums with good form with cuing and had no pain with pendulums.  Pt responded well to Rx having no pain after Rx.  He should benefit from continued skilled PT services per protocol to improve ROM and to restore desired level of function.        OBJECTIVE IMPAIRMENTS decreased activity tolerance, decreased endurance, decreased ROM, decreased strength, hypomobility, impaired flexibility, and pain.   ACTIVITY LIMITATIONS carrying, lifting, bathing, dressing, reach over head, hygiene/grooming, and caring for others  PARTICIPATION LIMITATIONS: meal prep, cleaning, community activity, and yard  work   [employer] POTENTIAL: Good  CLINICAL DECISION MAKING: Stable/uncomplicated  EVALUATION COMPLEXITY: Low   GOALS:  SHORT TERM GOALS:   Pt will be independent and compliant with HEP for improved ROM,  pain, and function.  Baseline: Goal status: INITIAL Target date: 08/27/2021   2.  Pt will tolerate R shoulder PROM per protocol without significant pain for improved mobility and stiffness Baseline:  Goal status: INITIAL Target date: 08/06/2021   3.  Pt will demo R shoulder PROM to be 140 deg in flexion and 30 deg in ER for improved mobility and stiffness.  Baseline:  Goal status: INITIAL Target date: 08/27/2021   4.  Pt will wean out of sling per MD allowance without adverse effects.  Baseline:  Goal status: INITIAL Target date: 08/27/2021    5.  Pt will tolerate the initiation of shoulder AAROM without adverse effects.  Baseline:  Goal status: INITIAL Target date: 08/27/2021   6.  Pt will demo supine shoulder AAROM to be 120 deg in flexion and in 40 deg in ER for progression of AAROM and protocol Baseline:  Goal status: INITIAL Target date:  09/10/2021  7.  Pt will demo R shoulder flexion and scaption AROM to at least 100 deg in standing without significant shoulder hike for improved UE elevation. Baseline:  Goal status: INITIAL Target date:  10/01/2021  8.  Pt will be able to perform his self care activities with no > than minimal difficulty.   Baseline:  Goal status: INITIAL Target date:  10/08/2021   LONG TERM GOALS: Target date: 11/05/2021   (Remove Blue Hyperlink)  Pt will demo R shoulder AROM to be [facility] t/o for performance of ADLs and IADLs.  Baseline:  Goal status: INITIAL  2.  Pt will be able to perform his ADLs and IADLs without significant difficulty and pain. Baseline:  Goal status: INITIAL  3.   Pt will be able to perform his normal reaching and overhead activities without significant pain and limitation. Baseline:  Goal status: INITIAL  4.  Pt  will demo at least 4/5 MMT strength t/o R shoulder for improved tolerance with and performance of daily activities, functional lifting, and performance of yard work.  Baseline:  Goal status: INITIAL  5.  Pt will be able to perform appropriate functional carrying and lifting without significant pain in order to perform IADLs and household chores. Baseline:  Goal status: INITIAL  6.  Pt will be independent with advanced HEP for improved shoulder strength and stability to assist with returning to desired level of function and performing car work and yard work. Baseline:  Goal status: INITIAL   PLAN: PT FREQUENCY:  1x/wk x 4 weeks and 2x/wk x 12 weeks  PT DURATION: other: 16 weeks  PLANNED INTERVENTIONS: Therapeutic exercises, Therapeutic activity, Neuromuscular re-education, Patient/Family education, Joint mobilization, Aquatic Therapy, Dry Needling, Electrical stimulation, Spinal mobilization, Cryotherapy, Moist heat, Taping, Ultrasound, Manual therapy, and Re-evaluation  PLAN FOR NEXT SESSION: Cont per Dr. Eddie Dibbles Rotator Cuff Repair protocol with subscapularis restrictions.  Will use subscapularis repair protocol also.  Also has biceps tenodesis restrictions.  Progress slowly, Pt had supraspinatus, infraspinatus, and subscapularis repairs.     Selinda Michaels III PT, DPT 08/06/21 5:36 PM

## 2021-08-05 ENCOUNTER — Encounter (HOSPITAL_BASED_OUTPATIENT_CLINIC_OR_DEPARTMENT_OTHER): Payer: Self-pay | Admitting: Physical Therapy

## 2021-08-05 ENCOUNTER — Ambulatory Visit (HOSPITAL_BASED_OUTPATIENT_CLINIC_OR_DEPARTMENT_OTHER): Payer: Medicare PPO | Admitting: Physical Therapy

## 2021-08-05 DIAGNOSIS — M25511 Pain in right shoulder: Secondary | ICD-10-CM

## 2021-08-05 DIAGNOSIS — S46011A Strain of muscle(s) and tendon(s) of the rotator cuff of right shoulder, initial encounter: Secondary | ICD-10-CM | POA: Diagnosis not present

## 2021-08-05 DIAGNOSIS — M25611 Stiffness of right shoulder, not elsewhere classified: Secondary | ICD-10-CM

## 2021-08-05 DIAGNOSIS — M6281 Muscle weakness (generalized): Secondary | ICD-10-CM

## 2021-08-12 ENCOUNTER — Encounter (HOSPITAL_BASED_OUTPATIENT_CLINIC_OR_DEPARTMENT_OTHER): Payer: Self-pay | Admitting: Physical Therapy

## 2021-08-13 ENCOUNTER — Encounter (HOSPITAL_BASED_OUTPATIENT_CLINIC_OR_DEPARTMENT_OTHER): Payer: Self-pay | Admitting: Physical Therapy

## 2021-08-13 ENCOUNTER — Ambulatory Visit (HOSPITAL_BASED_OUTPATIENT_CLINIC_OR_DEPARTMENT_OTHER): Payer: Medicare PPO | Attending: Orthopaedic Surgery | Admitting: Physical Therapy

## 2021-08-13 DIAGNOSIS — M25611 Stiffness of right shoulder, not elsewhere classified: Secondary | ICD-10-CM | POA: Diagnosis present

## 2021-08-13 DIAGNOSIS — M6281 Muscle weakness (generalized): Secondary | ICD-10-CM | POA: Insufficient documentation

## 2021-08-13 DIAGNOSIS — M25511 Pain in right shoulder: Secondary | ICD-10-CM | POA: Insufficient documentation

## 2021-08-13 NOTE — Therapy (Signed)
OUTPATIENT PHYSICAL THERAPY SHOULDER TREATMENT   Patient Name: Chad Manning MRN: [id number] DOB:[birth year], 66 y.o., male Today's Date: 08/13/2021     PT End of Session - 08/13/21 0930     Visit Number 5    Number of Visits 28    Date for PT Re-Evaluation 10/09/21    Authorization Type [employer]    PT Start Time 0930    PT Stop Time 1008    PT Time Calculation (min) 38 min    Activity Tolerance Patient tolerated treatment well    Behavior During Therapy WFL for tasks assessed/performed                  Past Medical History:  Diagnosis Date   Arthritis    Asthma    x 1 age 68   Hyperlipidemia    Right rotator cuff tear    Past Surgical History:  Procedure Laterality Date   COLONOSCOPY     JOINT REPLACEMENT     SHOULDER ARTHROSCOPY WITH ROTATOR CUFF REPAIR AND OPEN BICEPS TENODESIS Right 07/13/2021   Procedure: RIGHT SHOULDER ARTHROSCOPY WITH ROTATOR CUFF REPAIR AND BICEPS TENODESIS;  Surgeon: Vanetta Mulders, MD;  Location: [facility];  Service: Orthopedics;  Laterality: Right;   TOTAL HIP ARTHROPLASTY     bilatera   Patient Active Problem List   Diagnosis Date Noted   Traumatic complete tear of right rotator cuff    Former smoker 12/14/2019   Acquired hallux rigidus of right foot 11/29/2018   Dyslipidemia 03/15/2018   Hyperglycemia 03/15/2018     REFERRING PROVIDER: Vanetta Mulders, MD   REFERRING DIAG: S46.011A (ICD-10-CM) - Traumatic complete tear of right rotator cuff, initial encounter   THERAPY DIAG:  Right shoulder pain, unspecified chronicity  Stiffness of right shoulder, not elsewhere classified  Muscle weakness (generalized)  Rationale for Evaluation and Treatment Rehabilitation  ONSET DATE: Injury 06/05/2021 ; DOS 07/13/2021  SUBJECTIVE:                                                                                                                                                                             SUBJECTIVE STATEMENT: 4 weeks post  op. Exercises are going just fine.    PERTINENT HISTORY: R shoulder arthroscopic rotator cuff repair of supraspinatus, infraspinatus, and subscapularis, biceps tenodesis, and subacromial decompression on 07/13/2021.  MD message indicated subscapularis restrictions also.  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 None at rest   PRECAUTIONS: Other: RCR of supraspinatus, infraspinatus, and subscapularis.  Hx of bilat THA  WEIGHT BEARING RESTRICTIONS Yes NWB R UE    FALLS:  Has patient fallen in last 6 months? Yes.  Number of falls 1, this injury  LIVING ENVIRONMENT: Lives with: lives with their spouse Lives in: 1 story home Stairs: 2 steps without rail to enter home   OCCUPATION: Pt is retired  PLOF: Independent; Pt was able to perform all of his ADLs/IADLs and reaching and overhead activities independently without limitation.  Pt was able to perform yard work and work on car without limitation.  Pt was able to pick up granddaughter.   PATIENT GOALS return to PLOF, have full ROM and strength, to be able to perform car work and yard work, pick up granddaughter  OBJECTIVE:   DIAGNOSTIC FINDINGS:  Pt had x rays and a MRI prior to surgery.     TODAY'S TREATMENT:   MANUAL- passive shoulder ROM per protocol, STM upper trap, levator  Supine AAROM flexion with wand to 90 Scapular retraction Scap retraction + small range active ER Active biceps curls from arm rest in chair   PATIENT EDUCATION: Education details:  Anatomy of condition, POC, HEP, exercise form/rationale Person educated: Patient and Spouse Education method: Explanation, Demonstration, Tactile cues, and Verbal cues Education comprehension: verbalized understanding, returned demonstration, verbal cues required, tactile cues required, and needs further education   HOME EXERCISE PROGRAM: Access Code: [email or web address] URL: [email or web address]   ASSESSMENT:  CLINICAL  IMPRESSION: Tolerated progressions well. Discussed importance of pain being the primary driver.      OBJECTIVE IMPAIRMENTS decreased activity tolerance, decreased endurance, decreased ROM, decreased strength, hypomobility, impaired flexibility, and pain.   ACTIVITY LIMITATIONS carrying, lifting, bathing, dressing, reach over head, hygiene/grooming, and caring for others  PARTICIPATION LIMITATIONS: meal prep, cleaning, community activity, and yard work   [employer] POTENTIAL: Good  CLINICAL DECISION MAKING: Stable/uncomplicated  EVALUATION COMPLEXITY: Low   GOALS:  SHORT TERM GOALS:   Pt will be independent and compliant with HEP for improved ROM, pain, and function.  Baseline: Goal status: INITIAL Target date: 08/27/2021   2.  Pt will tolerate R shoulder PROM per protocol without significant pain for improved mobility and stiffness Baseline:  Goal status:achieved Target date: 08/06/2021   3.  Pt will demo R shoulder PROM to be 140 deg in flexion and 30 deg in ER for improved mobility and stiffness.  Baseline:  Goal status: INITIAL Target date: 08/27/2021   4.  Pt will wean out of sling per MD allowance without adverse effects.  Baseline:  Goal status: INITIAL Target date: 08/27/2021    5.  Pt will tolerate the initiation of shoulder AAROM without adverse effects.  Baseline:  Goal status: INITIAL Target date: 08/27/2021   6.  Pt will demo supine shoulder AAROM to be 120 deg in flexion and in 40 deg in ER for progression of AAROM and protocol Baseline:  Goal status: INITIAL Target date:  09/10/2021  7.  Pt will demo R shoulder flexion and scaption AROM to at least 100 deg in standing without significant shoulder hike for improved UE elevation. Baseline:  Goal status: INITIAL Target date:  10/01/2021  8.  Pt will be able to perform his self care activities with no > than minimal difficulty.   Baseline:  Goal status: INITIAL Target date:  10/08/2021   LONG TERM GOALS:  Target date: 11/05/2021   (Remove Blue Hyperlink)  Pt will demo R shoulder AROM to be [facility] t/o for performance of ADLs and IADLs.  Baseline:  Goal status: INITIAL  2.  Pt will be able to perform his ADLs and IADLs without significant difficulty and pain. Baseline:  Goal status:  INITIAL  3.   Pt will be able to perform his normal reaching and overhead activities without significant pain and limitation. Baseline:  Goal status: INITIAL  4.  Pt will demo at least 4/5 MMT strength t/o R shoulder for improved tolerance with and performance of daily activities, functional lifting, and performance of yard work.  Baseline:  Goal status: INITIAL  5.  Pt will be able to perform appropriate functional carrying and lifting without significant pain in order to perform IADLs and household chores. Baseline:  Goal status: INITIAL  6.  Pt will be independent with advanced HEP for improved shoulder strength and stability to assist with returning to desired level of function and performing car work and yard work. Baseline:  Goal status: INITIAL   PLAN: PT FREQUENCY:  1x/wk x 4 weeks and 2x/wk x 12 weeks  PT DURATION: other: 16 weeks  PLANNED INTERVENTIONS: Therapeutic exercises, Therapeutic activity, Neuromuscular re-education, Patient/Family education, Joint mobilization, Aquatic Therapy, Dry Needling, Electrical stimulation, Spinal mobilization, Cryotherapy, Moist heat, Taping, Ultrasound, Manual therapy, and Re-evaluation  PLAN FOR NEXT SESSION: Cont per Dr. Eddie Dibbles Rotator Cuff Repair protocol with subscapularis restrictions.  Will use subscapularis repair protocol also.  Also has biceps tenodesis restrictions.  Progress slowly, Pt had supraspinatus, infraspinatus, and subscapularis repairs.    Ivyonna Hoelzel C. Moss Berry PT, DPT 08/13/21 10:09 AM

## 2021-08-14 ENCOUNTER — Encounter (HOSPITAL_BASED_OUTPATIENT_CLINIC_OR_DEPARTMENT_OTHER): Payer: Medicare PPO | Admitting: Physical Therapy

## 2021-08-18 ENCOUNTER — Encounter (HOSPITAL_BASED_OUTPATIENT_CLINIC_OR_DEPARTMENT_OTHER): Payer: Medicare PPO | Admitting: Physical Therapy

## 2021-08-20 ENCOUNTER — Encounter (HOSPITAL_BASED_OUTPATIENT_CLINIC_OR_DEPARTMENT_OTHER): Payer: Medicare PPO | Admitting: Physical Therapy

## 2021-08-23 NOTE — Therapy (Signed)
OUTPATIENT PHYSICAL THERAPY SHOULDER TREATMENT   Patient Name: Chad Manning MRN: [id number] DOB:1955-03-02, 66 y.o., male Today's Date: 08/24/2021     PT End of Session - 08/24/21 1026     Visit Number 6    Number of Visits 28    Date for PT Re-Evaluation 10/09/21    Authorization Type [employer]    PT Start Time 1025    PT Stop Time 1106    PT Time Calculation (min) 41 min    Activity Tolerance Patient tolerated treatment well    Behavior During Therapy WFL for tasks assessed/performed                  Past Medical History:  Diagnosis Date   Arthritis    Asthma    x 1 age 57   Hyperlipidemia    Right rotator cuff tear    Past Surgical History:  Procedure Laterality Date   COLONOSCOPY     JOINT REPLACEMENT     SHOULDER ARTHROSCOPY WITH ROTATOR CUFF REPAIR AND OPEN BICEPS TENODESIS Right 07/13/2021   Procedure: RIGHT SHOULDER ARTHROSCOPY WITH ROTATOR CUFF REPAIR AND BICEPS TENODESIS;  Surgeon: Vanetta Mulders, MD;  Location: [facility];  Service: Orthopedics;  Laterality: Right;   TOTAL HIP ARTHROPLASTY     bilatera   Patient Active Problem List   Diagnosis Date Noted   Traumatic complete tear of right rotator cuff    Former smoker 12/14/2019   Acquired hallux rigidus of right foot 11/29/2018   Dyslipidemia 03/15/2018   Hyperglycemia 03/15/2018     REFERRING PROVIDER: Vanetta Mulders, MD   REFERRING DIAG: S46.011A (ICD-10-CM) - Traumatic complete tear of right rotator cuff, initial encounter   THERAPY DIAG:  Right shoulder pain, unspecified chronicity  Stiffness of right shoulder, not elsewhere classified  Muscle weakness (generalized)  Rationale for Evaluation and Treatment Rehabilitation  ONSET DATE: Injury 06/05/2021 ; DOS 07/13/2021  SUBJECTIVE:                                                                                                                                                                             SUBJECTIVE STATEMENT: Pt is 6 weeks  s/p supraspinatus, infraspinatus, and subscapularis repairs, biceps tenodesis, and subacromial decompression.   Pt denies any adverse effects after prior Rx.  Pt reports no pain currently.  Pt is compliant with sling usage.  Pt reports compliance with HEP.     PERTINENT HISTORY: R shoulder arthroscopic rotator cuff repair of supraspinatus, infraspinatus, and subscapularis, biceps tenodesis, and subacromial decompression on 07/13/2021.  MD message indicated subscapularis restrictions also.  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 None at rest  NPRS:  Current and best:  0/10 Location:  R shoulder  PRECAUTIONS: Other: RCR of supraspinatus, infraspinatus, and subscapularis.  Hx of bilat THA  WEIGHT BEARING RESTRICTIONS Yes NWB R UE    FALLS:  Has patient fallen in last 6 months? Yes. Number of falls 1, this injury  LIVING ENVIRONMENT: Lives with: lives with their spouse Lives in: 1 story home Stairs: 2 steps without rail to enter home   OCCUPATION: Pt is retired  PLOF: Independent; Pt was able to perform all of his ADLs/IADLs and reaching and overhead activities independently without limitation.  Pt was able to perform yard work and work on car without limitation.  Pt was able to pick up granddaughter.   PATIENT GOALS return to PLOF, have full ROM and strength, to be able to perform car work and yard work, pick up granddaughter      OBJECTIVE:   DIAGNOSTIC FINDINGS:  Pt had x rays and a MRI prior to surgery.     TODAY'S TREATMENT:  Therapeutic Exercise:  Shoulder ROM: Flexion:  AAROM/PROM:  130/146 deg ER PROM:  37 deg  Pt performed:      Pendulums x 20 reps cw, ccw, and f/b    Scap retractions x 10 reps    Supine shoulder flexion AAROM 2x10    Seated table slides in flexion 2x10 reps    See below for pt education.  Manual Therapy: Pt received gentle distraction with oscillation and Grade II posterior and inferior jt mobs to R GH  joint in supine to improve pain, ROM, and to normalize arthrokinematics f/b R shoulder PROM in flexion, abduction, and eR per protocol ranges.     PATIENT EDUCATION: Education details:  Educated pt concerning MD message of removing sling and educated pt with weaning out of sling.  PT answered Pt's questions.  protocol restrictions, POC, HEP, dx, relevant anatomy, and exercise form.  PT answered pt and Pt's wife's questions.  Person educated: Patient and Spouse Education method: Explanation, Demonstration, Tactile cues, and Verbal cues Education comprehension: verbalized understanding, returned demonstration, verbal cues required, tactile cues required, and needs further education   HOME EXERCISE PROGRAM: Access Code: [email or web address] URL: [email or web address] Date: 07/17/2021 Prepared by: Ronny Flurry  Exercises - Wrist AROM Flexion Extension  - 3 x daily - 7 x weekly - 2-3 sets - 10 reps - Seated Gripping Towel  - 2-3 x daily - 7 x weekly - 2 sets - 10 reps Educated pt he could perform putty gripping at home instead of towel gripping  ASSESSMENT:  CLINICAL IMPRESSION: Pt is making great progress with R shoulder ROM.  He demonstrates good shoulder PROM at this time per protocol.  Pt has progressed with AAROM and performs AAROM well without c/o's.  PT sent message to MD asking about sling and MD returned a message stating pt to remove sling.  PT educated pt with weaning out of sling.  He responded well to Rx having no pain during and after Rx.  He should benefit from continued skilled PT services per protocol to improve ROM and to restore desired level of function.          OBJECTIVE IMPAIRMENTS decreased activity tolerance, decreased endurance, decreased ROM, decreased strength, hypomobility, impaired flexibility, and pain.   ACTIVITY LIMITATIONS carrying, lifting, bathing, dressing, reach over head, hygiene/grooming, and caring for others  PARTICIPATION LIMITATIONS: meal  prep, cleaning, community activity, and yard work   [employer] POTENTIAL: Good  CLINICAL DECISION MAKING: Stable/uncomplicated  EVALUATION COMPLEXITY:  Low   GOALS:  SHORT TERM GOALS:   Pt will be independent and compliant with HEP for improved ROM, pain, and function.  Baseline: Goal status: INITIAL Target date: 08/27/2021   2.  Pt will tolerate R shoulder PROM per protocol without significant pain for improved mobility and stiffness Baseline:  Goal status: INITIAL Target date: 08/06/2021   3.  Pt will demo R shoulder PROM to be 140 deg in flexion and 30 deg in ER for improved mobility and stiffness.  Baseline:  Goal status: INITIAL Target date: 08/27/2021   4.  Pt will wean out of sling per MD allowance without adverse effects.  Baseline:  Goal status: INITIAL Target date: 08/27/2021    5.  Pt will tolerate the initiation of shoulder AAROM without adverse effects.  Baseline:  Goal status: INITIAL Target date: 08/27/2021   6.  Pt will demo supine shoulder AAROM to be 120 deg in flexion and in 40 deg in ER for progression of AAROM and protocol Baseline:  Goal status: INITIAL Target date:  09/10/2021  7.  Pt will demo R shoulder flexion and scaption AROM to at least 100 deg in standing without significant shoulder hike for improved UE elevation. Baseline:  Goal status: INITIAL Target date:  10/01/2021  8.  Pt will be able to perform his self care activities with no > than minimal difficulty.   Baseline:  Goal status: INITIAL Target date:  10/08/2021   LONG TERM GOALS: Target date: 11/05/2021   (Remove Blue Hyperlink)  Pt will demo R shoulder AROM to be [facility] t/o for performance of ADLs and IADLs.  Baseline:  Goal status: INITIAL  2.  Pt will be able to perform his ADLs and IADLs without significant difficulty and pain. Baseline:  Goal status: INITIAL  3.   Pt will be able to perform his normal reaching and overhead activities without significant pain and  limitation. Baseline:  Goal status: INITIAL  4.  Pt will demo at least 4/5 MMT strength t/o R shoulder for improved tolerance with and performance of daily activities, functional lifting, and performance of yard work.  Baseline:  Goal status: INITIAL  5.  Pt will be able to perform appropriate functional carrying and lifting without significant pain in order to perform IADLs and household chores. Baseline:  Goal status: INITIAL  6.  Pt will be independent with advanced HEP for improved shoulder strength and stability to assist with returning to desired level of function and performing car work and yard work. Baseline:  Goal status: INITIAL   PLAN: PT FREQUENCY:  1x/wk x 4 weeks and 2x/wk x 12 weeks  PT DURATION: other: 16 weeks  PLANNED INTERVENTIONS: Therapeutic exercises, Therapeutic activity, Neuromuscular re-education, Patient/Family education, Joint mobilization, Aquatic Therapy, Dry Needling, Electrical stimulation, Spinal mobilization, Cryotherapy, Moist heat, Taping, Ultrasound, Manual therapy, and Re-evaluation  PLAN FOR NEXT SESSION: Received message from MD that pt can remove sling.  Cont per Dr. Eddie Dibbles Rotator Cuff Repair protocol with subscapularis restrictions.  Will use subscapularis repair protocol also.  Also has biceps tenodesis restrictions.  Progress slowly, Pt had supraspinatus, infraspinatus, and subscapularis repairs.        Selinda Michaels III PT, DPT 08/24/21 5:05 PM

## 2021-08-24 ENCOUNTER — Ambulatory Visit (HOSPITAL_BASED_OUTPATIENT_CLINIC_OR_DEPARTMENT_OTHER): Payer: Medicare PPO | Admitting: Physical Therapy

## 2021-08-24 ENCOUNTER — Encounter (HOSPITAL_BASED_OUTPATIENT_CLINIC_OR_DEPARTMENT_OTHER): Payer: Self-pay | Admitting: Physical Therapy

## 2021-08-24 DIAGNOSIS — M25511 Pain in right shoulder: Secondary | ICD-10-CM | POA: Diagnosis not present

## 2021-08-24 DIAGNOSIS — M25611 Stiffness of right shoulder, not elsewhere classified: Secondary | ICD-10-CM

## 2021-08-24 DIAGNOSIS — M6281 Muscle weakness (generalized): Secondary | ICD-10-CM

## 2021-08-26 NOTE — Therapy (Signed)
OUTPATIENT PHYSICAL THERAPY SHOULDER TREATMENT   Patient Name: Chad Manning MRN: [id number] DOB:18-May-1955, 66 y.o., male Today's Date: 08/28/2021     PT End of Session - 08/27/21 1019     Visit Number 7    Number of Visits 28    Date for PT Re-Evaluation 10/09/21    Authorization Type [employer]    PT Start Time 3875    PT Stop Time 1056    PT Time Calculation (min) 41 min    Activity Tolerance Patient tolerated treatment well;No increased pain    Behavior During Therapy WFL for tasks assessed/performed                   Past Medical History:  Diagnosis Date   Arthritis    Asthma    x 1 age 59   Hyperlipidemia    Right rotator cuff tear    Past Surgical History:  Procedure Laterality Date   COLONOSCOPY     JOINT REPLACEMENT     SHOULDER ARTHROSCOPY WITH ROTATOR CUFF REPAIR AND OPEN BICEPS TENODESIS Right 07/13/2021   Procedure: RIGHT SHOULDER ARTHROSCOPY WITH ROTATOR CUFF REPAIR AND BICEPS TENODESIS;  Surgeon: Vanetta Mulders, MD;  Location: [facility];  Service: Orthopedics;  Laterality: Right;   TOTAL HIP ARTHROPLASTY     bilatera   Patient Active Problem List   Diagnosis Date Noted   Traumatic complete tear of right rotator cuff    Former smoker 12/14/2019   Acquired hallux rigidus of right foot 11/29/2018   Dyslipidemia 03/15/2018   Hyperglycemia 03/15/2018     REFERRING PROVIDER: Vanetta Mulders, MD   REFERRING DIAG: S46.011A (ICD-10-CM) - Traumatic complete tear of right rotator cuff, initial encounter   THERAPY DIAG:  Right shoulder pain, unspecified chronicity  Stiffness of right shoulder, not elsewhere classified  Muscle weakness (generalized)  Rationale for Evaluation and Treatment Rehabilitation  ONSET DATE: Injury 06/05/2021 ; DOS 07/13/2021  SUBJECTIVE:                                                                                                                                                                             SUBJECTIVE  STATEMENT: Pt is 6 weeks and 3 days s/p supraspinatus, infraspinatus, and subscapularis repairs, biceps tenodesis, and subacromial decompression.   Pt denies any adverse effects after prior Rx.  Pt reports no pain currently.  Pt enters clinic without sling.  He reports no increased pain since being out of sling.  Pt reports compliance with HEP.  Pt saw MD this AM and he is pleased with progress and instructed to progress with ROM.    PERTINENT HISTORY: R shoulder arthroscopic rotator cuff repair of supraspinatus, infraspinatus, and subscapularis, biceps tenodesis, and  subacromial decompression on 07/13/2021.  MD message indicated subscapularis restrictions also.  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 None at rest NPRS:  Current and best:  0/10 Location:  R shoulder  PRECAUTIONS: Other: RCR of supraspinatus, infraspinatus, and subscapularis.  Hx of bilat THA  WEIGHT BEARING RESTRICTIONS Yes NWB R UE    FALLS:  Has patient fallen in last 6 months? Yes. Number of falls 1, this injury  LIVING ENVIRONMENT: Lives with: lives with their spouse Lives in: 1 story home Stairs: 2 steps without rail to enter home   OCCUPATION: Pt is retired  PLOF: Independent; Pt was able to perform all of his ADLs/IADLs and reaching and overhead activities independently without limitation.  Pt was able to perform yard work and work on car without limitation.  Pt was able to pick up granddaughter.   PATIENT GOALS return to PLOF, have full ROM and strength, to be able to perform car work and yard work, pick up granddaughter      OBJECTIVE:   DIAGNOSTIC FINDINGS:  Pt had x rays and a MRI prior to surgery.     TODAY'S TREATMENT:  Therapeutic Exercise: -Reviewed pt presentation, pain level, HEP compliance, and response to prior Rx. -Assessed shoulder ROM. Shoulder ROM: Flexion:  AAROM:  142 deg ER PROM/AAROM:  40/40 deg  -Pt performed:      Scap retractions x 10  reps    Supine shoulder flexion wand AAROM 2x10    Seated table slides in flexion 2x10 reps    Supine wand ER w/n protocol range approx 15 reps    Seated Pulleys in flexion and scaption    See below for pt education.  Manual Therapy: Pt received gentle distraction with oscillation and Grade II posterior and inferior jt mobs to R GH joint in supine to improve pain, ROM, and to normalize arthrokinematics f/b R shoulder PROM in flexion, abduction, ER, and IR per protocol ranges.     PATIENT EDUCATION: Education details:  PT answered Pt's questions.  protocol restrictions, progression of protocok, POC, HEP, dx, and exercise form.  Person educated: Patient Education method: Explanation, Demonstration, Tactile cues, and Verbal cues Education comprehension: verbalized understanding, returned demonstration, verbal cues required, tactile cues required, and needs further education   HOME EXERCISE PROGRAM: Access Code: [email or web address] URL: [email or web address] Date: 07/17/2021 Prepared by: Ronny Flurry  Exercises - Wrist AROM Flexion Extension  - 3 x daily - 7 x weekly - 2-3 sets - 10 reps - Seated Gripping Towel  - 2-3 x daily - 7 x weekly - 2 sets - 10 reps Educated pt he could perform putty gripping at home instead of towel gripping  ASSESSMENT:  CLINICAL IMPRESSION: Pt is making great progress with shoulder ROM and protocol.  Pt has weaned out of sling without adverse effects.   Pt has reduced tightness with PROM and demonstrates good AAROM and PROM per protocol.  He achieved ER protocol limits at this time.    Pt performed AAROM well with cuing for correct form without c/o's.  He responded well to Rx having no pain during and after Rx.  He should benefit from continued skilled PT services per protocol to improve ROM and to restore desired level of function.            OBJECTIVE IMPAIRMENTS decreased activity tolerance, decreased endurance, decreased ROM, decreased strength,  hypomobility, impaired flexibility, and pain.   ACTIVITY LIMITATIONS carrying, lifting,  bathing, dressing, reach over head, hygiene/grooming, and caring for others  PARTICIPATION LIMITATIONS: meal prep, cleaning, community activity, and yard work   [employer] POTENTIAL: Good  CLINICAL DECISION MAKING: Stable/uncomplicated  EVALUATION COMPLEXITY: Low   GOALS:  SHORT TERM GOALS:   Pt will be independent and compliant with HEP for improved ROM, pain, and function.  Baseline: Goal status: INITIAL Target date: 08/27/2021   2.  Pt will tolerate R shoulder PROM per protocol without significant pain for improved mobility and stiffness Baseline:  Goal status: INITIAL Target date: 08/06/2021   3.  Pt will demo R shoulder PROM to be 140 deg in flexion and 30 deg in ER for improved mobility and stiffness.  Baseline:  Goal status: INITIAL Target date: 08/27/2021   4.  Pt will wean out of sling per MD allowance without adverse effects.  Baseline:  Goal status: INITIAL Target date: 08/27/2021    5.  Pt will tolerate the initiation of shoulder AAROM without adverse effects.  Baseline:  Goal status: INITIAL Target date: 08/27/2021   6.  Pt will demo supine shoulder AAROM to be 120 deg in flexion and in 40 deg in ER for progression of AAROM and protocol Baseline:  Goal status: INITIAL Target date:  09/10/2021  7.  Pt will demo R shoulder flexion and scaption AROM to at least 100 deg in standing without significant shoulder hike for improved UE elevation. Baseline:  Goal status: INITIAL Target date:  10/01/2021  8.  Pt will be able to perform his self care activities with no > than minimal difficulty.   Baseline:  Goal status: INITIAL Target date:  10/08/2021   LONG TERM GOALS: Target date: 11/05/2021   (Remove Blue Hyperlink)  Pt will demo R shoulder AROM to be [facility] t/o for performance of ADLs and IADLs.  Baseline:  Goal status: INITIAL  2.  Pt will be able to perform his ADLs and  IADLs without significant difficulty and pain. Baseline:  Goal status: INITIAL  3.   Pt will be able to perform his normal reaching and overhead activities without significant pain and limitation. Baseline:  Goal status: INITIAL  4.  Pt will demo at least 4/5 MMT strength t/o R shoulder for improved tolerance with and performance of daily activities, functional lifting, and performance of yard work.  Baseline:  Goal status: INITIAL  5.  Pt will be able to perform appropriate functional carrying and lifting without significant pain in order to perform IADLs and household chores. Baseline:  Goal status: INITIAL  6.  Pt will be independent with advanced HEP for improved shoulder strength and stability to assist with returning to desired level of function and performing car work and yard work. Baseline:  Goal status: INITIAL   PLAN: PT FREQUENCY:  1x/wk x 4 weeks and 2x/wk x 12 weeks  PT DURATION: other: 16 weeks  PLANNED INTERVENTIONS: Therapeutic exercises, Therapeutic activity, Neuromuscular re-education, Patient/Family education, Joint mobilization, Aquatic Therapy, Dry Needling, Electrical stimulation, Spinal mobilization, Cryotherapy, Moist heat, Taping, Ultrasound, Manual therapy, and Re-evaluation  PLAN FOR NEXT SESSION: Cont per Dr. Eddie Dibbles Rotator Cuff Repair protocol with subscapularis restrictions.  Will use subscapularis repair protocol also.  Also has biceps tenodesis restrictions.  Progress slowly, Pt had supraspinatus, infraspinatus, and subscapularis repairs.        Selinda Michaels III PT, DPT 08/28/21 9:49 AM

## 2021-08-27 ENCOUNTER — Ambulatory Visit (HOSPITAL_BASED_OUTPATIENT_CLINIC_OR_DEPARTMENT_OTHER): Payer: Medicare PPO | Admitting: Physical Therapy

## 2021-08-27 ENCOUNTER — Encounter (HOSPITAL_BASED_OUTPATIENT_CLINIC_OR_DEPARTMENT_OTHER): Payer: Self-pay | Admitting: Physical Therapy

## 2021-08-27 ENCOUNTER — Ambulatory Visit (INDEPENDENT_AMBULATORY_CARE_PROVIDER_SITE_OTHER): Payer: Medicare PPO | Admitting: Orthopaedic Surgery

## 2021-08-27 DIAGNOSIS — M25611 Stiffness of right shoulder, not elsewhere classified: Secondary | ICD-10-CM

## 2021-08-27 DIAGNOSIS — M25511 Pain in right shoulder: Secondary | ICD-10-CM

## 2021-08-27 DIAGNOSIS — S46011A Strain of muscle(s) and tendon(s) of the rotator cuff of right shoulder, initial encounter: Secondary | ICD-10-CM

## 2021-08-27 DIAGNOSIS — M6281 Muscle weakness (generalized): Secondary | ICD-10-CM

## 2021-08-27 NOTE — Progress Notes (Signed)
Post Operative Evaluation    Procedure/Date of Surgery: Shoulder rotator cuff repair and subscapularis repair 07/13/2021  Interval History:   Presents today 6 weeks status post the above procedure.  Overall he does continue to improve.  Today passively he is able to active forward elevate to 150 degrees overhead quite easily.  He has not worked on any active or active assisted range of motion with physical therapy at this time.  PMH/PSH/Family History/Social History/Meds/Allergies:    Past Medical History:  Diagnosis Date   Arthritis    Asthma    x 1 age 55   Hyperlipidemia    Right rotator cuff tear    Past Surgical History:  Procedure Laterality Date   COLONOSCOPY     JOINT REPLACEMENT     SHOULDER ARTHROSCOPY WITH ROTATOR CUFF REPAIR AND OPEN BICEPS TENODESIS Right 07/13/2021   Procedure: RIGHT SHOULDER ARTHROSCOPY WITH ROTATOR CUFF REPAIR AND BICEPS TENODESIS;  Surgeon: Vanetta Mulders, MD;  Location: [facility];  Service: Orthopedics;  Laterality: Right;   TOTAL HIP ARTHROPLASTY     bilatera   Social History   Socioeconomic History   Marital status: Married    Spouse name: Not on file   Number of children: 2   Years of education: Not on file   Highest education level: Not on file  Occupational History   Not on file  Tobacco Use   Smoking status: Former    Packs/day: 0.50    Years: 40.00    Total pack years: 20.00    Types: Cigarettes    Quit date: 2010    Years since quitting: 13.6   Smokeless tobacco: Never  Vaping Use   Vaping Use: Never used  Substance and Sexual Activity   Alcohol use: Yes    Alcohol/week: 5.0 - 10.0 standard drinks of alcohol    Types: 5 - 10 Cans of beer per week    Comment: Beer   Drug use: Never   Sexual activity: Yes  Other Topics Concern   Not on file  Social History Narrative   [employer]   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[employer]   Social Determinants of Health   Financial Resource Strain: Not on file   Food Insecurity: Not on file  Transportation Needs: Not on file  Physical Activity: Not on file  Stress: Not on file  Social Connections: Not on file   Family History  Problem Relation Age of Onset   Heart disease Father    Cancer Mother    Lung cancer Sister        smoker   Colon cancer Neg Hx    Prostate cancer Neg Hx    Esophageal cancer Neg Hx    Rectal cancer Neg Hx    No Known Allergies Current Outpatient Medications  Medication Sig Dispense Refill   acetaminophen (TYLENOL) 500 MG tablet Take 500 mg by mouth every 6 (six) hours as needed.     AMBULATORY NON FORMULARY MEDICATION Lift chair.  Dispense 1.  R26.81. 1 each 0   aspirin EC 325 MG tablet Take 1 tablet (325 mg total) by mouth daily. 30 tablet 0   atorvastatin (LIPITOR) 40 MG tablet TAKE 1 TABLET BY MOUTH DAILY 90 tablet 1   atorvastatin (LIPITOR) 40 MG tablet TAKE 1 TABLET(40 MG) BY MOUTH DAILY 90 tablet 1  diclofenac Sodium (VOLTAREN) 1 % GEL Apply 1 Application topically 4 (four) times daily as needed (knee pain).     docusate sodium (COLACE) 100 MG capsule Take 200 mg by mouth daily.     ibuprofen (ADVIL) 200 MG tablet Take 600 mg by mouth every 8 (eight) hours as needed for moderate pain.     methocarbamol (ROBAXIN) 500 MG tablet Take 2 tablets by mouth every 8 hours as needed for 30 days. (Patient not taking: Reported on 07/10/2021) 180 tablet 0   oxyCODONE (OXY IR/ROXICODONE) 5 MG immediate release tablet Take 1 tablet (5 mg total) by mouth every 4 (four) hours as needed (severe pain). (Patient not taking: Reported on 07/10/2021) 20 tablet 0   No current facility-administered medications for this visit.   No results found.  Review of Systems:   A ROS was performed including pertinent positives and negatives as documented in the HPI.   Musculoskeletal Exam:    Right shoulder portals are well-healed.  In the supine position he is able to actively forward elevate with me to 150 degrees.  External rotation at  the side is to 60 degrees.  Internal rotation is to back pocket.  2+ radial pulse.  Sensation is intact in all distributions of the right arm.   Imaging:    None  I personally reviewed and interpreted the radiographs.   Assessment:   66 year old male who is 6 weeks status post supraspinatus tendon repair as well as subscapularis tendon repair with biceps tenodesis.  At this time he may begin active and active assisted range of motion.  He will work on overhead activity with physical therapy.  I will plan to see him back in 6 weeks for reassessment  Plan :    -Return to clinic in 6 weeks for reassessment      I personally saw and evaluated the patient, and participated in the management and treatment plan.  Vanetta Mulders, MD Attending Physician, Orthopedic Surgery  This document was dictated using Dragon voice recognition software. A reasonable attempt at proof reading has been made to minimize errors.

## 2021-08-30 NOTE — Therapy (Signed)
OUTPATIENT PHYSICAL THERAPY SHOULDER TREATMENT   Patient Name: Chad Manning MRN: [id number] DOB:10-01-1955, 66 y.o., male Today's Date: 08/31/2021     PT End of Session - 08/31/21 1055     Visit Number 8    Number of Visits 28    Date for PT Re-Evaluation 10/09/21    Authorization Type [employer]    PT Start Time 3976    PT Stop Time 7341    PT Time Calculation (min) 36 min    Activity Tolerance Patient tolerated treatment well;No increased pain    Behavior During Therapy WFL for tasks assessed/performed                    Past Medical History:  Diagnosis Date   Arthritis    Asthma    x 1 age 66   Hyperlipidemia    Right rotator cuff tear    Past Surgical History:  Procedure Laterality Date   COLONOSCOPY     JOINT REPLACEMENT     SHOULDER ARTHROSCOPY WITH ROTATOR CUFF REPAIR AND OPEN BICEPS TENODESIS Right 07/13/2021   Procedure: RIGHT SHOULDER ARTHROSCOPY WITH ROTATOR CUFF REPAIR AND BICEPS TENODESIS;  Surgeon: Vanetta Mulders, MD;  Location: [facility];  Service: Orthopedics;  Laterality: Right;   TOTAL HIP ARTHROPLASTY     bilatera   Patient Active Problem List   Diagnosis Date Noted   Traumatic complete tear of right rotator cuff    Former smoker 12/14/2019   Acquired hallux rigidus of right foot 11/29/2018   Dyslipidemia 03/15/2018   Hyperglycemia 03/15/2018     REFERRING PROVIDER: Vanetta Mulders, MD   REFERRING DIAG: S46.011A (ICD-10-CM) - Traumatic complete tear of right rotator cuff, initial encounter   THERAPY DIAG:  Right shoulder pain, unspecified chronicity  Stiffness of right shoulder, not elsewhere classified  Muscle weakness (generalized)  Rationale for Evaluation and Treatment Rehabilitation  ONSET DATE: Injury 06/05/2021 ; DOS 07/13/2021  SUBJECTIVE:                                                                                                                                                                             SUBJECTIVE  STATEMENT: Pt is 7 weeks s/p supraspinatus, infraspinatus, and subscapularis repairs, biceps tenodesis, and subacromial decompression.   Pt denies any adverse effects after prior Rx.  Pt reports no pain currently.  "I can tell it is there, it may be a little tight".  He reports no increased pain since being out of sling.  Pt reports compliance with HEP and has no adverse effects with HEP.     PERTINENT HISTORY: R shoulder arthroscopic rotator cuff repair of supraspinatus, infraspinatus, and subscapularis, biceps tenodesis, and subacromial decompression on 07/13/2021.  MD message indicated subscapularis restrictions also.  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 None at rest NPRS:  Current and best:  0/10 Location:  R shoulder  PRECAUTIONS: Other: RCR of supraspinatus, infraspinatus, and subscapularis.  Hx of bilat THA  WEIGHT BEARING RESTRICTIONS Yes NWB R UE    FALLS:  Has patient fallen in last 6 months? Yes. Number of falls 1, this injury  LIVING ENVIRONMENT: Lives with: lives with their spouse Lives in: 1 story home Stairs: 2 steps without rail to enter home   OCCUPATION: Pt is retired  PLOF: Independent; Pt was able to perform all of his ADLs/IADLs and reaching and overhead activities independently without limitation.  Pt was able to perform yard work and work on car without limitation.  Pt was able to pick up granddaughter.   PATIENT GOALS return to PLOF, have full ROM and strength, to be able to perform car work and yard work, pick up granddaughter      OBJECTIVE:   DIAGNOSTIC FINDINGS:  Pt had x rays and a MRI prior to surgery.     TODAY'S TREATMENT:  Therapeutic Exercise: -Reviewed pt presentation, pain level, HEP compliance, and response to prior Rx. -Assessed shoulder ROM. Shoulder ROM: Flexion:  AAROM:  148 deg   -Pt performed:      Scap retractions x 10 reps    Supine shoulder flexion wand AAROM 2x10    Supine wand  ER w/n protocol range 2x10 reps    Seated Pulleys in flexion and scaption 2x10 reps each    standing wand flexion x 10 reps in front of mirror    Standing wall walks in flexion AAROM approx 7-8 reps    See below for pt education.  Manual Therapy: Pt received gentle distraction with oscillation and Grade II posterior and inferior jt mobs to R GH joint in supine to improve pain, ROM, and to normalize arthrokinematics f/b R shoulder PROM in flexion, abduction, ER, and IR per protocol ranges.     PATIENT EDUCATION: Education details:  PT answered Pt's questions.  protocol restrictions, progression of protocol, POC, HEP, and exercise form.  Person educated: Patient Education method: Explanation, Demonstration, Tactile cues, and Verbal cues Education comprehension: verbalized understanding, returned demonstration, verbal cues required, tactile cues required, and needs further education   HOME EXERCISE PROGRAM: Access Code: [email or web address] URL: [email or web address] Date: 07/17/2021 Prepared by: Ronny Flurry   ASSESSMENT:  CLINICAL IMPRESSION: Pt is making great progress with shoulder PROM and AAROM and protocol.  Pt has weaned out of sling without adverse effects.  Pt performed AAROM well with cuing for correct form without c/o's.  PT had pt begin standing Shoulder AAROM.  Pt has greater ROM and better form with standing walks in flexion than standing wand flexion.  Pt has a compensatory shoulder hike which is worse with wand flexion than shoulder flexion wall walks AAROM.  He responded well to Rx having no pain during and after Rx.  He should benefit from continued skilled PT services per protocol to improve ROM and to restore desired level of function.          OBJECTIVE IMPAIRMENTS decreased activity tolerance, decreased endurance, decreased ROM, decreased strength, hypomobility, impaired flexibility, and pain.   ACTIVITY LIMITATIONS carrying, lifting, bathing, dressing, reach  over head, hygiene/grooming, and caring for others  PARTICIPATION LIMITATIONS: meal prep, cleaning, community activity, and yard work   [employer] POTENTIAL: [place]  MAKING: Stable/uncomplicated  EVALUATION COMPLEXITY: Low   GOALS:  SHORT TERM GOALS:   Pt will be independent and compliant with HEP for improved ROM, pain, and function.  Baseline: Goal status: INITIAL Target date: 08/27/2021   2.  Pt will tolerate R shoulder PROM per protocol without significant pain for improved mobility and stiffness Baseline:  Goal status: INITIAL Target date: 08/06/2021   3.  Pt will demo R shoulder PROM to be 140 deg in flexion and 30 deg in ER for improved mobility and stiffness.  Baseline:  Goal status: INITIAL Target date: 08/27/2021   4.  Pt will wean out of sling per MD allowance without adverse effects.  Baseline:  Goal status: INITIAL Target date: 08/27/2021    5.  Pt will tolerate the initiation of shoulder AAROM without adverse effects.  Baseline:  Goal status: INITIAL Target date: 08/27/2021   6.  Pt will demo supine shoulder AAROM to be 120 deg in flexion and in 40 deg in ER for progression of AAROM and protocol Baseline:  Goal status: INITIAL Target date:  09/10/2021  7.  Pt will demo R shoulder flexion and scaption AROM to at least 100 deg in standing without significant shoulder hike for improved UE elevation. Baseline:  Goal status: INITIAL Target date:  10/01/2021  8.  Pt will be able to perform his self care activities with no > than minimal difficulty.   Baseline:  Goal status: INITIAL Target date:  10/08/2021   LONG TERM GOALS: Target date: 11/05/2021   (Remove Blue Hyperlink)  Pt will demo R shoulder AROM to be [facility] t/o for performance of ADLs and IADLs.  Baseline:  Goal status: INITIAL  2.  Pt will be able to perform his ADLs and IADLs without significant difficulty and pain. Baseline:  Goal status: INITIAL  3.   Pt will be able to  perform his normal reaching and overhead activities without significant pain and limitation. Baseline:  Goal status: INITIAL  4.  Pt will demo at least 4/5 MMT strength t/o R shoulder for improved tolerance with and performance of daily activities, functional lifting, and performance of yard work.  Baseline:  Goal status: INITIAL  5.  Pt will be able to perform appropriate functional carrying and lifting without significant pain in order to perform IADLs and household chores. Baseline:  Goal status: INITIAL  6.  Pt will be independent with advanced HEP for improved shoulder strength and stability to assist with returning to desired level of function and performing car work and yard work. Baseline:  Goal status: INITIAL   PLAN: PT FREQUENCY:  1x/wk x 4 weeks and 2x/wk x 12 weeks  PT DURATION: other: 16 weeks  PLANNED INTERVENTIONS: Therapeutic exercises, Therapeutic activity, Neuromuscular re-education, Patient/Family education, Joint mobilization, Aquatic Therapy, Dry Needling, Electrical stimulation, Spinal mobilization, Cryotherapy, Moist heat, Taping, Ultrasound, Manual therapy, and Re-evaluation  PLAN FOR NEXT SESSION: Cont per Dr. Eddie Dibbles Rotator Cuff Repair protocol with subscapularis restrictions.  Will use subscapularis repair protocol also.  Also has biceps tenodesis restrictions.  Progress slowly, Pt had supraspinatus, infraspinatus, and subscapularis repairs.  Add seated wand ER w/n protocol range next visit.        Selinda Michaels III PT, DPT 08/31/21 2:58 PM

## 2021-08-31 ENCOUNTER — Encounter (HOSPITAL_BASED_OUTPATIENT_CLINIC_OR_DEPARTMENT_OTHER): Payer: Self-pay | Admitting: Physical Therapy

## 2021-08-31 ENCOUNTER — Ambulatory Visit (HOSPITAL_BASED_OUTPATIENT_CLINIC_OR_DEPARTMENT_OTHER): Payer: Medicare PPO | Admitting: Physical Therapy

## 2021-08-31 DIAGNOSIS — M25511 Pain in right shoulder: Secondary | ICD-10-CM | POA: Diagnosis not present

## 2021-08-31 DIAGNOSIS — M6281 Muscle weakness (generalized): Secondary | ICD-10-CM

## 2021-08-31 DIAGNOSIS — M25611 Stiffness of right shoulder, not elsewhere classified: Secondary | ICD-10-CM

## 2021-09-01 NOTE — Therapy (Signed)
OUTPATIENT PHYSICAL THERAPY SHOULDER TREATMENT   Patient Name: Chad Manning MRN: [id number] DOB:1955/10/29, 66 y.o., male Today's Date: 09/01/2021              Past Medical History:  Diagnosis Date   Arthritis    Asthma    x 1 age 82   Hyperlipidemia    Right rotator cuff tear    Past Surgical History:  Procedure Laterality Date   COLONOSCOPY     JOINT REPLACEMENT     SHOULDER ARTHROSCOPY WITH ROTATOR CUFF REPAIR AND OPEN BICEPS TENODESIS Right 07/13/2021   Procedure: RIGHT SHOULDER ARTHROSCOPY WITH ROTATOR CUFF REPAIR AND BICEPS TENODESIS;  Surgeon: Vanetta Mulders, MD;  Location: [facility];  Service: Orthopedics;  Laterality: Right;   TOTAL HIP ARTHROPLASTY     bilatera   Patient Active Problem List   Diagnosis Date Noted   Traumatic complete tear of right rotator cuff    Former smoker 12/14/2019   Acquired hallux rigidus of right foot 11/29/2018   Dyslipidemia 03/15/2018   Hyperglycemia 03/15/2018     REFERRING PROVIDER: Vanetta Mulders, MD   REFERRING DIAG: S46.011A (ICD-10-CM) - Traumatic complete tear of right rotator cuff, initial encounter   THERAPY DIAG:  No diagnosis found.  Rationale for Evaluation and Treatment Rehabilitation  ONSET DATE: Injury 06/05/2021 ; DOS 07/13/2021  SUBJECTIVE:                                                                                                                                                                             SUBJECTIVE STATEMENT: Pt is 7 weeks and 2 days s/p supraspinatus, infraspinatus, and subscapularis repairs, biceps tenodesis, and subacromial decompression.   Pt denies any adverse effects after prior Rx.  Pt reports no pain currently.  "I can tell it is there, it may be a little tight".  He reports no increased pain since being out of sling.  Pt reports compliance with HEP and has no adverse effects with HEP.     PERTINENT HISTORY: R shoulder arthroscopic rotator cuff repair of supraspinatus,  infraspinatus, and subscapularis, biceps tenodesis, and subacromial decompression on 07/13/2021.  MD message indicated subscapularis restrictions also.  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 None at rest NPRS:  Current and best:  0/10 Location:  R shoulder  PRECAUTIONS: Other: RCR of supraspinatus, infraspinatus, and subscapularis.  Hx of bilat THA  WEIGHT BEARING RESTRICTIONS Yes NWB R UE    FALLS:  Has patient fallen in last 6 months? Yes. Number of falls 1, this injury  LIVING ENVIRONMENT: Lives with: lives with their spouse Lives in: 1 story home Stairs: 2 steps without rail to  enter home   OCCUPATION: Pt is retired  PLOF: Independent; Pt was able to perform all of his ADLs/IADLs and reaching and overhead activities independently without limitation.  Pt was able to perform yard work and work on car without limitation.  Pt was able to pick up granddaughter.   PATIENT GOALS return to PLOF, have full ROM and strength, to be able to perform car work and yard work, pick up granddaughter      OBJECTIVE:   DIAGNOSTIC FINDINGS:  Pt had x rays and a MRI prior to surgery.     TODAY'S TREATMENT:  Therapeutic Exercise: -Reviewed pt presentation, pain level, HEP compliance, and response to prior Rx. -Assessed shoulder ROM. Shoulder ROM: Flexion:  AAROM:  148 deg   -Pt performed:      Scap retractions x 10 reps    Supine shoulder flexion wand AAROM 2x10    Supine wand ER w/n protocol range 2x10 reps    Seated Pulleys in flexion and scaption 2x10 reps each    standing wand flexion x 10 reps in front of mirror    Standing wall walks in flexion AAROM approx 7-8 reps    See below for pt education.  Manual Therapy: Pt received gentle distraction with oscillation and Grade II posterior and inferior jt mobs to R GH joint in supine to improve pain, ROM, and to normalize arthrokinematics f/b R shoulder PROM in flexion, abduction, ER, and IR  per protocol ranges.     PATIENT EDUCATION: Education details:  PT answered Pt's questions.  protocol restrictions, progression of protocol, POC, HEP, and exercise form.  Person educated: Patient Education method: Explanation, Demonstration, Tactile cues, and Verbal cues Education comprehension: verbalized understanding, returned demonstration, verbal cues required, tactile cues required, and needs further education   HOME EXERCISE PROGRAM: Access Code: [email or web address] URL: [email or web address] Date: 07/17/2021 Prepared by: Ronny Flurry   ASSESSMENT:  CLINICAL IMPRESSION: Pt is making great progress with shoulder PROM and AAROM and protocol.  Pt has weaned out of sling without adverse effects.  Pt performed AAROM well with cuing for correct form without c/o's.  PT had pt begin standing Shoulder AAROM.  Pt has greater ROM and better form with standing walks in flexion than standing wand flexion.  Pt has a compensatory shoulder hike which is worse with wand flexion than shoulder flexion wall walks AAROM.  He responded well to Rx having no pain during and after Rx.  He should benefit from continued skilled PT services per protocol to improve ROM and to restore desired level of function.          OBJECTIVE IMPAIRMENTS decreased activity tolerance, decreased endurance, decreased ROM, decreased strength, hypomobility, impaired flexibility, and pain.   ACTIVITY LIMITATIONS carrying, lifting, bathing, dressing, reach over head, hygiene/grooming, and caring for others  PARTICIPATION LIMITATIONS: meal prep, cleaning, community activity, and yard work   [employer] POTENTIAL: Good  CLINICAL DECISION MAKING: Stable/uncomplicated  EVALUATION COMPLEXITY: Low   GOALS:  SHORT TERM GOALS:   Pt will be independent and compliant with HEP for improved ROM, pain, and function.  Baseline: Goal status: INITIAL Target date: 08/27/2021   2.  Pt will tolerate R shoulder PROM per protocol  without significant pain for improved mobility and stiffness Baseline:  Goal status: INITIAL Target date: 08/06/2021   3.  Pt will demo R shoulder PROM to be 140 deg in flexion and 30 deg in ER for improved mobility and stiffness.  Baseline:  Goal status: INITIAL Target date: 08/27/2021  4.  Pt will wean out of sling per MD allowance without adverse effects.  Baseline:  Goal status: INITIAL Target date: 08/27/2021    5.  Pt will tolerate the initiation of shoulder AAROM without adverse effects.  Baseline:  Goal status: INITIAL Target date: 08/27/2021   6.  Pt will demo supine shoulder AAROM to be 120 deg in flexion and in 40 deg in ER for progression of AAROM and protocol Baseline:  Goal status: INITIAL Target date:  09/10/2021  7.  Pt will demo R shoulder flexion and scaption AROM to at least 100 deg in standing without significant shoulder hike for improved UE elevation. Baseline:  Goal status: INITIAL Target date:  10/01/2021  8.  Pt will be able to perform his self care activities with no > than minimal difficulty.   Baseline:  Goal status: INITIAL Target date:  10/08/2021   LONG TERM GOALS: Target date: 11/05/2021   (Remove Blue Hyperlink)  Pt will demo R shoulder AROM to be [facility] t/o for performance of ADLs and IADLs.  Baseline:  Goal status: INITIAL  2.  Pt will be able to perform his ADLs and IADLs without significant difficulty and pain. Baseline:  Goal status: INITIAL  3.   Pt will be able to perform his normal reaching and overhead activities without significant pain and limitation. Baseline:  Goal status: INITIAL  4.  Pt will demo at least 4/5 MMT strength t/o R shoulder for improved tolerance with and performance of daily activities, functional lifting, and performance of yard work.  Baseline:  Goal status: INITIAL  5.  Pt will be able to perform appropriate functional carrying and lifting without significant pain in order to perform IADLs and household  chores. Baseline:  Goal status: INITIAL  6.  Pt will be independent with advanced HEP for improved shoulder strength and stability to assist with returning to desired level of function and performing car work and yard work. Baseline:  Goal status: INITIAL   PLAN: PT FREQUENCY:  1x/wk x 4 weeks and 2x/wk x 12 weeks  PT DURATION: other: 16 weeks  PLANNED INTERVENTIONS: Therapeutic exercises, Therapeutic activity, Neuromuscular re-education, Patient/Family education, Joint mobilization, Aquatic Therapy, Dry Needling, Electrical stimulation, Spinal mobilization, Cryotherapy, Moist heat, Taping, Ultrasound, Manual therapy, and Re-evaluation  PLAN FOR NEXT SESSION: Cont per Dr. Eddie Dibbles Rotator Cuff Repair protocol with subscapularis restrictions.  Will use subscapularis repair protocol also.  Also has biceps tenodesis restrictions.  Progress slowly, Pt had supraspinatus, infraspinatus, and subscapularis repairs.  Add seated wand ER w/n protocol range next visit.        Selinda Michaels III PT, DPT 09/01/21 9:58 PM

## 2021-09-02 ENCOUNTER — Ambulatory Visit (HOSPITAL_BASED_OUTPATIENT_CLINIC_OR_DEPARTMENT_OTHER): Payer: Medicare PPO | Admitting: Physical Therapy

## 2021-09-02 ENCOUNTER — Encounter (HOSPITAL_BASED_OUTPATIENT_CLINIC_OR_DEPARTMENT_OTHER): Payer: Self-pay | Admitting: Physical Therapy

## 2021-09-02 DIAGNOSIS — M25511 Pain in right shoulder: Secondary | ICD-10-CM | POA: Diagnosis not present

## 2021-09-02 DIAGNOSIS — M25611 Stiffness of right shoulder, not elsewhere classified: Secondary | ICD-10-CM

## 2021-09-02 DIAGNOSIS — M6281 Muscle weakness (generalized): Secondary | ICD-10-CM

## 2021-09-07 NOTE — Therapy (Signed)
OUTPATIENT PHYSICAL THERAPY SHOULDER TREATMENT NOTE  / PROGRESS NOTE  Progress Note Reporting Period 07/16/2021 to 09/08/2021  See note below for Objective Data and Assessment of Progress/Goals.       Patient Name: Chad Manning MRN: [id number] DOB:August 16, 1955, 66 y.o., male Today's Date: 09/09/2021     PT End of Session - 09/08/21 1123     Visit Number 10    Number of Visits 28    Date for PT Re-Evaluation 11/10/21    Authorization Type [employer]    PT Start Time 1025    PT Stop Time 1110    PT Time Calculation (min) 45 min    Activity Tolerance Patient tolerated treatment well;No increased pain    Behavior During Therapy WFL for tasks assessed/performed                   Past Medical History:  Diagnosis Date   Arthritis    Asthma    x 1 age 68   Hyperlipidemia    Right rotator cuff tear    Past Surgical History:  Procedure Laterality Date   COLONOSCOPY     JOINT REPLACEMENT     SHOULDER ARTHROSCOPY WITH ROTATOR CUFF REPAIR AND OPEN BICEPS TENODESIS Right 07/13/2021   Procedure: RIGHT SHOULDER ARTHROSCOPY WITH ROTATOR CUFF REPAIR AND BICEPS TENODESIS;  Surgeon: Vanetta Mulders, MD;  Location: [facility];  Service: Orthopedics;  Laterality: Right;   TOTAL HIP ARTHROPLASTY     bilatera   Patient Active Problem List   Diagnosis Date Noted   Traumatic complete tear of right rotator cuff    Former smoker 12/14/2019   Acquired hallux rigidus of right foot 11/29/2018   Dyslipidemia 03/15/2018   Hyperglycemia 03/15/2018     REFERRING PROVIDER: Vanetta Mulders, MD   REFERRING DIAG: S46.011A (ICD-10-CM) - Traumatic complete tear of right rotator cuff, initial encounter   THERAPY DIAG:  Right shoulder pain, unspecified chronicity  Stiffness of right shoulder, not elsewhere classified  Muscle weakness (generalized)  Rationale for Evaluation and Treatment Rehabilitation  ONSET DATE: Injury 06/05/2021 ; DOS 07/13/2021  SUBJECTIVE:                                                                                                                                                                              SUBJECTIVE STATEMENT: Pt is 8 weeks and 1 day s/p supraspinatus, infraspinatus, and subscapularis repairs, biceps tenodesis, and subacromial decompression.   Pt denies any adverse effects after prior Rx.  Pt reports no pain currently.  Pt reports compliance with HEP and has no adverse effects with HEP.  Pt is frustrated not being able to lift R UE.     FUNCTIONAL IMPROVEMENTS:  dressing,  bathing  Able to put socks on.  Able to use it more with daily activities.  Washing hair, able to get wallet out of back pocket FUNCTIONAL LIMITATIONS:  reaching behind head, washing hair, reaching, lifting, reaching the shampoo bottle, overhead activities.  Unable to perform yard and car work.  Limited per protocol.     PERTINENT HISTORY: R shoulder arthroscopic rotator cuff repair of supraspinatus, infraspinatus, and subscapularis, biceps tenodesis, and subacromial decompression on 07/13/2021.  MD message indicated subscapularis restrictions also.  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 None at rest NPRS:  Current, worst, and best:  0/10 Location:  R shoulder  PRECAUTIONS: Other: RCR of supraspinatus, infraspinatus, and subscapularis.  Hx of bilat THA  WEIGHT BEARING RESTRICTIONS Yes NWB R UE    FALLS:  Has patient fallen in last 6 months? Yes. Number of falls 1, this injury  LIVING ENVIRONMENT: Lives with: lives with their spouse Lives in: 1 story home Stairs: 2 steps without rail to enter home   OCCUPATION: Pt is retired  PLOF: Independent; Pt was able to perform all of his ADLs/IADLs and reaching and overhead activities independently without limitation.  Pt was able to perform yard work and work on car without limitation.  Pt was able to pick up granddaughter.   PATIENT GOALS return to PLOF, have full ROM and strength, to  be able to perform car work and yard work, pick up granddaughter      OBJECTIVE:   DIAGNOSTIC FINDINGS:  Pt had x rays and a MRI prior to surgery.     TODAY'S TREATMENT:  PATIENT SURVEYS:  UEFI:  52/80  R shoulder PROM: Flexion: 158 deg Abd:  120 deg ER:  73 deg in scapular plane IR:  50 deg  R shoulder AAROM: Flexion:  Supine: 152 deg.  Standing: 148 deg with shoulder hike ER: 65 deg  R shoulder AROM: Flexion: Supine:  142  Therapeutic Exercise: -Reviewed pt presentation, pain level, HEP compliance, and response to prior Rx.  -Pt performed:      Supine wand ER w/n protocol range x10 reps    Seated Pulleys in flexion  x20 reps and scaption x30 reps each    Standing wall walks in flexion AAROM approx x10 reps    Supine flexion AROM 2x10 reps    Supine serratus punches A/AAROM 2x10 reps    See below for pt education.        Manual Therapy: Pt received gentle distraction with oscillation and Grade II posterior and inferior jt mobs to R GH joint in supine to improve pain, ROM, and to normalize arthrokinematics f/b R shoulder PROM in flexion, abduction, ER, and IR per protocol ranges.     PATIENT EDUCATION: Education details:  PT answered Pt's questions.  protocol restrictions, progression of protocol, POC, HEP, and exercise form.  Person educated: Patient Education method: Explanation, Demonstration, Tactile cues, and Verbal cues Education comprehension: verbalized understanding, returned demonstration, verbal cues required, tactile cues required, and needs further education   HOME EXERCISE PROGRAM: Access Code: [email or web address] URL: [email or web address] Date: 07/17/2021 Prepared by: Ronny Flurry   ASSESSMENT:  CLINICAL IMPRESSION: Pt has made great progress with R shoulder PROM and AAROM.  He is frustrated due to inability to lift R UE actively against gravity.  PT explained to pt why he is unable to at this time and it is expected due to surgery  and protocol.  Pt is able  to perform supine flexion AROM well with gravity minimized.  Pt is progressing appropriately per protocol.  He does have shoulder hike with standing flexion AAROM.  Pt has improved with functional activities including dressing, bathing, and washing hair.  Pt has expected limitations including reaching, ADLs/IADLs, and being unable to perform overhead activities.  He is limited per protocol.  He demonstrates clinically significant improvement in self perceived disability with UEFI improving from 0/80 initially to 52/80 currently.  Pt has met STG's #1-6.  He should benefit from continued skilled PT services per protocol to improve ROM and to restore desired level of function.       OBJECTIVE IMPAIRMENTS decreased activity tolerance, decreased endurance, decreased ROM, decreased strength, hypomobility, impaired flexibility, and pain.   ACTIVITY LIMITATIONS carrying, lifting, bathing, dressing, reach over head, hygiene/grooming, and caring for others  PARTICIPATION LIMITATIONS: meal prep, cleaning, community activity, and yard work   [employer] POTENTIAL: Good  CLINICAL DECISION MAKING: Stable/uncomplicated  EVALUATION COMPLEXITY: Low   GOALS:  SHORT TERM GOALS:   Pt will be independent and compliant with HEP for improved ROM, pain, and function.  Baseline: Goal status: GOAL MET Target date: 08/27/2021   2.  Pt will tolerate R shoulder PROM per protocol without significant pain for improved mobility and stiffness Baseline:  Goal status: GOAL MET  Target date: 08/06/2021   3.  Pt will demo R shoulder PROM to be 140 deg in flexion and 30 deg in ER for improved mobility and stiffness.  Baseline:  Goal status: GOAL MET Target date: 08/27/2021   4.  Pt will wean out of sling per MD allowance without adverse effects.  Baseline:  Goal status: GOAL MET Target date: 08/27/2021    5.  Pt will tolerate the initiation of shoulder AAROM without adverse effects.  Baseline:   Goal status: GOAL MET Target date: 08/27/2021   6.  Pt will demo supine shoulder AAROM to be 120 deg in flexion and in 40 deg in ER for progression of AAROM and protocol Baseline:  Goal status:  GOAL MET Target date:  09/10/2021  7.  Pt will demo R shoulder flexion and scaption AROM to at least 100 deg in standing without significant shoulder hike for improved UE elevation. Baseline:  Goal status: ONGOING Target date:  10/01/2021  8.  Pt will be able to perform his self care activities with no > than minimal difficulty.   Baseline:  Goal status: ONGOING Target date:  10/08/2021   LONG TERM GOALS: Target date: 11/10/2021   (Remove Blue Hyperlink)  Pt will demo R shoulder AROM to be [facility] t/o for performance of ADLs and IADLs.  Baseline:  Goal status: ONGOING  2.  Pt will be able to perform his ADLs and IADLs without significant difficulty and pain. Baseline:  Goal status: ONGOING  3.   Pt will be able to perform his normal reaching and overhead activities without significant pain and limitation. Baseline:  Goal status: ONGOING  4.  Pt will demo at least 4/5 MMT strength t/o R shoulder for improved tolerance with and performance of daily activities, functional lifting, and performance of yard work.  Baseline:  Goal status: ONGOING  5.  Pt will be able to perform appropriate functional carrying and lifting without significant pain in order to perform IADLs and household chores. Baseline:  Goal status: ONGOING  6.  Pt will be independent with advanced HEP for improved shoulder strength and stability to assist with returning to desired level of  function and performing car work and yard work. Baseline:  Goal status: ONGOING   PLAN: PT FREQUENCY:  2 times per week  PT DURATION: other: 9 weeks  PLANNED INTERVENTIONS: Therapeutic exercises, Therapeutic activity, Neuromuscular re-education, Patient/Family education, Joint mobilization, Aquatic Therapy, Dry Needling, Electrical  stimulation, Spinal mobilization, Cryotherapy, Moist heat, Taping, Ultrasound, Manual therapy, and Re-evaluation  PLAN FOR NEXT SESSION: Cont per Dr. Eddie Dibbles Rotator Cuff Repair protocol with subscapularis restrictions.  Will use subscapularis repair protocol also.  Also has biceps tenodesis restrictions.  Progress slowly, Pt had supraspinatus, infraspinatus, and subscapularis repairs.  PN completed      Selinda Michaels III PT, DPT 09/09/21 4:24 PM

## 2021-09-08 ENCOUNTER — Ambulatory Visit (HOSPITAL_BASED_OUTPATIENT_CLINIC_OR_DEPARTMENT_OTHER): Payer: Medicare PPO | Admitting: Physical Therapy

## 2021-09-08 DIAGNOSIS — M25611 Stiffness of right shoulder, not elsewhere classified: Secondary | ICD-10-CM

## 2021-09-08 DIAGNOSIS — M25511 Pain in right shoulder: Secondary | ICD-10-CM

## 2021-09-08 DIAGNOSIS — M6281 Muscle weakness (generalized): Secondary | ICD-10-CM

## 2021-09-09 ENCOUNTER — Encounter (HOSPITAL_BASED_OUTPATIENT_CLINIC_OR_DEPARTMENT_OTHER): Payer: Self-pay | Admitting: Physical Therapy

## 2021-09-10 ENCOUNTER — Ambulatory Visit (HOSPITAL_BASED_OUTPATIENT_CLINIC_OR_DEPARTMENT_OTHER): Payer: Medicare PPO | Admitting: Physical Therapy

## 2021-09-10 DIAGNOSIS — M25511 Pain in right shoulder: Secondary | ICD-10-CM | POA: Diagnosis not present

## 2021-09-10 DIAGNOSIS — M6281 Muscle weakness (generalized): Secondary | ICD-10-CM

## 2021-09-10 DIAGNOSIS — M25611 Stiffness of right shoulder, not elsewhere classified: Secondary | ICD-10-CM

## 2021-09-10 NOTE — Therapy (Signed)
OUTPATIENT PHYSICAL THERAPY SHOULDER TREATMENT NOTE         Patient Name: Chad Manning MRN: [id number] DOB:Jan 16, 1955, 66 y.o., male Today's Date: 09/11/2021     PT End of Session - 09/10/21 1034     Visit Number 11    Number of Visits 28    Date for PT Re-Evaluation 11/10/21    Authorization Type [employer]    PT Start Time 1025    PT Stop Time 1104    PT Time Calculation (min) 39 min    Activity Tolerance Patient tolerated treatment well;No increased pain    Behavior During Therapy WFL for tasks assessed/performed                   Past Medical History:  Diagnosis Date   Arthritis    Asthma    x 1 age 65   Hyperlipidemia    Right rotator cuff tear    Past Surgical History:  Procedure Laterality Date   COLONOSCOPY     JOINT REPLACEMENT     SHOULDER ARTHROSCOPY WITH ROTATOR CUFF REPAIR AND OPEN BICEPS TENODESIS Right 07/13/2021   Procedure: RIGHT SHOULDER ARTHROSCOPY WITH ROTATOR CUFF REPAIR AND BICEPS TENODESIS;  Surgeon: [facility] Mulders, MD;  Location: [facility];  Service: Orthopedics;  Laterality: Right;   TOTAL HIP ARTHROPLASTY     bilatera   Patient Active Problem List   Diagnosis Date Noted   Traumatic complete tear of right rotator cuff    Former smoker 12/14/2019   Acquired hallux rigidus of right foot 11/29/2018   Dyslipidemia 03/15/2018   Hyperglycemia 03/15/2018     REFERRING PROVIDER: [facility]etta Mulders, MD   REFERRING DIAG: S46.011A (ICD-10-CM) - Traumatic complete tear of right rotator cuff, initial encounter   THERAPY DIAG:  Right shoulder pain, unspecified chronicity  Stiffness of right shoulder, not elsewhere classified  Muscle weakness (generalized)  Rationale for Evaluation and Treatment Rehabilitation  ONSET DATE: Injury 06/05/2021 ; DOS 07/13/2021  SUBJECTIVE:                                                                                                                                                                              SUBJECTIVE STATEMENT: Pt is 8 weeks and 3 days s/p supraspinatus, infraspinatus, and subscapularis repairs, biceps tenodesis, and subacromial decompression.   Pt denies any adverse effects after prior Rx.  Pt reports no pain currently.  Pt reports compliance with HEP and has no adverse effects with HEP.    FUNCTIONAL IMPROVEMENTS:  dressing, bathing  Able to put socks on.  Able to use it more with daily activities.  Washing hair, able to get wallet out of back pocket FUNCTIONAL LIMITATIONS:  reaching behind head,  washing hair, reaching, lifting, reaching the shampoo bottle, overhead activities.  Unable to perform yard and car work.  Limited per protocol.     PERTINENT HISTORY: R shoulder arthroscopic rotator cuff repair of supraspinatus, infraspinatus, and subscapularis, biceps tenodesis, and subacromial decompression on 07/13/2021.  MD message indicated subscapularis restrictions also.  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 None at rest NPRS:  Current, worst, and best:  0/10 Location:  R shoulder  PRECAUTIONS: Other: RCR of supraspinatus, infraspinatus, and subscapularis.  Hx of bilat THA  WEIGHT BEARING RESTRICTIONS Yes NWB R UE    FALLS:  Has patient fallen in last 6 months? Yes. Number of falls 1, this injury  LIVING ENVIRONMENT: Lives with: lives with their spouse Lives in: 1 story home Stairs: 2 steps without rail to enter home   OCCUPATION: Pt is retired  PLOF: Independent; Pt was able to perform all of his ADLs/IADLs and reaching and overhead activities independently without limitation.  Pt was able to perform yard work and work on car without limitation.  Pt was able to pick up granddaughter.   PATIENT GOALS return to PLOF, have full ROM and strength, to be able to perform car work and yard work, pick up granddaughter      OBJECTIVE:   DIAGNOSTIC FINDINGS:  Pt had x rays and a MRI prior to surgery.     TODAY'S TREATMENT:    Therapeutic Exercise: -Reviewed current function, pain level, HEP compliance, and response to prior Rx.  -Pt performed:      Supine wand ER w/n protocol range x10 reps    Seated Pulleys in flexion  x20 reps and scaption x30 reps each    Standing wall walks in flexion AAROM x10 reps    Supine flexion AROM 3x10 reps    Supine serratus punches A/AAROM 2x10 reps    S/L abd 2x10 reps thru limited range    Prone extension 2x10 reps to neutral    Supine shoulder ABC x 1 rep    -Reviewed HEP and updated HEP.  PT gave pt a HEP handout and was educated incorrect form and appropriate frequency.      See below for pt education.        Manual Therapy: Pt received gentle distraction with oscillation and Grade II posterior and inferior jt mobs to R GH joint in supine to improve pain, ROM, and to normalize arthrokinematics f/b R shoulder PROM in flexion, abduction, ER, and IR per protocol ranges.     PATIENT EDUCATION: Education details:  PT answered Pt's questions.  protocol restrictions, progression of protocol, POC, HEP, and exercise form.  Person educated: Patient Education method: Explanation, Demonstration, Tactile cues, and Verbal cues Education comprehension: verbalized understanding, returned demonstration, verbal cues required, tactile cues required, and needs further education   HOME EXERCISE PROGRAM: Access Code: [email or web address] URL: [email or web address] Date: 07/17/2021 Prepared by: Ronny Flurry  Updated HEP: - Single Arm Serratus Punches  - 1 x daily - 6-7 x weekly - 2 sets - 10 reps - Prone Shoulder Extension - Single Arm  - 1 x daily - 5-6 x weekly - 2 sets - 10 reps   ASSESSMENT:  CLINICAL IMPRESSION: Pt continues to progress well with R shoulder PROM and AAROM.  Pt is able to perform supine flexion AROM well with gravity minimized and demonstrates improved form with supine serratus punches actively.  PT progressed AROM with gravity minimized today and pt  performed exercises well with cuing for correct form.  He did require increased cuing with supine shoulder ABC.  PT updated HEP accordingly and Pt demonstrates good understanding.  Pt responded well to Rx having no pain after Rx.  He should benefit from continued skilled PT services per protocol to improve ROM and to restore desired level of function.       OBJECTIVE IMPAIRMENTS decreased activity tolerance, decreased endurance, decreased ROM, decreased strength, hypomobility, impaired flexibility, and pain.   ACTIVITY LIMITATIONS carrying, lifting, bathing, dressing, reach over head, hygiene/grooming, and caring for others  PARTICIPATION LIMITATIONS: meal prep, cleaning, community activity, and yard work   [employer] POTENTIAL: Good  CLINICAL DECISION MAKING: Stable/uncomplicated  EVALUATION COMPLEXITY: Low   GOALS:  SHORT TERM GOALS:   Pt will be independent and compliant with HEP for improved ROM, pain, and function.  Baseline: Goal status: GOAL MET Target date: 08/27/2021   2.  Pt will tolerate R shoulder PROM per protocol without significant pain for improved mobility and stiffness Baseline:  Goal status: GOAL MET  Target date: 08/06/2021   3.  Pt will demo R shoulder PROM to be 140 deg in flexion and 30 deg in ER for improved mobility and stiffness.  Baseline:  Goal status: GOAL MET Target date: 08/27/2021   4.  Pt will wean out of sling per MD allowance without adverse effects.  Baseline:  Goal status: GOAL MET Target date: 08/27/2021    5.  Pt will tolerate the initiation of shoulder AAROM without adverse effects.  Baseline:  Goal status: GOAL MET Target date: 08/27/2021   6.  Pt will demo supine shoulder AAROM to be 120 deg in flexion and in 40 deg in ER for progression of AAROM and protocol Baseline:  Goal status:  GOAL MET Target date:  09/10/2021  7.  Pt will demo R shoulder flexion and scaption AROM to at least 100 deg in standing without significant shoulder  hike for improved UE elevation. Baseline:  Goal status: ONGOING Target date:  10/01/2021  8.  Pt will be able to perform his self care activities with no > than minimal difficulty.   Baseline:  Goal status: ONGOING Target date:  10/08/2021   LONG TERM GOALS: Target date: 11/10/2021   (Remove Blue Hyperlink)  Pt will demo R shoulder AROM to be [facility] t/o for performance of ADLs and IADLs.  Baseline:  Goal status: ONGOING  2.  Pt will be able to perform his ADLs and IADLs without significant difficulty and pain. Baseline:  Goal status: ONGOING  3.   Pt will be able to perform his normal reaching and overhead activities without significant pain and limitation. Baseline:  Goal status: ONGOING  4.  Pt will demo at least 4/5 MMT strength t/o R shoulder for improved tolerance with and performance of daily activities, functional lifting, and performance of yard work.  Baseline:  Goal status: ONGOING  5.  Pt will be able to perform appropriate functional carrying and lifting without significant pain in order to perform IADLs and household chores. Baseline:  Goal status: ONGOING  6.  Pt will be independent with advanced HEP for improved shoulder strength and stability to assist with returning to desired level of function and performing car work and yard work. Baseline:  Goal status: ONGOING   PLAN: PT FREQUENCY:  2 times per week  PT DURATION: other: 9 weeks  PLANNED INTERVENTIONS: Therapeutic exercises, Therapeutic activity, Neuromuscular re-education, Patient/Family education, Joint mobilization, Aquatic Therapy, Dry Needling,  Electrical stimulation, Spinal mobilization, Cryotherapy, Moist heat, Taping, Ultrasound, Manual therapy, and Re-evaluation  PLAN FOR NEXT SESSION: Cont per Dr. Eddie Dibbles Rotator Cuff Repair protocol with subscapularis restrictions.  Will use subscapularis repair protocol also.  Also has biceps tenodesis restrictions.  Progress slowly, Pt had supraspinatus,  infraspinatus, and subscapularis repairs.  Perform submax isometrics next visit.     Selinda Michaels III PT, DPT 09/11/21 7:27 AM

## 2021-09-11 ENCOUNTER — Encounter (HOSPITAL_BASED_OUTPATIENT_CLINIC_OR_DEPARTMENT_OTHER): Payer: Self-pay | Admitting: Physical Therapy

## 2021-09-13 NOTE — Therapy (Signed)
OUTPATIENT PHYSICAL THERAPY SHOULDER TREATMENT NOTE         Patient Name: Chad Manning MRN: [id number] DOB:May 09, 1955, 66 y.o., male Today's Date: 09/15/2021     PT End of Session - 09/15/21 1105     Visit Number 12    Number of Visits 28    Date for PT Re-Evaluation 11/10/21    Authorization Type [employer]    PT Start Time 1020    PT Stop Time 1101    PT Time Calculation (min) 41 min    Activity Tolerance Patient tolerated treatment well;No increased pain    Behavior During Therapy WFL for tasks assessed/performed                    Past Medical History:  Diagnosis Date   Arthritis    Asthma    x 1 age 18   Hyperlipidemia    Right rotator cuff tear    Past Surgical History:  Procedure Laterality Date   COLONOSCOPY     JOINT REPLACEMENT     SHOULDER ARTHROSCOPY WITH ROTATOR CUFF REPAIR AND OPEN BICEPS TENODESIS Right 07/13/2021   Procedure: RIGHT SHOULDER ARTHROSCOPY WITH ROTATOR CUFF REPAIR AND BICEPS TENODESIS;  Surgeon: [facility] Mulders, MD;  Location: [facility];  Service: Orthopedics;  Laterality: Right;   TOTAL HIP ARTHROPLASTY     bilatera   Patient Active Problem List   Diagnosis Date Noted   Traumatic complete tear of right rotator cuff    Former smoker 12/14/2019   Acquired hallux rigidus of right foot 11/29/2018   Dyslipidemia 03/15/2018   Hyperglycemia 03/15/2018     REFERRING PROVIDER: [facility] Mulders, MD   REFERRING DIAG: S46.011A (ICD-10-CM) - Traumatic complete tear of right rotator cuff, initial encounter   THERAPY DIAG:  Right shoulder pain, unspecified chronicity  Stiffness of right shoulder, not elsewhere classified  Muscle weakness (generalized)  Rationale for Evaluation and Treatment Rehabilitation  ONSET DATE: Injury 06/05/2021 ; DOS 07/13/2021  SUBJECTIVE:                                                                                                                                                                              SUBJECTIVE STATEMENT: Pt is 9 weeks and 1 day s/p supraspinatus, infraspinatus, and subscapularis repairs, biceps tenodesis, and subacromial decompression.   Pt denies any adverse effects after prior Rx.  Pt reports no pain currently.  Pt reports compliance with HEP and has no adverse effects with HEP.  "It's getting better.  I'm starting to do things unconsciously."  FUNCTIONAL IMPROVEMENTS:  dressing, bathing  Able to put socks on.  Able to use it more with daily activities.  Washing hair, able to get wallet  out of back pocket, threading belt FUNCTIONAL LIMITATIONS:  reaching behind head, washing hair, reaching, lifting, reaching the shampoo bottle, overhead activities.  Unable to perform yard and car work.  Limited per protocol.     PERTINENT HISTORY: R shoulder arthroscopic rotator cuff repair of supraspinatus, infraspinatus, and subscapularis, biceps tenodesis, and subacromial decompression on 07/13/2021.  MD message indicated subscapularis restrictions also.  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 None at rest NPRS:  Current, worst, and best:  0/10 Location:  R shoulder  PRECAUTIONS: Other: RCR of supraspinatus, infraspinatus, and subscapularis.  Hx of bilat THA  WEIGHT BEARING RESTRICTIONS Yes NWB R UE    FALLS:  Has patient fallen in last 6 months? Yes. Number of falls 1, this injury  LIVING ENVIRONMENT: Lives with: lives with their spouse Lives in: 1 story home Stairs: 2 steps without rail to enter home   OCCUPATION: Pt is retired  PLOF: Independent; Pt was able to perform all of his ADLs/IADLs and reaching and overhead activities independently without limitation.  Pt was able to perform yard work and work on car without limitation.  Pt was able to pick up granddaughter.   PATIENT GOALS return to PLOF, have full ROM and strength, to be able to perform car work and yard work, pick up granddaughter      OBJECTIVE:   DIAGNOSTIC  FINDINGS:  Pt had x rays and a MRI prior to surgery.     TODAY'S TREATMENT:   Therapeutic Exercise: -Reviewed current function, pain level, HEP compliance, and response to prior Rx.  -Pt performed:      Supine wand ER w/n protocol range x10 reps    Seated Pulleys in flexion  x20 reps and scaption x20 reps each    Standing wall walks in flexion AAROM x12 reps    Supine flexion AROM x10 reps and in reclined position with head elevated x 10 reps     Supine serratus punches AROM 2x10 reps    S/L abd x10 reps thru limited range    Prone extension 2x10 reps to neutral    Prone row 2x10 reps    Supine shoulder ABC x 1 rep    Standing wand flexion x 10 reps in front of mirror    Shoulder submaximal isometrics with 5-6 sec hold in flex, abd, and extension x 10 reps     See below for pt education.        Manual Therapy: Pt received gentle distraction with oscillation and Grade II posterior and inferior jt mobs to R GH joint in supine to improve pain, ROM, and to normalize arthrokinematics f/b R shoulder PROM in flexion, abduction, ER, and IR per protocol ranges.     PATIENT EDUCATION: Education details:  PT answered Pt's questions.  protocol restrictions, progression of protocol, POC, HEP, and exercise form.  Person educated: Patient Education method: Explanation, Demonstration, Tactile cues, and Verbal cues Education comprehension: verbalized understanding, returned demonstration, verbal cues required, tactile cues required, and needs further education   HOME EXERCISE PROGRAM: Access Code: [email or web address] URL: [email or web address] Date: 07/17/2021 Prepared by: Ronny Flurry    ASSESSMENT:  CLINICAL IMPRESSION: Pt is progressing well with ROM, function, and protocol.  He demonstrates good PROM t/o R shoulder.  Pt is improving with AAROM and AROM in gravity reduced ranges.  He has improved AROM with supine flexion AROM and was able to perform in a reclined position with  increased  gravity with good AROM.  He also demonstrates improved ROM with standing wall walks in flexion and improved form and ease with supine serratus punch.  PT progressed exercises per protocol and Pt performed exercises well without c/o's.  He continues to have much difficulty and decreased ROM with standing wand flexion.  Pt responded well to Rx having no pain after Rx.  He should continue to benefit from continued skilled PT services per protocol to improve ROM and to restore desired level of function.        OBJECTIVE IMPAIRMENTS decreased activity tolerance, decreased endurance, decreased ROM, decreased strength, hypomobility, impaired flexibility, and pain.   ACTIVITY LIMITATIONS carrying, lifting, bathing, dressing, reach over head, hygiene/grooming, and caring for others  PARTICIPATION LIMITATIONS: meal prep, cleaning, community activity, and yard work   [employer] POTENTIAL: Good  CLINICAL DECISION MAKING: Stable/uncomplicated  EVALUATION COMPLEXITY: Low   GOALS:  SHORT TERM GOALS:   Pt will be independent and compliant with HEP for improved ROM, pain, and function.  Baseline: Goal status: GOAL MET Target date: 08/27/2021   2.  Pt will tolerate R shoulder PROM per protocol without significant pain for improved mobility and stiffness Baseline:  Goal status: GOAL MET  Target date: 08/06/2021   3.  Pt will demo R shoulder PROM to be 140 deg in flexion and 30 deg in ER for improved mobility and stiffness.  Baseline:  Goal status: GOAL MET Target date: 08/27/2021   4.  Pt will wean out of sling per MD allowance without adverse effects.  Baseline:  Goal status: GOAL MET Target date: 08/27/2021    5.  Pt will tolerate the initiation of shoulder AAROM without adverse effects.  Baseline:  Goal status: GOAL MET Target date: 08/27/2021   6.  Pt will demo supine shoulder AAROM to be 120 deg in flexion and in 40 deg in ER for progression of AAROM and protocol Baseline:  Goal  status:  GOAL MET Target date:  09/10/2021  7.  Pt will demo R shoulder flexion and scaption AROM to at least 100 deg in standing without significant shoulder hike for improved UE elevation. Baseline:  Goal status: ONGOING Target date:  10/01/2021  8.  Pt will be able to perform his self care activities with no > than minimal difficulty.   Baseline:  Goal status: ONGOING Target date:  10/08/2021   LONG TERM GOALS: Target date: 11/10/2021   (Remove Blue Hyperlink)  Pt will demo R shoulder AROM to be [facility] t/o for performance of ADLs and IADLs.  Baseline:  Goal status: ONGOING  2.  Pt will be able to perform his ADLs and IADLs without significant difficulty and pain. Baseline:  Goal status: ONGOING  3.   Pt will be able to perform his normal reaching and overhead activities without significant pain and limitation. Baseline:  Goal status: ONGOING  4.  Pt will demo at least 4/5 MMT strength t/o R shoulder for improved tolerance with and performance of daily activities, functional lifting, and performance of yard work.  Baseline:  Goal status: ONGOING  5.  Pt will be able to perform appropriate functional carrying and lifting without significant pain in order to perform IADLs and household chores. Baseline:  Goal status: ONGOING  6.  Pt will be independent with advanced HEP for improved shoulder strength and stability to assist with returning to desired level of function and performing car work and yard work. Baseline:  Goal status: ONGOING   PLAN: PT FREQUENCY:  2 times per week  PT DURATION: other: 9 weeks  PLANNED INTERVENTIONS: Therapeutic exercises, Therapeutic activity, Neuromuscular re-education, Patient/Family education, Joint mobilization, Aquatic Therapy, Dry Needling, Electrical stimulation, Spinal mobilization, Cryotherapy, Moist heat, Taping, Ultrasound, Manual therapy, and Re-evaluation  PLAN FOR NEXT SESSION: Cont per Dr. Eddie Dibbles Rotator Cuff Repair protocol  with subscapularis restrictions.  Will use subscapularis repair protocol also.  Also has biceps tenodesis restrictions.  Progress slowly, Pt had supraspinatus, infraspinatus, and subscapularis repairs.  Perform submax isometrics next visit.     Selinda Michaels III PT, DPT 09/15/21 1:17 PM

## 2021-09-15 ENCOUNTER — Encounter (HOSPITAL_BASED_OUTPATIENT_CLINIC_OR_DEPARTMENT_OTHER): Payer: Self-pay | Admitting: Physical Therapy

## 2021-09-15 ENCOUNTER — Ambulatory Visit (HOSPITAL_BASED_OUTPATIENT_CLINIC_OR_DEPARTMENT_OTHER): Payer: Medicare PPO | Attending: Orthopaedic Surgery | Admitting: Physical Therapy

## 2021-09-15 DIAGNOSIS — M25611 Stiffness of right shoulder, not elsewhere classified: Secondary | ICD-10-CM | POA: Insufficient documentation

## 2021-09-15 DIAGNOSIS — M6281 Muscle weakness (generalized): Secondary | ICD-10-CM | POA: Insufficient documentation

## 2021-09-15 DIAGNOSIS — M25511 Pain in right shoulder: Secondary | ICD-10-CM | POA: Insufficient documentation

## 2021-09-16 NOTE — Therapy (Signed)
OUTPATIENT PHYSICAL THERAPY SHOULDER TREATMENT NOTE         Patient Name: Chad Manning MRN: [id number] DOB:06-29-1955, 66 y.o., male Today's Date: 09/17/2021     PT End of Session - 09/17/21 1045     Visit Number 13    Number of Visits 28    Date for PT Re-Evaluation 11/10/21    Authorization Type [employer]    Progress Note Due on Visit 28    PT Start Time 1020    PT Stop Time 1101    PT Time Calculation (min) 41 min    Activity Tolerance Patient tolerated treatment well;No increased pain    Behavior During Therapy WFL for tasks assessed/performed                     Past Medical History:  Diagnosis Date   Arthritis    Asthma    x 1 age 50   Hyperlipidemia    Right rotator cuff tear    Past Surgical History:  Procedure Laterality Date   COLONOSCOPY     JOINT REPLACEMENT     SHOULDER ARTHROSCOPY WITH ROTATOR CUFF REPAIR AND OPEN BICEPS TENODESIS Right 07/13/2021   Procedure: RIGHT SHOULDER ARTHROSCOPY WITH ROTATOR CUFF REPAIR AND BICEPS TENODESIS;  Surgeon: Vanetta Mulders, MD;  Location: [facility];  Service: Orthopedics;  Laterality: Right;   TOTAL HIP ARTHROPLASTY     bilatera   Patient Active Problem List   Diagnosis Date Noted   Traumatic complete tear of right rotator cuff    Former smoker 12/14/2019   Acquired hallux rigidus of right foot 11/29/2018   Dyslipidemia 03/15/2018   Hyperglycemia 03/15/2018     REFERRING PROVIDER: Vanetta Mulders, MD   REFERRING DIAG: S46.011A (ICD-10-CM) - Traumatic complete tear of right rotator cuff, initial encounter   THERAPY DIAG:  Right shoulder pain, unspecified chronicity  Stiffness of right shoulder, not elsewhere classified  Muscle weakness (generalized)  Rationale for Evaluation and Treatment Rehabilitation  ONSET DATE: Injury 06/05/2021 ; DOS 07/13/2021  SUBJECTIVE:                                                                                                                                                                              SUBJECTIVE STATEMENT: Pt is 9 weeks and 3 days s/p supraspinatus, infraspinatus, and subscapularis repairs, biceps tenodesis, and subacromial decompression.   Pt denies any adverse effects after prior Rx.  Pt reports no pain currently.  Pt reports compliance with HEP and has no adverse effects with HEP.  Pt states his shoulder was a little tight this AM.  FUNCTIONAL IMPROVEMENTS:  dressing, bathing  Able to put socks on.  Able to use it more  with daily activities.  Washing hair, able to get wallet out of back pocket, threading belt FUNCTIONAL LIMITATIONS:  reaching behind head, washing hair, reaching, lifting, reaching the shampoo bottle, overhead activities.  Unable to perform yard and car work.  Limited per protocol.     PERTINENT HISTORY: R shoulder arthroscopic rotator cuff repair of supraspinatus, infraspinatus, and subscapularis, biceps tenodesis, and subacromial decompression on 07/13/2021.  MD message indicated subscapularis restrictions also.  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 None at rest NPRS:  Current, worst, and best:  0/10 Location:  R shoulder  PRECAUTIONS: Other: RCR of supraspinatus, infraspinatus, and subscapularis.  Hx of bilat THA  WEIGHT BEARING RESTRICTIONS Yes NWB R UE    FALLS:  Has patient fallen in last 6 months? Yes. Number of falls 1, this injury  LIVING ENVIRONMENT: Lives with: lives with their spouse Lives in: 1 story home Stairs: 2 steps without rail to enter home   OCCUPATION: Pt is retired  PLOF: Independent; Pt was able to perform all of his ADLs/IADLs and reaching and overhead activities independently without limitation.  Pt was able to perform yard work and work on car without limitation.  Pt was able to pick up granddaughter.   PATIENT GOALS return to PLOF, have full ROM and strength, to be able to perform car work and yard work, pick up granddaughter      OBJECTIVE:    DIAGNOSTIC FINDINGS:  Pt had x rays and a MRI prior to surgery.     TODAY'S TREATMENT:   Therapeutic Exercise: -Reviewed current function, pain level, HEP compliance, and response to prior Rx.  -Pt performed:      Standing wall walks in flexion AAROM x12 reps    Supine flexion AROM in reclined position with head elevated 2 x 10 reps     Supine serratus punches AROM 2x10 reps    Prone extension 2x12 reps to neutral    Prone row 2x10 reps    Supine shoulder ABC x 1 rep    S/L ER 2x10 reps    Shoulder submaximal isometrics with 5-6 sec hold in flex, abd, extension, ER, and IR x 10 reps    PT updated HEP and gave pt a HEP handout.  Educated pt in correct form and appropriate frequency.  PT instructed pt to push gently and he should not have pain with submaximal isometrics.          Manual Therapy: Pt received gentle distraction with oscillation and Grade II posterior and inferior jt mobs to R GH joint in supine to improve pain, ROM, and to normalize arthrokinematics f/b R shoulder PROM in flexion, abduction, ER, and IR per protocol ranges.     PATIENT EDUCATION: Education details:  PT answered Pt's questions.  protocol restrictions, progression of protocol, POC, HEP, and exercise form.  Person educated: Patient Education method: Explanation, Demonstration, Tactile cues, Verbal cues, and handout. Education comprehension: verbalized understanding, returned demonstration, verbal cues required, tactile cues required, and needs further education   HOME EXERCISE PROGRAM: Access Code: [email or web address] URL: [email or web address] Date: 07/17/2021 Prepared by: Ronny Flurry  Updated HEP: - Isometric Shoulder Flexion at Wall  - 1 x daily - 5 x weekly - 1-2 sets - 10 reps - 5-6 seconds hold - Isometric Shoulder Abduction at Wall  - 1 x daily - 5 x weekly - 1-2 sets - 10 reps - 5-6 seconds hold - Isometric Shoulder Extension at  Wall  - 1 x daily - 5 x weekly - 1-2 sets - 10 reps -  5-6 seconds hold - Standing Isometric Shoulder External Rotation with Doorway  - 1 x daily - 5 x weekly - 1-2 sets - 10 reps - 5-6 seconds hold - Standing Isometric Shoulder Internal Rotation at Doorway  - 1 x daily - 5 x weekly - 1-2 sets - 10 reps - 5-6 seconds hold    ASSESSMENT:  CLINICAL IMPRESSION: Pt is progressing well with ROM, function, and protocol.  He demonstrates good PROM t/o R shoulder.  Pt is improving with AAROM and AROM in gravity reduced ranges.  Pt able to perform S/L ER AROM well with cuing for correct positioning.  He continues to be limited with UE elevation against gravity.  Pt is progressing with exercises per protocol and performed exercises well with cuing and instruction for correct form.  PT updated HEP with submaximal isometrics and gave pt a handout.  Pt responded well to Rx having no pain after Rx.  He should continue to benefit from continued skilled PT services per protocol to improve ROM and to restore desired level of function.          OBJECTIVE IMPAIRMENTS decreased activity tolerance, decreased endurance, decreased ROM, decreased strength, hypomobility, impaired flexibility, and pain.   ACTIVITY LIMITATIONS carrying, lifting, bathing, dressing, reach over head, hygiene/grooming, and caring for others  PARTICIPATION LIMITATIONS: meal prep, cleaning, community activity, and yard work   [employer] POTENTIAL: Good  CLINICAL DECISION MAKING: Stable/uncomplicated  EVALUATION COMPLEXITY: Low   GOALS:  SHORT TERM GOALS:   Pt will be independent and compliant with HEP for improved ROM, pain, and function.  Baseline: Goal status: GOAL MET Target date: 08/27/2021   2.  Pt will tolerate R shoulder PROM per protocol without significant pain for improved mobility and stiffness Baseline:  Goal status: GOAL MET  Target date: 08/06/2021   3.  Pt will demo R shoulder PROM to be 140 deg in flexion and 30 deg in ER for improved mobility and stiffness.   Baseline:  Goal status: GOAL MET Target date: 08/27/2021   4.  Pt will wean out of sling per MD allowance without adverse effects.  Baseline:  Goal status: GOAL MET Target date: 08/27/2021    5.  Pt will tolerate the initiation of shoulder AAROM without adverse effects.  Baseline:  Goal status: GOAL MET Target date: 08/27/2021   6.  Pt will demo supine shoulder AAROM to be 120 deg in flexion and in 40 deg in ER for progression of AAROM and protocol Baseline:  Goal status:  GOAL MET Target date:  09/10/2021  7.  Pt will demo R shoulder flexion and scaption AROM to at least 100 deg in standing without significant shoulder hike for improved UE elevation. Baseline:  Goal status: ONGOING Target date:  10/01/2021  8.  Pt will be able to perform his self care activities with no > than minimal difficulty.   Baseline:  Goal status: ONGOING Target date:  10/08/2021   LONG TERM GOALS: Target date: 11/10/2021   (Remove Blue Hyperlink)  Pt will demo R shoulder AROM to be [facility] t/o for performance of ADLs and IADLs.  Baseline:  Goal status: ONGOING  2.  Pt will be able to perform his ADLs and IADLs without significant difficulty and pain. Baseline:  Goal status: ONGOING  3.   Pt will be able to perform his normal reaching and overhead activities without significant  pain and limitation. Baseline:  Goal status: ONGOING  4.  Pt will demo at least 4/5 MMT strength t/o R shoulder for improved tolerance with and performance of daily activities, functional lifting, and performance of yard work.  Baseline:  Goal status: ONGOING  5.  Pt will be able to perform appropriate functional carrying and lifting without significant pain in order to perform IADLs and household chores. Baseline:  Goal status: ONGOING  6.  Pt will be independent with advanced HEP for improved shoulder strength and stability to assist with returning to desired level of function and performing car work and yard  work. Baseline:  Goal status: ONGOING   PLAN: PT FREQUENCY:  2 times per week  PT DURATION: other: 9 weeks  PLANNED INTERVENTIONS: Therapeutic exercises, Therapeutic activity, Neuromuscular re-education, Patient/Family education, Joint mobilization, Aquatic Therapy, Dry Needling, Electrical stimulation, Spinal mobilization, Cryotherapy, Moist heat, Taping, Ultrasound, Manual therapy, and Re-evaluation  PLAN FOR NEXT SESSION: Cont per Dr. Eddie Dibbles Rotator Cuff Repair protocol with subscapularis restrictions.  Will use subscapularis repair protocol also.  Also has biceps tenodesis restrictions.  Progress slowly, Pt had supraspinatus, infraspinatus, and subscapularis repairs.    Selinda Michaels III PT, DPT 09/17/21 3:38 PM

## 2021-09-17 ENCOUNTER — Encounter (HOSPITAL_BASED_OUTPATIENT_CLINIC_OR_DEPARTMENT_OTHER): Payer: Self-pay | Admitting: Physical Therapy

## 2021-09-17 ENCOUNTER — Ambulatory Visit (HOSPITAL_BASED_OUTPATIENT_CLINIC_OR_DEPARTMENT_OTHER): Payer: Medicare PPO | Admitting: Physical Therapy

## 2021-09-17 DIAGNOSIS — M25611 Stiffness of right shoulder, not elsewhere classified: Secondary | ICD-10-CM | POA: Diagnosis not present

## 2021-09-17 DIAGNOSIS — M25511 Pain in right shoulder: Secondary | ICD-10-CM | POA: Diagnosis not present

## 2021-09-17 DIAGNOSIS — M6281 Muscle weakness (generalized): Secondary | ICD-10-CM

## 2021-09-22 ENCOUNTER — Ambulatory Visit (HOSPITAL_BASED_OUTPATIENT_CLINIC_OR_DEPARTMENT_OTHER): Payer: Medicare PPO | Admitting: Physical Therapy

## 2021-09-22 ENCOUNTER — Encounter (HOSPITAL_BASED_OUTPATIENT_CLINIC_OR_DEPARTMENT_OTHER): Payer: Self-pay | Admitting: Physical Therapy

## 2021-09-22 DIAGNOSIS — M25611 Stiffness of right shoulder, not elsewhere classified: Secondary | ICD-10-CM

## 2021-09-22 DIAGNOSIS — M6281 Muscle weakness (generalized): Secondary | ICD-10-CM

## 2021-09-22 DIAGNOSIS — M25511 Pain in right shoulder: Secondary | ICD-10-CM | POA: Diagnosis not present

## 2021-09-22 NOTE — Therapy (Signed)
OUTPATIENT PHYSICAL THERAPY SHOULDER TREATMENT NOTE         Patient Name: Chad Manning MRN: [id number] DOB:1955/06/15, 66 y.o., male Today's Date: 09/23/2021     PT End of Session - 09/22/21 1101     Visit Number 14    Number of Visits 28    Date for PT Re-Evaluation 11/10/21    Authorization Type [employer]    PT Start Time 1031    PT Stop Time 1110    PT Time Calculation (min) 39 min    Activity Tolerance Patient tolerated treatment well;No increased pain    Behavior During Therapy WFL for tasks assessed/performed                      Past Medical History:  Diagnosis Date   Arthritis    Asthma    x 1 age 67   Hyperlipidemia    Right rotator cuff tear    Past Surgical History:  Procedure Laterality Date   COLONOSCOPY     JOINT REPLACEMENT     SHOULDER ARTHROSCOPY WITH ROTATOR CUFF REPAIR AND OPEN BICEPS TENODESIS Right 07/13/2021   Procedure: RIGHT SHOULDER ARTHROSCOPY WITH ROTATOR CUFF REPAIR AND BICEPS TENODESIS;  Surgeon: Vanetta Mulders, MD;  Location: [facility];  Service: Orthopedics;  Laterality: Right;   TOTAL HIP ARTHROPLASTY     bilatera   Patient Active Problem List   Diagnosis Date Noted   Traumatic complete tear of right rotator cuff    Former smoker 12/14/2019   Acquired hallux rigidus of right foot 11/29/2018   Dyslipidemia 03/15/2018   Hyperglycemia 03/15/2018     REFERRING PROVIDER: Vanetta Mulders, MD   REFERRING DIAG: S46.011A (ICD-10-CM) - Traumatic complete tear of right rotator cuff, initial encounter   THERAPY DIAG:  Right shoulder pain, unspecified chronicity  Stiffness of right shoulder, not elsewhere classified  Muscle weakness (generalized)  Rationale for Evaluation and Treatment Rehabilitation  ONSET DATE: Injury 06/05/2021 ; DOS 07/13/2021  SUBJECTIVE:                                                                                                                                                                              SUBJECTIVE STATEMENT: Pt is 10 weeks and 1 day s/p supraspinatus, infraspinatus, and subscapularis repairs, biceps tenodesis, and subacromial decompression.   Pt denies any adverse effects after prior Rx.  Pt reports no pain currently.  Pt reports compliance with HEP and has no adverse effects with HEP.  Pt wants to be able to lift his UE more.    FUNCTIONAL IMPROVEMENTS:  dressing, bathing  Able to put socks on.  Able to use it more with daily activities.  Washing hair,  able to get wallet out of back pocket, threading belt FUNCTIONAL LIMITATIONS:  reaching behind head, washing hair, reaching, lifting, reaching the shampoo bottle, overhead activities.  Unable to perform yard and car work.  Limited per protocol.     PERTINENT HISTORY: R shoulder arthroscopic rotator cuff repair of supraspinatus, infraspinatus, and subscapularis, biceps tenodesis, and subacromial decompression on 07/13/2021.  MD message indicated subscapularis restrictions also.  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 No NPRS:  Current, worst, and best:  0/10 Location:  R shoulder  PRECAUTIONS: Other: RCR of supraspinatus, infraspinatus, and subscapularis.  Hx of bilat THA  WEIGHT BEARING RESTRICTIONS Yes NWB R UE    FALLS:  Has patient fallen in last 6 months? Yes. Number of falls 1, this injury  LIVING ENVIRONMENT: Lives with: lives with their spouse Lives in: 1 story home Stairs: 2 steps without rail to enter home   OCCUPATION: Pt is retired  PLOF: Independent; Pt was able to perform all of his ADLs/IADLs and reaching and overhead activities independently without limitation.  Pt was able to perform yard work and work on car without limitation.  Pt was able to pick up granddaughter.   PATIENT GOALS return to PLOF, have full ROM and strength, to be able to perform car work and yard work, pick up granddaughter      OBJECTIVE:   DIAGNOSTIC FINDINGS:  Pt had x rays and a  MRI prior to surgery.     TODAY'S TREATMENT:   Therapeutic Exercise: -Reviewed current function, pain level, HEP compliance, and response to prior Rx.  -Pt performed:      Supine flexion AROM in reclined position with head elevated 2 x 10 reps     Supine serratus punches AROM 2x15 reps    Prone extension 2x12 reps to neutral    Prone row 2x10 reps    Prone Horizontal abduction 2x10 reps    Supine shoulder ABC x 1 rep    S/L ER 3x10 reps    Shoulder submaximal isometrics with 5-6 sec hold in flex, abd, extension, ER, and IR x 10 reps       Manual Therapy: Pt received gentle distraction with oscillation and Grade II posterior and inferior jt mobs to R GH joint in supine to improve pain, ROM, and to normalize arthrokinematics f/b R shoulder PROM in flexion, abduction, ER, and IR per protocol ranges.     PATIENT EDUCATION: Education details:  PT answered Pt's questions.  protocol restrictions, progression of protocol, POC, HEP, and exercise form.  Person educated: Patient Education method: Explanation, Demonstration, Tactile cues, Verbal cues, and handout. Education comprehension: verbalized understanding, returned demonstration, verbal cues required, tactile cues required, and needs further education   HOME EXERCISE PROGRAM: Access Code: [email or web address] URL: [email or web address] Date: 07/17/2021 Prepared by: Ronny Flurry  Updated HEP: - Isometric Shoulder Flexion at Wall  - 1 x daily - 5 x weekly - 1-2 sets - 10 reps - 5-6 seconds hold - Isometric Shoulder Abduction at Wall  - 1 x daily - 5 x weekly - 1-2 sets - 10 reps - 5-6 seconds hold - Isometric Shoulder Extension at Wall  - 1 x daily - 5 x weekly - 1-2 sets - 10 reps - 5-6 seconds hold - Standing Isometric Shoulder External Rotation with Doorway  - 1 x daily - 5 x weekly - 1-2 sets - 10 reps - 5-6 seconds hold - Standing Isometric Shoulder  Internal Rotation at Doorway  - 1 x daily - 5 x weekly - 1-2 sets - 10  reps - 5-6 seconds hold    ASSESSMENT:  CLINICAL IMPRESSION: Pt is progressing well with protocol.  He has good PROM.  Pt also demonstrates good form and performance of S/L ER.  Pt was able to perform prone horizontal abd well.  He is able to easily perform shoulder flexion in semi reclined position though continues to struggle with UE elevation against gravity.  Pt wants to be able to lift UE against gravity with improved form and ease.  PT explained to pt the slow process of regaining motion and movement per protocol and also explained his dx and the extent of his surgery.  Pt is compliant with HEP.  He responded well to Rx having no pain after Rx.  He should continue to benefit from continued skilled PT services per protocol to improve ROM and to restore desired level of function.        OBJECTIVE IMPAIRMENTS decreased activity tolerance, decreased endurance, decreased ROM, decreased strength, hypomobility, impaired flexibility, and pain.   ACTIVITY LIMITATIONS carrying, lifting, bathing, dressing, reach over head, hygiene/grooming, and caring for others  PARTICIPATION LIMITATIONS: meal prep, cleaning, community activity, and yard work   [employer] POTENTIAL: Good  CLINICAL DECISION MAKING: Stable/uncomplicated  EVALUATION COMPLEXITY: Low   GOALS:  SHORT TERM GOALS:   Pt will be independent and compliant with HEP for improved ROM, pain, and function.  Baseline: Goal status: GOAL MET Target date: 08/27/2021   2.  Pt will tolerate R shoulder PROM per protocol without significant pain for improved mobility and stiffness Baseline:  Goal status: GOAL MET  Target date: 08/06/2021   3.  Pt will demo R shoulder PROM to be 140 deg in flexion and 30 deg in ER for improved mobility and stiffness.  Baseline:  Goal status: GOAL MET Target date: 08/27/2021   4.  Pt will wean out of sling per MD allowance without adverse effects.  Baseline:  Goal status: GOAL MET Target date: 08/27/2021     5.  Pt will tolerate the initiation of shoulder AAROM without adverse effects.  Baseline:  Goal status: GOAL MET Target date: 08/27/2021   6.  Pt will demo supine shoulder AAROM to be 120 deg in flexion and in 40 deg in ER for progression of AAROM and protocol Baseline:  Goal status:  GOAL MET Target date:  09/10/2021  7.  Pt will demo R shoulder flexion and scaption AROM to at least 100 deg in standing without significant shoulder hike for improved UE elevation. Baseline:  Goal status: ONGOING Target date:  10/01/2021  8.  Pt will be able to perform his self care activities with no > than minimal difficulty.   Baseline:  Goal status: ONGOING Target date:  10/08/2021   LONG TERM GOALS: Target date: 11/10/2021   (Remove Blue Hyperlink)  Pt will demo R shoulder AROM to be [facility] t/o for performance of ADLs and IADLs.  Baseline:  Goal status: ONGOING  2.  Pt will be able to perform his ADLs and IADLs without significant difficulty and pain. Baseline:  Goal status: ONGOING  3.   Pt will be able to perform his normal reaching and overhead activities without significant pain and limitation. Baseline:  Goal status: ONGOING  4.  Pt will demo at least 4/5 MMT strength t/o R shoulder for improved tolerance with and performance of daily activities, functional lifting, and performance of  yard work.  Baseline:  Goal status: ONGOING  5.  Pt will be able to perform appropriate functional carrying and lifting without significant pain in order to perform IADLs and household chores. Baseline:  Goal status: ONGOING  6.  Pt will be independent with advanced HEP for improved shoulder strength and stability to assist with returning to desired level of function and performing car work and yard work. Baseline:  Goal status: ONGOING   PLAN: PT FREQUENCY:  2 times per week  PT DURATION: other: 9 weeks  PLANNED INTERVENTIONS: Therapeutic exercises, Therapeutic activity, Neuromuscular  re-education, Patient/Family education, Joint mobilization, Aquatic Therapy, Dry Needling, Electrical stimulation, Spinal mobilization, Cryotherapy, Moist heat, Taping, Ultrasound, Manual therapy, and Re-evaluation  PLAN FOR NEXT SESSION: Cont per Dr. Eddie Dibbles Rotator Cuff Repair protocol with subscapularis restrictions.  Will use subscapularis repair protocol also.  Also has biceps tenodesis restrictions.  Progress slowly, Pt had supraspinatus, infraspinatus, and subscapularis repairs.    Selinda Michaels III PT, DPT 09/23/21 8:11 AM

## 2021-09-24 ENCOUNTER — Encounter (HOSPITAL_BASED_OUTPATIENT_CLINIC_OR_DEPARTMENT_OTHER): Payer: Self-pay | Admitting: Physical Therapy

## 2021-09-24 ENCOUNTER — Ambulatory Visit (HOSPITAL_BASED_OUTPATIENT_CLINIC_OR_DEPARTMENT_OTHER): Payer: Medicare PPO | Admitting: Physical Therapy

## 2021-09-24 DIAGNOSIS — M6281 Muscle weakness (generalized): Secondary | ICD-10-CM | POA: Diagnosis not present

## 2021-09-24 DIAGNOSIS — M25611 Stiffness of right shoulder, not elsewhere classified: Secondary | ICD-10-CM

## 2021-09-24 DIAGNOSIS — M25511 Pain in right shoulder: Secondary | ICD-10-CM

## 2021-09-24 NOTE — Therapy (Signed)
OUTPATIENT PHYSICAL THERAPY SHOULDER TREATMENT NOTE         Patient Name: Chad Manning MRN: [id number] DOB:1955/04/06, 66 y.o., male Today's Date: 09/25/2021     PT End of Session - 09/24/21 1028     Visit Number 15    Number of Visits 28    Date for PT Re-Evaluation 11/10/21    Authorization Type [employer]    Progress Note Due on Visit 28    PT Start Time 1025    PT Stop Time 1114    PT Time Calculation (min) 49 min    Activity Tolerance Patient tolerated treatment well;No increased pain    Behavior During Therapy WFL for tasks assessed/performed                       Past Medical History:  Diagnosis Date   Arthritis    Asthma    x 1 age 70   Hyperlipidemia    Right rotator cuff tear    Past Surgical History:  Procedure Laterality Date   COLONOSCOPY     JOINT REPLACEMENT     SHOULDER ARTHROSCOPY WITH ROTATOR CUFF REPAIR AND OPEN BICEPS TENODESIS Right 07/13/2021   Procedure: RIGHT SHOULDER ARTHROSCOPY WITH ROTATOR CUFF REPAIR AND BICEPS TENODESIS;  Surgeon: Vanetta Mulders, MD;  Location: [facility];  Service: Orthopedics;  Laterality: Right;   TOTAL HIP ARTHROPLASTY     bilatera   Patient Active Problem List   Diagnosis Date Noted   Traumatic complete tear of right rotator cuff    Former smoker 12/14/2019   Acquired hallux rigidus of right foot 11/29/2018   Dyslipidemia 03/15/2018   Hyperglycemia 03/15/2018     REFERRING PROVIDER: Vanetta Mulders, MD   REFERRING DIAG: S46.011A (ICD-10-CM) - Traumatic complete tear of right rotator cuff, initial encounter   THERAPY DIAG:  Right shoulder pain, unspecified chronicity  Stiffness of right shoulder, not elsewhere classified  Muscle weakness (generalized)  Rationale for Evaluation and Treatment Rehabilitation  ONSET DATE: Injury 06/05/2021 ; DOS 07/13/2021  SUBJECTIVE:                                                                                                                                                                              SUBJECTIVE STATEMENT: Pt is 10 weeks and 3 days s/p supraspinatus, infraspinatus, and subscapularis repairs, biceps tenodesis, and subacromial decompression.   Pt denies any adverse effects after prior Rx.  Pt reports no pain currently.  Pt reports compliance with HEP and has no adverse effects with HEP.  Pt wants to be able to lift his UE more.    FUNCTIONAL IMPROVEMENTS:  dressing, bathing.  Able to put socks on.  Able  to use it more with daily activities.  Washing hair, able to get wallet out of back pocket, threading belt FUNCTIONAL LIMITATIONS:  reaching behind head, washing hair, reaching, lifting, reaching the shampoo bottle, overhead activities.  Unable to perform yard and car work.  Limited per protocol.     PERTINENT HISTORY: R shoulder arthroscopic rotator cuff repair of supraspinatus, infraspinatus, and subscapularis, biceps tenodesis, and subacromial decompression on 07/13/2021.  MD message indicated subscapularis restrictions also.  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 No NPRS:  Current, worst, and best:  0/10 Location:  R shoulder  PRECAUTIONS: Other: RCR of supraspinatus, infraspinatus, and subscapularis.  Hx of bilat THA  WEIGHT BEARING RESTRICTIONS Yes NWB R UE    FALLS:  Has patient fallen in last 6 months? Yes. Number of falls 1, this injury  LIVING ENVIRONMENT: Lives with: lives with their spouse Lives in: 1 story home Stairs: 2 steps without rail to enter home   OCCUPATION: Pt is retired  PLOF: Independent; Pt was able to perform all of his ADLs/IADLs and reaching and overhead activities independently without limitation.  Pt was able to perform yard work and work on car without limitation.  Pt was able to pick up granddaughter.   PATIENT GOALS return to PLOF, have full ROM and strength, to be able to perform car work and yard work, pick up granddaughter      OBJECTIVE:    DIAGNOSTIC FINDINGS:  Pt had x rays and a MRI prior to surgery.     TODAY'S TREATMENT:   Therapeutic Exercise: -Reviewed current function, pain level, HEP compliance, and response to prior Rx.  -Pt performed:      Supine flexion AROM in reclined position with head elevated 2 x 10 reps     S/L abd 2x10    Supine serratus punches AROM 2x15 reps    Prone extension 2x12 reps to neutral    Prone row 2x10 reps    Prone Horizontal abduction 2x10 reps    Supine shoulder ABC x 1 rep    S/L ER 3x10 reps    Standing wand flexion 2x10 reps    Standing wand scaption x 10 reps    -PT reviewed HEP and updated HEP.  Pt received a HEP handout and was educated in correct form and appropriate frequency.        Manual Therapy: Pt received gentle distraction with oscillation and Grade II posterior and inferior jt mobs to R GH joint in supine to improve pain, ROM, and to normalize arthrokinematics f/b R shoulder PROM in flexion, abduction, ER, and IR per protocol ranges.     PATIENT EDUCATION: Education details:  Correct form and mechanics with UE elevation in standing.  Updated HEP and reviewed HEP. protocol restrictions, progression of protocol, POC, HEP, and exercise form.  Person educated: Patient Education method: Explanation, Demonstration, Tactile cues, Verbal cues, and handout. Education comprehension: verbalized understanding, returned demonstration, verbal cues required, tactile cues required, and needs further education   HOME EXERCISE PROGRAM: Access Code: [email or web address] URL: [email or web address] Date: 07/17/2021 Prepared by: Ronny Flurry  Updated HEP: - Standing Shoulder Flexion AAROM with Dowel  - 2 x daily - 7 x weekly - 1 sets - 10 reps - Sidelying Shoulder External Rotation  - 1 x daily - 5 x weekly - 2-3 sets - 10 reps - Prone Shoulder Row  - 1 x daily - 6 x weekly - 2 sets -  10 reps - Prone Single Arm Shoulder Horizontal Abduction with Scapular Retraction and  Palm Down  - 1 x daily - 5-6 x weekly - 2 sets - 10 reps    ASSESSMENT:  CLINICAL IMPRESSION: Pt is progressing well with protocol.  He has good PROM with some tightness at end range flexion.  Pt also demonstrates good form and performance of S/L ER.  PT increased height of reclined position with supine flexion AROM to improve R UE elevation.  Pt performed supine flexion AROM well in reclined position.   Pt wants to be able to lift UE against gravity with improved form and ease.  PT worked on standing wand elevation.  Pt performed standing wand exercises in front of a mirror for visual cuing for correct form of UE and to decrease shoulder hike.  Pt demonstrates improved AAROM and form with standing wand exercises.  PT explained to pt the slow process of regaining motion and movement per protocol and also explained his dx.  PT updated HEP and pt demonstrates good understanding of HEP.  He responded well to Rx having no pain after Rx.  He should continue to benefit from continued skilled PT services per protocol to improve ROM and to restore desired level of function.      OBJECTIVE IMPAIRMENTS decreased activity tolerance, decreased endurance, decreased ROM, decreased strength, hypomobility, impaired flexibility, and pain.   ACTIVITY LIMITATIONS carrying, lifting, bathing, dressing, reach over head, hygiene/grooming, and caring for others  PARTICIPATION LIMITATIONS: meal prep, cleaning, community activity, and yard work   [employer] POTENTIAL: Good  CLINICAL DECISION MAKING: Stable/uncomplicated  EVALUATION COMPLEXITY: Low   GOALS:  SHORT TERM GOALS:   Pt will be independent and compliant with HEP for improved ROM, pain, and function.  Baseline: Goal status: GOAL MET Target date: 08/27/2021   2.  Pt will tolerate R shoulder PROM per protocol without significant pain for improved mobility and stiffness Baseline:  Goal status: GOAL MET  Target date: 08/06/2021   3.  Pt will demo R  shoulder PROM to be 140 deg in flexion and 30 deg in ER for improved mobility and stiffness.  Baseline:  Goal status: GOAL MET Target date: 08/27/2021   4.  Pt will wean out of sling per MD allowance without adverse effects.  Baseline:  Goal status: GOAL MET Target date: 08/27/2021    5.  Pt will tolerate the initiation of shoulder AAROM without adverse effects.  Baseline:  Goal status: GOAL MET Target date: 08/27/2021   6.  Pt will demo supine shoulder AAROM to be 120 deg in flexion and in 40 deg in ER for progression of AAROM and protocol Baseline:  Goal status:  GOAL MET Target date:  09/10/2021  7.  Pt will demo R shoulder flexion and scaption AROM to at least 100 deg in standing without significant shoulder hike for improved UE elevation. Baseline:  Goal status: ONGOING Target date:  10/01/2021  8.  Pt will be able to perform his self care activities with no > than minimal difficulty.   Baseline:  Goal status: ONGOING Target date:  10/08/2021   LONG TERM GOALS: Target date: 11/10/2021   (Remove Blue Hyperlink)  Pt will demo R shoulder AROM to be [facility] t/o for performance of ADLs and IADLs.  Baseline:  Goal status: ONGOING  2.  Pt will be able to perform his ADLs and IADLs without significant difficulty and pain. Baseline:  Goal status: ONGOING  3.   Pt will  be able to perform his normal reaching and overhead activities without significant pain and limitation. Baseline:  Goal status: ONGOING  4.  Pt will demo at least 4/5 MMT strength t/o R shoulder for improved tolerance with and performance of daily activities, functional lifting, and performance of yard work.  Baseline:  Goal status: ONGOING  5.  Pt will be able to perform appropriate functional carrying and lifting without significant pain in order to perform IADLs and household chores. Baseline:  Goal status: ONGOING  6.  Pt will be independent with advanced HEP for improved shoulder strength and stability to  assist with returning to desired level of function and performing car work and yard work. Baseline:  Goal status: ONGOING   PLAN: PT FREQUENCY:  2 times per week  PT DURATION: other: 9 weeks  PLANNED INTERVENTIONS: Therapeutic exercises, Therapeutic activity, Neuromuscular re-education, Patient/Family education, Joint mobilization, Aquatic Therapy, Dry Needling, Electrical stimulation, Spinal mobilization, Cryotherapy, Moist heat, Taping, Ultrasound, Manual therapy, and Re-evaluation  PLAN FOR NEXT SESSION: Cont per Dr. Eddie Dibbles Rotator Cuff Repair protocol with subscapularis restrictions.  Will use subscapularis repair protocol also.  Also has biceps tenodesis restrictions.  Progress slowly, Pt had supraspinatus, infraspinatus, and subscapularis repairs.    Selinda Michaels III PT, DPT 09/25/21 4:16 PM

## 2021-09-28 NOTE — Therapy (Signed)
OUTPATIENT PHYSICAL THERAPY SHOULDER TREATMENT NOTE         Patient Name: Chad Manning MRN: [id number] DOB:Feb 13, 1955, 66 y.o., male Today's Date: 09/30/2021     PT End of Session - 09/29/21 1026     Visit Number 16    Number of Visits 28    Date for PT Re-Evaluation 11/10/21    Authorization Type [employer]    PT Start Time 1022    PT Stop Time 1104    PT Time Calculation (min) 42 min    Activity Tolerance Patient tolerated treatment well;No increased pain    Behavior During Therapy WFL for tasks assessed/performed                        Past Medical History:  Diagnosis Date   Arthritis    Asthma    x 1 age 57   Hyperlipidemia    Right rotator cuff tear    Past Surgical History:  Procedure Laterality Date   COLONOSCOPY     JOINT REPLACEMENT     SHOULDER ARTHROSCOPY WITH ROTATOR CUFF REPAIR AND OPEN BICEPS TENODESIS Right 07/13/2021   Procedure: RIGHT SHOULDER ARTHROSCOPY WITH ROTATOR CUFF REPAIR AND BICEPS TENODESIS;  Surgeon: Vanetta Mulders, MD;  Location: [facility];  Service: Orthopedics;  Laterality: Right;   TOTAL HIP ARTHROPLASTY     bilatera   Patient Active Problem List   Diagnosis Date Noted   Traumatic complete tear of right rotator cuff    Former smoker 12/14/2019   Acquired hallux rigidus of right foot 11/29/2018   Dyslipidemia 03/15/2018   Hyperglycemia 03/15/2018     REFERRING PROVIDER: Vanetta Mulders, MD   REFERRING DIAG: S46.011A (ICD-10-CM) - Traumatic complete tear of right rotator cuff, initial encounter   THERAPY DIAG:  Right shoulder pain, unspecified chronicity  Stiffness of right shoulder, not elsewhere classified  Muscle weakness (generalized)  Rationale for Evaluation and Treatment Rehabilitation  ONSET DATE: Injury 06/05/2021 ; DOS 07/13/2021  SUBJECTIVE:                                                                                                                                                                              SUBJECTIVE STATEMENT: Pt is 11 weeks and 1 day s/p supraspinatus, infraspinatus, and subscapularis repairs, biceps tenodesis, and subacromial decompression.   Pt denies any adverse effects after prior Rx.  Pt reports no pain currently.  Pt reports compliance with HEP and has no adverse effects with HEP.  Pt states "It's all getting a little better".  Pt reports improved standing wand exercises with ROM and decreased shoulder hike.     FUNCTIONAL IMPROVEMENTS:  dressing, bathing.  Able to put  socks on.  Able to use it more with daily activities.  Washing hair, able to get wallet out of back pocket, threading belt FUNCTIONAL LIMITATIONS:  reaching behind head, washing hair, reaching, lifting, reaching the shampoo bottle, overhead activities.  Unable to perform yard and car work.  Limited per protocol.     PERTINENT HISTORY: R shoulder arthroscopic rotator cuff repair of supraspinatus, infraspinatus, and subscapularis, biceps tenodesis, and subacromial decompression on 07/13/2021.  MD message indicated subscapularis restrictions also.  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 No NPRS:  Current, worst, and best:  0/10 Location:  R shoulder  PRECAUTIONS: Other: RCR of supraspinatus, infraspinatus, and subscapularis.  Hx of bilat THA  WEIGHT BEARING RESTRICTIONS Yes NWB R UE    FALLS:  Has patient fallen in last 6 months? Yes. Number of falls 1, this injury  LIVING ENVIRONMENT: Lives with: lives with their spouse Lives in: 1 story home Stairs: 2 steps without rail to enter home   OCCUPATION: Pt is retired  PLOF: Independent; Pt was able to perform all of his ADLs/IADLs and reaching and overhead activities independently without limitation.  Pt was able to perform yard work and work on car without limitation.  Pt was able to pick up granddaughter.   PATIENT GOALS return to PLOF, have full ROM and strength, to be able to perform car work and yard  work, pick up granddaughter      OBJECTIVE:   DIAGNOSTIC FINDINGS:  Pt had x rays and a MRI prior to surgery.     TODAY'S TREATMENT:   Therapeutic Exercise: -Reviewed current function, pain level, HEP compliance, and response to prior Rx.  -Pt performed:      Pulleys x20 each in flex, scaption, and abd    Supine flexion AROM in reclined position with head elevated 2 x 10 reps     S/L abd with head elevated 2x10    Prone extension 2x12 reps to neutral    Prone row 2x10 reps    Prone Horizontal abduction 3x10 reps    Supine shoulder ABC x 1 rep    S/L ER 3x10 reps    Standing wand flexion 2 x10 reps    Standing wand scaption 2 x 10 reps    Standing wand abd x 10 reps        Manual Therapy: Pt received gentle distraction with oscillation and Grade II posterior and inferior jt mobs to R GH joint in supine to improve pain, ROM, and to normalize arthrokinematics f/b R shoulder PROM in flexion, abduction, ER, and IR per protocol ranges.     PATIENT EDUCATION: Education details:  Correct form and mechanics with UE elevation in standing.  Updated HEP and reviewed HEP. protocol restrictions, progression of protocol, POC, HEP, and exercise form.  Person educated: Patient Education method: Explanation, Demonstration, Tactile cues, Verbal cues, and handout. Education comprehension: verbalized understanding, returned demonstration, verbal cues required, tactile cues required, and needs further education   HOME EXERCISE PROGRAM: Access Code: [email or web address] URL: [email or web address] Date: 07/17/2021 Prepared by: Ronny Flurry  Updated HEP: - Standing Shoulder Flexion AAROM with Dowel  - 2 x daily - 7 x weekly - 1 sets - 10 reps - Sidelying Shoulder External Rotation  - 1 x daily - 5 x weekly - 2-3 sets - 10 reps - Prone Shoulder Row  - 1 x daily - 6 x weekly - 2 sets - 10 reps -  Prone Single Arm Shoulder Horizontal Abduction with Scapular Retraction and Palm Down  - 1 x  daily - 5-6 x weekly - 2 sets - 10 reps    ASSESSMENT:  CLINICAL IMPRESSION: Pt is very limited with standing UE elevation and unable to lift arm well though actually has improved some.  However, he has improved with standing wand AAROM exercises.  He is able to perform standing wand AAROM with reduced shoulder hike and increased ROM.  Pt requires assistance with contralateral UE to perform elevation against gravity.  He is able to perform supine flexion AROM and S/L abduction with head elevated well through a good ROM.  Pt performs AROM exercises on table well.  Pt has good PROM t/o R shoulder.  He responded well to Rx having no pain after Rx.  He should continue to benefit from continued skilled PT services per protocol to improve ROM and to restore desired level of function.      OBJECTIVE IMPAIRMENTS decreased activity tolerance, decreased endurance, decreased ROM, decreased strength, hypomobility, impaired flexibility, and pain.   ACTIVITY LIMITATIONS carrying, lifting, bathing, dressing, reach over head, hygiene/grooming, and caring for others  PARTICIPATION LIMITATIONS: meal prep, cleaning, community activity, and yard work   [employer] POTENTIAL: Good  CLINICAL DECISION MAKING: Stable/uncomplicated  EVALUATION COMPLEXITY: Low   GOALS:  SHORT TERM GOALS:   Pt will be independent and compliant with HEP for improved ROM, pain, and function.  Baseline: Goal status: GOAL MET Target date: 08/27/2021   2.  Pt will tolerate R shoulder PROM per protocol without significant pain for improved mobility and stiffness Baseline:  Goal status: GOAL MET  Target date: 08/06/2021   3.  Pt will demo R shoulder PROM to be 140 deg in flexion and 30 deg in ER for improved mobility and stiffness.  Baseline:  Goal status: GOAL MET Target date: 08/27/2021   4.  Pt will wean out of sling per MD allowance without adverse effects.  Baseline:  Goal status: GOAL MET Target date: 08/27/2021    5.   Pt will tolerate the initiation of shoulder AAROM without adverse effects.  Baseline:  Goal status: GOAL MET Target date: 08/27/2021   6.  Pt will demo supine shoulder AAROM to be 120 deg in flexion and in 40 deg in ER for progression of AAROM and protocol Baseline:  Goal status:  GOAL MET Target date:  09/10/2021  7.  Pt will demo R shoulder flexion and scaption AROM to at least 100 deg in standing without significant shoulder hike for improved UE elevation. Baseline:  Goal status: ONGOING Target date:  10/01/2021  8.  Pt will be able to perform his self care activities with no > than minimal difficulty.   Baseline:  Goal status: ONGOING Target date:  10/08/2021   LONG TERM GOALS: Target date: 11/10/2021   (Remove Blue Hyperlink)  Pt will demo R shoulder AROM to be [facility] t/o for performance of ADLs and IADLs.  Baseline:  Goal status: ONGOING  2.  Pt will be able to perform his ADLs and IADLs without significant difficulty and pain. Baseline:  Goal status: ONGOING  3.   Pt will be able to perform his normal reaching and overhead activities without significant pain and limitation. Baseline:  Goal status: ONGOING  4.  Pt will demo at least 4/5 MMT strength t/o R shoulder for improved tolerance with and performance of daily activities, functional lifting, and performance of yard work.  Baseline:  Goal status:  ONGOING  5.  Pt will be able to perform appropriate functional carrying and lifting without significant pain in order to perform IADLs and household chores. Baseline:  Goal status: ONGOING  6.  Pt will be independent with advanced HEP for improved shoulder strength and stability to assist with returning to desired level of function and performing car work and yard work. Baseline:  Goal status: ONGOING   PLAN: PT FREQUENCY:  2 times per week  PT DURATION: other: 9 weeks  PLANNED INTERVENTIONS: Therapeutic exercises, Therapeutic activity, Neuromuscular re-education,  Patient/Family education, Joint mobilization, Aquatic Therapy, Dry Needling, Electrical stimulation, Spinal mobilization, Cryotherapy, Moist heat, Taping, Ultrasound, Manual therapy, and Re-evaluation  PLAN FOR NEXT SESSION: Cont per Dr. Eddie Dibbles Rotator Cuff Repair protocol with subscapularis restrictions.  Will use subscapularis repair protocol also.  Also has biceps tenodesis restrictions.  Progress slowly, Pt had supraspinatus, infraspinatus, and subscapularis repairs.    Selinda Michaels III PT, DPT 09/30/21 9:04 AM

## 2021-09-29 ENCOUNTER — Ambulatory Visit (HOSPITAL_BASED_OUTPATIENT_CLINIC_OR_DEPARTMENT_OTHER): Payer: Medicare PPO | Admitting: Physical Therapy

## 2021-09-29 ENCOUNTER — Encounter (HOSPITAL_BASED_OUTPATIENT_CLINIC_OR_DEPARTMENT_OTHER): Payer: Self-pay | Admitting: Physical Therapy

## 2021-09-29 DIAGNOSIS — M25611 Stiffness of right shoulder, not elsewhere classified: Secondary | ICD-10-CM | POA: Diagnosis not present

## 2021-09-29 DIAGNOSIS — M25511 Pain in right shoulder: Secondary | ICD-10-CM | POA: Diagnosis not present

## 2021-09-29 DIAGNOSIS — M6281 Muscle weakness (generalized): Secondary | ICD-10-CM

## 2021-09-30 NOTE — Therapy (Signed)
OUTPATIENT PHYSICAL THERAPY SHOULDER TREATMENT NOTE         Patient Name: Chad Manning MRN: [id number] DOB:01-19-1955, 66 y.o., male Today's Date: 10/01/2021     PT End of Session - 10/01/21 1028     Visit Number 17    Number of Visits 28    Date for PT Re-Evaluation 11/10/21    Authorization Type [employer]    PT Start Time 1024    PT Stop Time 1106    PT Time Calculation (min) 42 min    Activity Tolerance Patient tolerated treatment well;No increased pain    Behavior During Therapy WFL for tasks assessed/performed                         Past Medical History:  Diagnosis Date   Arthritis    Asthma    x 1 age 65   Hyperlipidemia    Right rotator cuff tear    Past Surgical History:  Procedure Laterality Date   COLONOSCOPY     JOINT REPLACEMENT     SHOULDER ARTHROSCOPY WITH ROTATOR CUFF REPAIR AND OPEN BICEPS TENODESIS Right 07/13/2021   Procedure: RIGHT SHOULDER ARTHROSCOPY WITH ROTATOR CUFF REPAIR AND BICEPS TENODESIS;  Surgeon: Vanetta Mulders, MD;  Location: [place];  Service: Orthopedics;  Laterality: Right;   TOTAL HIP ARTHROPLASTY     bilatera   Patient Active Problem List   Diagnosis Date Noted   Traumatic complete tear of right rotator cuff    Former smoker 12/14/2019   Acquired hallux rigidus of right foot 11/29/2018   Dyslipidemia 03/15/2018   Hyperglycemia 03/15/2018     REFERRING PROVIDER: Vanetta Mulders, MD   REFERRING DIAG: S46.011A (ICD-10-CM) - Traumatic complete tear of right rotator cuff, initial encounter   THERAPY DIAG:  Right shoulder pain, unspecified chronicity  Stiffness of right shoulder, not elsewhere classified  Muscle weakness (generalized)  Rationale for Evaluation and Treatment Rehabilitation  ONSET DATE: Injury 06/05/2021 ; DOS 07/13/2021  SUBJECTIVE:                                                                                                                                                                              SUBJECTIVE STATEMENT: Pt is 11 weeks and 3 days s/p supraspinatus, infraspinatus, and subscapularis repairs, biceps tenodesis, and subacromial decompression.   Pt denies any adverse effects after prior Rx.  Pt reports no pain currently.  Pt reports compliance with HEP and has no adverse effects with HEP.  Pt reports improved standing wand exercises with ROM and decreased shoulder hike.  Pt had improved reaching for the shampoo bottle in the shower this AM and was able to use the shampoo  bottle more easier.  He states he is able to reach further toward the back of his head.  Pt is frustrated with difficulty with reaching and elevation of R UE.  FUNCTIONAL IMPROVEMENTS:  dressing, bathing.  Able to put socks on.  Able to use it more with daily activities.  Washing hair, able to get wallet out of back pocket, threading belt FUNCTIONAL LIMITATIONS:  reaching behind head, washing hair, reaching, lifting, reaching the shampoo bottle, overhead activities.  Unable to perform yard and car work.  Limited per protocol.     PERTINENT HISTORY: R shoulder arthroscopic rotator cuff repair of supraspinatus, infraspinatus, and subscapularis, biceps tenodesis, and subacromial decompression on 07/13/2021.  MD message indicated subscapularis restrictions also.  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 No NPRS:  Current, worst, and best:  0/10 Location:  R shoulder  PRECAUTIONS: Other: RCR of supraspinatus, infraspinatus, and subscapularis.  Hx of bilat THA  WEIGHT BEARING RESTRICTIONS Yes NWB R UE    FALLS:  Has patient fallen in last 6 months? Yes. Number of falls 1, this injury  LIVING ENVIRONMENT: Lives with: lives with their spouse Lives in: 1 story home Stairs: 2 steps without rail to enter home   OCCUPATION: Pt is retired  PLOF: Independent; Pt was able to perform all of his ADLs/IADLs and reaching and overhead activities independently without limitation.   Pt was able to perform yard work and work on car without limitation.  Pt was able to pick up granddaughter.   PATIENT GOALS return to PLOF, have full ROM and strength, to be able to perform car work and yard work, pick up granddaughter      OBJECTIVE:   DIAGNOSTIC FINDINGS:  Pt had x rays and a MRI prior to surgery.     TODAY'S TREATMENT:  R Shoulder ROM: Standing AAROM with wand:  Flex: 163 Scaption: 143 deg Supine AROM:  Flex: 159 deg, ER:  85 deg Standing AROM:  flex:  85 deg  Therapeutic Exercise: -Reviewed current function, pain level, HEP compliance, and response to prior Rx. -Assessed Shoulder ROM -Pt performed:      Pulleys x20 each in flex, scaption, and abd    Supine flexion AROM in reclined position with head elevated 2 x 10 reps     S/L abd with head elevated 2x10    S/L ER 2x10 reps    Standing wand flexion 2 x10 reps    Standing wand scaption 2 x 10 reps    Standing wand abd 2 x 10 reps    Standing ladder walks in flexion x 10 reps        Manual Therapy: Pt received gentle distraction with oscillation and Grade II posterior and inferior jt mobs to R GH joint in supine to improve pain, ROM, and to normalize arthrokinematics f/b R shoulder PROM in flexion, abduction, ER, and IR per protocol ranges.     PATIENT EDUCATION: Education details:  Correct form and mechanics with UE elevation in standing.  Updated HEP and reviewed HEP. protocol restrictions, progression of protocol, POC, HEP, and exercise form.  Person educated: Patient Education method: Explanation, Demonstration, Tactile cues, Verbal cues, and handout. Education comprehension: verbalized understanding, returned demonstration, verbal cues required, tactile cues required, and needs further education   HOME EXERCISE PROGRAM: Access Code: [email or web address] URL: [email or web address] Date: 07/17/2021 Prepared by: Ronny Flurry  Updated HEP: - Standing Shoulder Flexion AAROM with Dowel  - 2  x  daily - 7 x weekly - 1 sets - 10 reps - Sidelying Shoulder External Rotation  - 1 x daily - 5 x weekly - 2-3 sets - 10 reps - Prone Shoulder Row  - 1 x daily - 6 x weekly - 2 sets - 10 reps - Prone Single Arm Shoulder Horizontal Abduction with Scapular Retraction and Palm Down  - 1 x daily - 5-6 x weekly - 2 sets - 10 reps    ASSESSMENT:  CLINICAL IMPRESSION: Pt is progressing well with function as evidenced by subjective reports of reaching shampoo bottle and reaching behind his head.  He continues to have deficits with UE elevation against gravity though is improving.  PT focused on improving AROM and AAROM against gravity.  He demonstrates improved standing AAROM against gravity and has excellent ER AROM.  Pt has compensatory shoulder hike with UE elevation and is limited with flexion AROM in standing though is making progress.  Pt responded well to Rx and had no pain after Rx.  He should continue to benefit from continued skilled PT services per protocol to address goals, improve ROM, and to restore desired level of function.        OBJECTIVE IMPAIRMENTS decreased activity tolerance, decreased endurance, decreased ROM, decreased strength, hypomobility, impaired flexibility, and pain.   ACTIVITY LIMITATIONS carrying, lifting, bathing, dressing, reach over head, hygiene/grooming, and caring for others  PARTICIPATION LIMITATIONS: meal prep, cleaning, community activity, and yard work   [employer] POTENTIAL: Good  CLINICAL DECISION MAKING: Stable/uncomplicated  EVALUATION COMPLEXITY: Low   GOALS:  SHORT TERM GOALS:   Pt will be independent and compliant with HEP for improved ROM, pain, and function.  Baseline: Goal status: GOAL MET Target date: 08/27/2021   2.  Pt will tolerate R shoulder PROM per protocol without significant pain for improved mobility and stiffness Baseline:  Goal status: GOAL MET  Target date: 08/06/2021   3.  Pt will demo R shoulder PROM to be 140 deg in  flexion and 30 deg in ER for improved mobility and stiffness.  Baseline:  Goal status: GOAL MET Target date: 08/27/2021   4.  Pt will wean out of sling per MD allowance without adverse effects.  Baseline:  Goal status: GOAL MET Target date: 08/27/2021    5.  Pt will tolerate the initiation of shoulder AAROM without adverse effects.  Baseline:  Goal status: GOAL MET Target date: 08/27/2021   6.  Pt will demo supine shoulder AAROM to be 120 deg in flexion and in 40 deg in ER for progression of AAROM and protocol Baseline:  Goal status:  GOAL MET Target date:  09/10/2021  7.  Pt will demo R shoulder flexion and scaption AROM to at least 100 deg in standing without significant shoulder hike for improved UE elevation. Baseline:  Goal status: ONGOING Target date:  10/01/2021  8.  Pt will be able to perform his self care activities with no > than minimal difficulty.   Baseline:  Goal status: ONGOING Target date:  10/08/2021   LONG TERM GOALS: Target date: 11/10/2021   (Remove Blue Hyperlink)  Pt will demo R shoulder AROM to be [facility] t/o for performance of ADLs and IADLs.  Baseline:  Goal status: ONGOING  2.  Pt will be able to perform his ADLs and IADLs without significant difficulty and pain. Baseline:  Goal status: ONGOING  3.   Pt will be able to perform his normal reaching and overhead activities without significant pain  and limitation. Baseline:  Goal status: ONGOING  4.  Pt will demo at least 4/5 MMT strength t/o R shoulder for improved tolerance with and performance of daily activities, functional lifting, and performance of yard work.  Baseline:  Goal status: ONGOING  5.  Pt will be able to perform appropriate functional carrying and lifting without significant pain in order to perform IADLs and household chores. Baseline:  Goal status: ONGOING  6.  Pt will be independent with advanced HEP for improved shoulder strength and stability to assist with returning to desired  level of function and performing car work and yard work. Baseline:  Goal status: ONGOING   PLAN: PT FREQUENCY:  2 times per week  PT DURATION: other: 9 weeks  PLANNED INTERVENTIONS: Therapeutic exercises, Therapeutic activity, Neuromuscular re-education, Patient/Family education, Joint mobilization, Aquatic Therapy, Dry Needling, Electrical stimulation, Spinal mobilization, Cryotherapy, Moist heat, Taping, Ultrasound, Manual therapy, and Re-evaluation  PLAN FOR NEXT SESSION: Cont per Dr. Eddie Dibbles Rotator Cuff Repair protocol with subscapularis restrictions.  Will use subscapularis repair protocol also.  Also has biceps tenodesis restrictions.  Progress slowly, Pt had supraspinatus, infraspinatus, and subscapularis repairs.  Will send a message to MD about performing resistance at 12 weeks.   Selinda Michaels III PT, DPT 10/01/21 2:45 PM

## 2021-10-01 ENCOUNTER — Ambulatory Visit (HOSPITAL_BASED_OUTPATIENT_CLINIC_OR_DEPARTMENT_OTHER): Payer: Medicare PPO | Admitting: Physical Therapy

## 2021-10-01 ENCOUNTER — Encounter (HOSPITAL_BASED_OUTPATIENT_CLINIC_OR_DEPARTMENT_OTHER): Payer: Self-pay | Admitting: Physical Therapy

## 2021-10-01 DIAGNOSIS — M25611 Stiffness of right shoulder, not elsewhere classified: Secondary | ICD-10-CM | POA: Diagnosis not present

## 2021-10-01 DIAGNOSIS — M6281 Muscle weakness (generalized): Secondary | ICD-10-CM

## 2021-10-01 DIAGNOSIS — M25511 Pain in right shoulder: Secondary | ICD-10-CM | POA: Diagnosis not present

## 2021-10-05 ENCOUNTER — Encounter: Payer: Self-pay | Admitting: *Deleted

## 2021-10-05 NOTE — Therapy (Signed)
OUTPATIENT PHYSICAL THERAPY SHOULDER TREATMENT NOTE         Patient Name: Chad Manning MRN: [id number] DOB:02-20-1955, 66 y.o., male Today's Date: 10/07/2021     PT End of Session - 10/06/21 1036     Visit Number 18    Number of Visits 28    Date for PT Re-Evaluation 11/10/21    Authorization Type [employer]    PT Start Time 1021    PT Stop Time 1104    PT Time Calculation (min) 43 min    Activity Tolerance Patient tolerated treatment well;No increased pain    Behavior During Therapy WFL for tasks assessed/performed                          Past Medical History:  Diagnosis Date   Arthritis    Asthma    x 1 age 41   Hyperlipidemia    Right rotator cuff tear    Past Surgical History:  Procedure Laterality Date   COLONOSCOPY     JOINT REPLACEMENT     SHOULDER ARTHROSCOPY WITH ROTATOR CUFF REPAIR AND OPEN BICEPS TENODESIS Right 07/13/2021   Procedure: RIGHT SHOULDER ARTHROSCOPY WITH ROTATOR CUFF REPAIR AND BICEPS TENODESIS;  Surgeon: Vanetta Mulders, MD;  Location: [place];  Service: Orthopedics;  Laterality: Right;   TOTAL HIP ARTHROPLASTY     bilatera   Patient Active Problem List   Diagnosis Date Noted   Traumatic complete tear of right rotator cuff    Former smoker 12/14/2019   Acquired hallux rigidus of right foot 11/29/2018   Dyslipidemia 03/15/2018   Hyperglycemia 03/15/2018     REFERRING PROVIDER: Vanetta Mulders, MD   REFERRING DIAG: S46.011A (ICD-10-CM) - Traumatic complete tear of right rotator cuff, initial encounter   THERAPY DIAG:  Right shoulder pain, unspecified chronicity  Stiffness of right shoulder, not elsewhere classified  Muscle weakness (generalized)  Rationale for Evaluation and Treatment Rehabilitation  ONSET DATE: Injury 06/05/2021 ; DOS 07/13/2021  SUBJECTIVE:                                                                                                                                                                              SUBJECTIVE STATEMENT: Pt is 12 weeks and 1 day s/p supraspinatus, infraspinatus, and subscapularis repairs, biceps tenodesis, and subacromial decompression.    PT sent MD a message concerning progressing pt to resistance exercises per protocol.  MD called PT and instructed PT to not perform resistance exercises yet.     Pt denies any adverse effects after prior Rx.  Pt reports no pain currently.  Pt reports compliance with HEP and has no adverse effects with HEP.  Pt  reports no recent change in function (except improved washing hair) but improved overall.  Pt states he is doing well functionally except lifting arm overhead.     FUNCTIONAL IMPROVEMENTS:    dressing, bathing.  Able to put socks on.  Able to use it more with daily activities.  Washing hair, able to get wallet out of back pocket, threading belt FUNCTIONAL LIMITATIONS:  reaching behind head, reaching, lifting, reaching the shampoo bottle, overhead activities.  Unable to perform yard and car work.  Limited per protocol.     PERTINENT HISTORY: R shoulder arthroscopic rotator cuff repair of supraspinatus, infraspinatus, and subscapularis, biceps tenodesis, and subacromial decompression on 07/13/2021.  MD message indicated subscapularis restrictions also.  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 No NPRS:  Current, worst, and best:  0/10 Location:  R shoulder  PRECAUTIONS: Other: RCR of supraspinatus, infraspinatus, and subscapularis.  Hx of bilat THA  WEIGHT BEARING RESTRICTIONS Yes NWB R UE    FALLS:  Has patient fallen in last 6 months? Yes. Number of falls 1, this injury  LIVING ENVIRONMENT: Lives with: lives with their spouse Lives in: 1 story home Stairs: 2 steps without rail to enter home   OCCUPATION: Pt is retired  PLOF: Independent; Pt was able to perform all of his ADLs/IADLs and reaching and overhead activities independently without limitation.  Pt was able to perform  yard work and work on car without limitation.  Pt was able to pick up granddaughter.   PATIENT GOALS return to PLOF, have full ROM and strength, to be able to perform car work and yard work, pick up granddaughter      OBJECTIVE:   DIAGNOSTIC FINDINGS:  Pt had x rays and a MRI prior to surgery.     PRIOR TREATMENT: R Shoulder ROM: Standing AAROM with wand:  Flex: 163 Scaption: 143 deg Supine AROM:  Flex: 159 deg, ER:  85 deg Standing AROM:  flex:  85 deg  TODAY'S TREATMENT:   -Pt demonstrates improved reaching behind head.    Therapeutic Exercise: -Reviewed current function, pain level, HEP compliance, and response to prior Rx. -Assessed Shoulder ROM -Pt performed:      Seated shoulder flexion AAROM with 2 finger assist from L UE    Seated on incline bench: shoulder flexion AROM with PT assistance to not allow R UE to not lower behind his side.       S/L abd with head elevated 2x10    S/L ER 3x10 reps    Standing wand flexion 2 x10 reps with cues to retract and depress scapula    Standing wand scaption 2 x 10 reps    Standing wand abd 2 x 10 reps    Standing ladder walks in flexion x 10 reps        Manual Therapy: Pt received gentle distraction with oscillation and Grade II posterior and inferior jt mobs to R GH joint in supine to improve pain, ROM, and to normalize arthrokinematics f/b R shoulder PROM in flexion, abduction, ER, and IR per protocol ranges.     PATIENT EDUCATION: Education details:  Correct form and mechanics with UE elevation in standing.  Updated HEP and reviewed HEP. protocol restrictions, progression of protocol, POC, HEP, and exercise form.  Person educated: Patient Education method: Explanation, Demonstration, Tactile cues, Verbal cues, and handout. Education comprehension: verbalized understanding, returned demonstration, verbal cues required, tactile cues required, and needs further education  HOME EXERCISE PROGRAM: Access Code: [email or web address] URL:  [email or web address] Date: 07/17/2021 Prepared by: Ronny Flurry  Updated HEP: - Standing Shoulder Flexion AAROM with Dowel  - 2 x daily - 7 x weekly - 1 sets - 10 reps - Sidelying Shoulder External Rotation  - 1 x daily - 5 x weekly - 2-3 sets - 10 reps - Prone Shoulder Row  - 1 x daily - 6 x weekly - 2 sets - 10 reps - Prone Single Arm Shoulder Horizontal Abduction with Scapular Retraction and Palm Down  - 1 x daily - 5-6 x weekly - 2 sets - 10 reps    ASSESSMENT:  CLINICAL IMPRESSION: He is limited with reaching though has improved ability to wash hair and demonstrates improved ROM with reaching behind head.  He is not having pain.  Pt has excellent ER AROM and shoulder PROM.  Pt continues to be unable to lift arm overhead actively against gravity.  He continues to have significant compensatory shoulder hike with UE elevation against gravity though is improving.  He is able to perform flexion and Abduction AROM well with good ROM with gravity minimized.  Pt is able to perform shoulder flexion AROM seated in a reclined position on incline bench well.  He is also able to perform standing AAROM well with good ROM.  Pt is highly motivated, compliant with HEP, and gives great effort in PT.  He requires cuing to control speed with exercises especially lowering UE.  Pt responded well to Rx and had no pain after Rx.  He should continue to benefit from continued skilled PT services per protocol to address goals, improve ROM, and to restore desired level of function.           OBJECTIVE IMPAIRMENTS decreased activity tolerance, decreased endurance, decreased ROM, decreased strength, hypomobility, impaired flexibility, and pain.   ACTIVITY LIMITATIONS carrying, lifting, bathing, dressing, reach over head, hygiene/grooming, and caring for others  PARTICIPATION LIMITATIONS: meal prep, cleaning, community activity, and yard work   [employer] POTENTIAL: Good  CLINICAL DECISION MAKING:  Stable/uncomplicated  EVALUATION COMPLEXITY: Low   GOALS:  SHORT TERM GOALS:   Pt will be independent and compliant with HEP for improved ROM, pain, and function.  Baseline: Goal status: GOAL MET Target date: 08/27/2021   2.  Pt will tolerate R shoulder PROM per protocol without significant pain for improved mobility and stiffness Baseline:  Goal status: GOAL MET  Target date: 08/06/2021   3.  Pt will demo R shoulder PROM to be 140 deg in flexion and 30 deg in ER for improved mobility and stiffness.  Baseline:  Goal status: GOAL MET Target date: 08/27/2021   4.  Pt will wean out of sling per MD allowance without adverse effects.  Baseline:  Goal status: GOAL MET Target date: 08/27/2021    5.  Pt will tolerate the initiation of shoulder AAROM without adverse effects.  Baseline:  Goal status: GOAL MET Target date: 08/27/2021   6.  Pt will demo supine shoulder AAROM to be 120 deg in flexion and in 40 deg in ER for progression of AAROM and protocol Baseline:  Goal status:  GOAL MET Target date:  09/10/2021  7.  Pt will demo R shoulder flexion and scaption AROM to at least 100 deg in standing without significant shoulder hike for improved UE elevation. Baseline:  Goal status: ONGOING Target date:  10/01/2021  8.  Pt will be able to perform his self care activities with no > than  minimal difficulty.   Baseline:  Goal status: ONGOING Target date:  10/08/2021   LONG TERM GOALS: Target date: 11/10/2021   (Remove Blue Hyperlink)  Pt will demo R shoulder AROM to be [facility] t/o for performance of ADLs and IADLs.  Baseline:  Goal status: ONGOING  2.  Pt will be able to perform his ADLs and IADLs without significant difficulty and pain. Baseline:  Goal status: ONGOING  3.   Pt will be able to perform his normal reaching and overhead activities without significant pain and limitation. Baseline:  Goal status: ONGOING  4.  Pt will demo at least 4/5 MMT strength t/o R shoulder  for improved tolerance with and performance of daily activities, functional lifting, and performance of yard work.  Baseline:  Goal status: ONGOING  5.  Pt will be able to perform appropriate functional carrying and lifting without significant pain in order to perform IADLs and household chores. Baseline:  Goal status: ONGOING  6.  Pt will be independent with advanced HEP for improved shoulder strength and stability to assist with returning to desired level of function and performing car work and yard work. Baseline:  Goal status: ONGOING   PLAN: PT FREQUENCY:  2 times per week  PT DURATION: other: 9 weeks  PLANNED INTERVENTIONS: Therapeutic exercises, Therapeutic activity, Neuromuscular re-education, Patient/Family education, Joint mobilization, Aquatic Therapy, Dry Needling, Electrical stimulation, Spinal mobilization, Cryotherapy, Moist heat, Taping, Ultrasound, Manual therapy, and Re-evaluation  PLAN FOR NEXT SESSION:   Don't perform resistance exercises yet per MD instruction.  Cont per Dr. Eddie Dibbles Rotator Cuff Repair protocol with subscapularis restrictions.  Will use subscapularis repair protocol also.  Also has biceps tenodesis restrictions.  Progress slowly, Pt had supraspinatus, infraspinatus, and subscapularis repairs.    Selinda Michaels III PT, DPT 10/07/21 10:30 AM

## 2021-10-06 ENCOUNTER — Encounter (HOSPITAL_BASED_OUTPATIENT_CLINIC_OR_DEPARTMENT_OTHER): Payer: Self-pay | Admitting: Physical Therapy

## 2021-10-06 ENCOUNTER — Ambulatory Visit (HOSPITAL_BASED_OUTPATIENT_CLINIC_OR_DEPARTMENT_OTHER): Payer: Medicare PPO | Admitting: Physical Therapy

## 2021-10-06 DIAGNOSIS — M25611 Stiffness of right shoulder, not elsewhere classified: Secondary | ICD-10-CM

## 2021-10-06 DIAGNOSIS — M25511 Pain in right shoulder: Secondary | ICD-10-CM

## 2021-10-06 DIAGNOSIS — M6281 Muscle weakness (generalized): Secondary | ICD-10-CM | POA: Diagnosis not present

## 2021-10-08 ENCOUNTER — Ambulatory Visit (HOSPITAL_BASED_OUTPATIENT_CLINIC_OR_DEPARTMENT_OTHER): Payer: Medicare PPO | Admitting: Physical Therapy

## 2021-10-08 ENCOUNTER — Ambulatory Visit (INDEPENDENT_AMBULATORY_CARE_PROVIDER_SITE_OTHER): Payer: Medicare PPO | Admitting: Orthopaedic Surgery

## 2021-10-08 DIAGNOSIS — M6281 Muscle weakness (generalized): Secondary | ICD-10-CM

## 2021-10-08 DIAGNOSIS — S46011A Strain of muscle(s) and tendon(s) of the rotator cuff of right shoulder, initial encounter: Secondary | ICD-10-CM

## 2021-10-08 DIAGNOSIS — M25511 Pain in right shoulder: Secondary | ICD-10-CM

## 2021-10-08 DIAGNOSIS — M25611 Stiffness of right shoulder, not elsewhere classified: Secondary | ICD-10-CM

## 2021-10-08 NOTE — Therapy (Signed)
OUTPATIENT PHYSICAL THERAPY SHOULDER TREATMENT NOTE         Patient Name: Chad Manning MRN: [id number] DOB:1955-07-03, 66 y.o., male Today's Date: 10/08/2021     PT End of Session - 10/08/21 1046     Visit Number 18    Number of Visits 28    Date for PT Re-Evaluation 11/10/21    Authorization Type [employer]    PT Start Time 42                           Past Medical History:  Diagnosis Date   Arthritis    Asthma    x 1 age 13   Hyperlipidemia    Right rotator cuff tear    Past Surgical History:  Procedure Laterality Date   COLONOSCOPY     JOINT REPLACEMENT     SHOULDER ARTHROSCOPY WITH ROTATOR CUFF REPAIR AND OPEN BICEPS TENODESIS Right 07/13/2021   Procedure: RIGHT SHOULDER ARTHROSCOPY WITH ROTATOR CUFF REPAIR AND BICEPS TENODESIS;  Surgeon: Vanetta Mulders, MD;  Location: [place];  Service: Orthopedics;  Laterality: Right;   TOTAL HIP ARTHROPLASTY     bilatera   Patient Active Problem List   Diagnosis Date Noted   Traumatic complete tear of right rotator cuff    Former smoker 12/14/2019   Acquired hallux rigidus of right foot 11/29/2018   Dyslipidemia 03/15/2018   Hyperglycemia 03/15/2018     REFERRING PROVIDER: Vanetta Mulders, MD   REFERRING DIAG: S46.011A (ICD-10-CM) - Traumatic complete tear of right rotator cuff, initial encounter   THERAPY DIAG:  Right shoulder pain, unspecified chronicity  Stiffness of right shoulder, not elsewhere classified  Muscle weakness (generalized)  Rationale for Evaluation and Treatment Rehabilitation  ONSET DATE: Injury 06/05/2021 ; DOS 07/13/2021  SUBJECTIVE:                                                                                                                                                                             SUBJECTIVE STATEMENT: Pt is 12 weeks and 3 days s/p supraspinatus, infraspinatus, and subscapularis repairs, biceps tenodesis, and subacromial decompression.   Pt saw MD right  before this PT Rx.  Pt states MD thought he may improve with an injection.  Pt received an injection.  Pt states MD spoke to him about his surgery and is pleased with his progress.   Pt reports he had some soreness though not too bad the day after prior Rx.  He denies any pain after prior Rx.      PERTINENT HISTORY: R shoulder arthroscopic rotator cuff repair of supraspinatus, infraspinatus, and subscapularis, biceps tenodesis, and subacromial decompression on 07/13/2021.  MD message indicated subscapularis restrictions  also.  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  Hx of bilat THA  WEIGHT BEARING RESTRICTIONS Yes NWB R UE    FALLS:  Has patient fallen in last 6 months? Yes. Number of falls 1, this injury  LIVING ENVIRONMENT: Lives with: lives with their spouse Lives in: 1 story home Stairs: 2 steps without rail to enter home   OCCUPATION: Pt is retired  PLOF: Independent; Pt was able to perform all of his ADLs/IADLs and reaching and overhead activities independently without limitation.  Pt was able to perform yard work and work on car without limitation.  Pt was able to pick up granddaughter.   PATIENT GOALS return to PLOF, have full ROM and strength, to be able to perform car work and yard work, pick up granddaughter      OBJECTIVE:   DIAGNOSTIC FINDINGS:  Pt had x rays and a MRI prior to surgery.     TODAY'S TREATMENT:   -PT withheld Rx today.  PT provided education concerning response to injection and appropriate exercises depending on how his shoulder is feeling.  PT answered pt's questions.          PATIENT EDUCATION: Education details:  treatment plan, HEP, sx management Person educated: Patient Education method: Explanation Education comprehension: verbalized understanding   HOME EXERCISE PROGRAM: Access Code: [email or web address] URL: [email or web address] Date:  07/17/2021 Prepared by: Ronny Flurry      ASSESSMENT:  CLINICAL IMPRESSION: Pt just received an injection prior to PT Rx.  Pt given an option to receive PROM today but will be limited in exercises due to injection.  Pt agreed with PT to withhold Rx today.  Pt understands to what extent he should perform his HEP depending on his shoulder response.       OBJECTIVE IMPAIRMENTS decreased activity tolerance, decreased endurance, decreased ROM, decreased strength, hypomobility, impaired flexibility, and pain.   ACTIVITY LIMITATIONS carrying, lifting, bathing, dressing, reach over head, hygiene/grooming, and caring for others  PARTICIPATION LIMITATIONS: meal prep, cleaning, community activity, and yard work   [employer] POTENTIAL: Good  CLINICAL DECISION MAKING: Stable/uncomplicated  EVALUATION COMPLEXITY: Low   GOALS:  SHORT TERM GOALS:   Pt will be independent and compliant with HEP for improved ROM, pain, and function.  Baseline: Goal status: GOAL MET Target date: 08/27/2021   2.  Pt will tolerate R shoulder PROM per protocol without significant pain for improved mobility and stiffness Baseline:  Goal status: GOAL MET  Target date: 08/06/2021   3.  Pt will demo R shoulder PROM to be 140 deg in flexion and 30 deg in ER for improved mobility and stiffness.  Baseline:  Goal status: GOAL MET Target date: 08/27/2021   4.  Pt will wean out of sling per MD allowance without adverse effects.  Baseline:  Goal status: GOAL MET Target date: 08/27/2021    5.  Pt will tolerate the initiation of shoulder AAROM without adverse effects.  Baseline:  Goal status: GOAL MET Target date: 08/27/2021   6.  Pt will demo supine shoulder AAROM to be 120 deg in flexion and in 40 deg in ER for progression of AAROM and protocol Baseline:  Goal status:  GOAL MET Target date:  09/10/2021  7.  Pt will demo R shoulder flexion and scaption AROM to at least 100 deg in standing without significant  shoulder hike for improved UE elevation. Baseline:  Goal status: ONGOING Target date:  10/01/2021  8.  Pt will be able to perform his self care activities with no > than minimal difficulty.   Baseline:  Goal status: ONGOING Target date:  10/08/2021   LONG TERM GOALS: Target date: 11/10/2021   (Remove Blue Hyperlink)  Pt will demo R shoulder AROM to be [facility] t/o for performance of ADLs and IADLs.  Baseline:  Goal status: ONGOING  2.  Pt will be able to perform his ADLs and IADLs without significant difficulty and pain. Baseline:  Goal status: ONGOING  3.   Pt will be able to perform his normal reaching and overhead activities without significant pain and limitation. Baseline:  Goal status: ONGOING  4.  Pt will demo at least 4/5 MMT strength t/o R shoulder for improved tolerance with and performance of daily activities, functional lifting, and performance of yard work.  Baseline:  Goal status: ONGOING  5.  Pt will be able to perform appropriate functional carrying and lifting without significant pain in order to perform IADLs and household chores. Baseline:  Goal status: ONGOING  6.  Pt will be independent with advanced HEP for improved shoulder strength and stability to assist with returning to desired level of function and performing car work and yard work. Baseline:  Goal status: ONGOING   PLAN: PT FREQUENCY:  2 times per week  PT DURATION: other: 9 weeks  PLANNED INTERVENTIONS: Therapeutic exercises, Therapeutic activity, Neuromuscular re-education, Patient/Family education, Joint mobilization, Aquatic Therapy, Dry Needling, Electrical stimulation, Spinal mobilization, Cryotherapy, Moist heat, Taping, Ultrasound, Manual therapy, and Re-evaluation  PLAN FOR NEXT SESSION:   Assess response to injection.  Don't perform resistance exercises yet per MD instruction.  Cont per Dr. Eddie Dibbles Rotator Cuff Repair protocol with subscapularis restrictions.  Will use subscapularis  repair protocol also.  Also has biceps tenodesis restrictions.  Progress slowly, Pt had supraspinatus, infraspinatus, and subscapularis repairs.    Selinda Michaels III PT, DPT 10/08/21 10:56 AM

## 2021-10-08 NOTE — Progress Notes (Signed)
Post Operative Evaluation    Procedure/Date of Surgery: Shoulder rotator cuff repair and subscapularis repair 07/13/2021  Interval History:   Presents today 12 weeks status post the above procedure.  Overall he is continuing to make progress.  At this time he is able to actively get to approximately 90 degrees and feel a catch in the shoulder.  He is working on progressing past that.  He does have some clicking about the [facility] joint.  PMH/PSH/Family History/Social History/Meds/Allergies:    Past Medical History:  Diagnosis Date   Arthritis    Asthma    x 1 age 27   Hyperlipidemia    Right rotator cuff tear    Past Surgical History:  Procedure Laterality Date   COLONOSCOPY     JOINT REPLACEMENT     SHOULDER ARTHROSCOPY WITH ROTATOR CUFF REPAIR AND OPEN BICEPS TENODESIS Right 07/13/2021   Procedure: RIGHT SHOULDER ARTHROSCOPY WITH ROTATOR CUFF REPAIR AND BICEPS TENODESIS;  Surgeon: Vanetta Mulders, MD;  Location: [place];  Service: Orthopedics;  Laterality: Right;   TOTAL HIP ARTHROPLASTY     bilatera   Social History   Socioeconomic History   Marital status: Married    Spouse name: Not on file   Number of children: 2   Years of education: Not on file   Highest education level: Not on file  Occupational History   Not on file  Tobacco Use   Smoking status: Former    Packs/day: 0.50    Years: 40.00    Total pack years: 20.00    Types: Cigarettes    Quit date: 2010    Years since quitting: 13.7   Smokeless tobacco: Never  Vaping Use   Vaping Use: Never used  Substance and Sexual Activity   Alcohol use: Yes    Alcohol/week: 5.0 - 10.0 standard drinks of alcohol    Types: 5 - 10 Cans of beer per week    Comment: Beer   Drug use: Never   Sexual activity: Yes  Other Topics Concern   Not on file  Social History Narrative   [employer]   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[employer]   Social Determinants of Health   Financial Resource Strain: Not on file   Food Insecurity: Not on file  Transportation Needs: Not on file  Physical Activity: Not on file  Stress: Not on file  Social Connections: Not on file   Family History  Problem Relation Age of Onset   Heart disease Father    Cancer Mother    Lung cancer Sister        smoker   Colon cancer Neg Hx    Prostate cancer Neg Hx    Esophageal cancer Neg Hx    Rectal cancer Neg Hx    No Known Allergies Current Outpatient Medications  Medication Sig Dispense Refill   acetaminophen (TYLENOL) 500 MG tablet Take 500 mg by mouth every 6 (six) hours as needed.     AMBULATORY NON FORMULARY MEDICATION Lift chair.  Dispense 1.  R26.81. 1 each 0   aspirin EC 325 MG tablet Take 1 tablet (325 mg total) by mouth daily. 30 tablet 0   atorvastatin (LIPITOR) 40 MG tablet TAKE 1 TABLET BY MOUTH DAILY 90 tablet 1   atorvastatin (LIPITOR) 40 MG tablet TAKE 1 TABLET(40 MG) BY MOUTH  DAILY 90 tablet 1   diclofenac Sodium (VOLTAREN) 1 % GEL Apply 1 Application topically 4 (four) times daily as needed (knee pain).     docusate sodium (COLACE) 100 MG capsule Take 200 mg by mouth daily.     ibuprofen (ADVIL) 200 MG tablet Take 600 mg by mouth every 8 (eight) hours as needed for moderate pain.     methocarbamol (ROBAXIN) 500 MG tablet Take 2 tablets by mouth every 8 hours as needed for 30 days. (Patient not taking: Reported on 07/10/2021) 180 tablet 0   oxyCODONE (OXY IR/ROXICODONE) 5 MG immediate release tablet Take 1 tablet (5 mg total) by mouth every 4 (four) hours as needed (severe pain). (Patient not taking: Reported on 07/10/2021) 20 tablet 0   No current facility-administered medications for this visit.   No results found.  Review of Systems:   A ROS was performed including pertinent positives and negatives as documented in the HPI.   Musculoskeletal Exam:    Right shoulder portals are well-healed.  In the supine position he is able to actively forward elevate with me to 170 degrees which is equal to  contralateral side.  He is also able to do this in the 45 degree position.  In the sitting position he is able to get to approximately 90 degrees where he feels a catch.  External rotation at the side is to 45 degrees compared to 60 on the contralateral side.  Internal rotation is to L3.  Compared to L1 on the contralateral.  Imaging:    None  I personally reviewed and interpreted the radiographs.   Assessment:   66 year old male who is 12 weeks status post right shoulder rotator cuff repair.  Overall he does continue to have a catch at 90 degrees.  I do believe that this is a result of a combination of muscular weakness and scapular dyskinesis.  I would like him to continue to work on scapular stabilization type exercises in order to promote a more normal scapular rhythm and to continue to work on strengthening.  He may begin working on [employer] at this time.  I have advised him specifically on a pulley to help progress his rehab  Plan :    -Return to clinic in 4 weeks     I personally saw and evaluated the patient, and participated in the management and treatment plan.  Vanetta Mulders, MD Attending Physician, Orthopedic Surgery  This document was dictated using Dragon voice recognition software. A reasonable attempt at proof reading has been made to minimize errors.

## 2021-10-12 ENCOUNTER — Other Ambulatory Visit: Payer: Self-pay | Admitting: Family Medicine

## 2021-10-12 ENCOUNTER — Other Ambulatory Visit (HOSPITAL_COMMUNITY): Payer: Self-pay

## 2021-10-12 DIAGNOSIS — E785 Hyperlipidemia, unspecified: Secondary | ICD-10-CM

## 2021-10-12 MED ORDER — ATORVASTATIN CALCIUM 40 MG PO TABS
ORAL_TABLET | ORAL | 1 refills | Status: DC
  Filled 2021-10-12: qty 90, 90d supply, fill #0
  Filled 2022-01-12: qty 90, 90d supply, fill #1

## 2021-10-12 NOTE — Therapy (Signed)
OUTPATIENT PHYSICAL THERAPY SHOULDER TREATMENT NOTE         Patient Name: Chad Manning MRN: [id number] DOB:22-May-1955, 66 y.o., male Today's Date: 10/14/2021     PT End of Session - 10/13/21 1007     Visit Number 19    Number of Visits 28    Date for PT Re-Evaluation 11/10/21    Authorization Type [employer]    PT Start Time 0945    PT Stop Time 1025    PT Time Calculation (min) 40 min    Activity Tolerance Patient tolerated treatment well;No increased pain    Behavior During Therapy WFL for tasks assessed/performed                           Past Medical History:  Diagnosis Date   Arthritis    Asthma    x 1 age 76   Hyperlipidemia    Right rotator cuff tear    Past Surgical History:  Procedure Laterality Date   COLONOSCOPY     JOINT REPLACEMENT     SHOULDER ARTHROSCOPY WITH ROTATOR CUFF REPAIR AND OPEN BICEPS TENODESIS Right 07/13/2021   Procedure: RIGHT SHOULDER ARTHROSCOPY WITH ROTATOR CUFF REPAIR AND BICEPS TENODESIS;  Surgeon: Vanetta Mulders, MD;  Location: [place];  Service: Orthopedics;  Laterality: Right;   TOTAL HIP ARTHROPLASTY     bilatera   Patient Active Problem List   Diagnosis Date Noted   Traumatic complete tear of right rotator cuff    Former smoker 12/14/2019   Acquired hallux rigidus of right foot 11/29/2018   Dyslipidemia 03/15/2018   Hyperglycemia 03/15/2018     REFERRING PROVIDER: Vanetta Mulders, MD   REFERRING DIAG: S46.011A (ICD-10-CM) - Traumatic complete tear of right rotator cuff, initial encounter   THERAPY DIAG:  Right shoulder pain, unspecified chronicity  Stiffness of right shoulder, not elsewhere classified  Muscle weakness (generalized)  Rationale for Evaluation and Treatment Rehabilitation  ONSET DATE: Injury 06/05/2021 ; DOS 07/13/2021  SUBJECTIVE:                                                                                                                                                                              SUBJECTIVE STATEMENT: Pt is 13 weeks and 1 day s/p supraspinatus, infraspinatus, and subscapularis repairs, biceps tenodesis, and subacromial decompression.     Pt received an injection from MD last week and reports no improvement in sx's.  Pt states he doesn't have any pain unless he pushes it past that point in ROM.  MD note indicates for pt to continue to work on scapular stabilization type exercises in order to promote a more normal scapular rhythm and to  continue to work on [employer].  He may begin working on [employer] at this time.  Pt also received message from MD that it is ok to begin strengthening.   FUNCTIONAL IMPROVEMENTS:    dressing, bathing.  Able to put socks on.  Able to use it more with daily activities.  Washing hair, able to get wallet out of back pocket, threading belt FUNCTIONAL LIMITATIONS:  reaching behind head, reaching, lifting, reaching the shampoo bottle, overhead activities.  Unable to perform yard and car work.  Limited per protocol.     PERTINENT HISTORY: R shoulder arthroscopic rotator cuff repair of supraspinatus, infraspinatus, and subscapularis, biceps tenodesis, and subacromial decompression on 07/13/2021.  MD message indicated subscapularis restrictions also.  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 No NPRS:  Current, worst, and best:  0/10 Location:  R shoulder  PRECAUTIONS: Other: RCR of supraspinatus, infraspinatus, and subscapularis.  Hx of bilat THA  WEIGHT BEARING RESTRICTIONS Yes NWB R UE    FALLS:  Has patient fallen in last 6 months? Yes. Number of falls 1, this injury  LIVING ENVIRONMENT: Lives with: lives with their spouse Lives in: 1 story home Stairs: 2 steps without rail to enter home   OCCUPATION: Pt is retired  PLOF: Independent; Pt was able to perform all of his ADLs/IADLs and reaching and overhead activities independently without limitation.  Pt was able to perform yard work and  work on car without limitation.  Pt was able to pick up granddaughter.   PATIENT GOALS return to PLOF, have full ROM and strength, to be able to perform car work and yard work, pick up granddaughter      OBJECTIVE:   DIAGNOSTIC FINDINGS:  Pt had x rays and a MRI prior to surgery.    TODAY'S TREATMENT:    Therapeutic Exercise: -Reviewed current function, pain level, HEP compliance, and response to prior Rx. -Assessed Shoulder ROM -Pt performed:      Supine serratus punch x20 each with 1# and 2#    Supine shoulder ABC x 1 rep with 1#    Supine shoulder flexion with 1# 2x10 reps    Seated on incline bench: seat #3 x 7 reps, seat #4 2x10 reps:  Shoulder flexion AROM with PT assistance to not allow R UE to not lower behind his side.       Prone extension 2x10 with 1#    Prone Horizontal abd 2x10 with 1#    Prone row 2x10 with 1#    Standing rows with YTB 2x10     Standing wand abd and scaption x 10 reps each        Manual Therapy: Pt received gentle distraction with oscillation and Grade II posterior and inferior jt mobs to R GH joint in supine to improve pain, ROM, and to normalize arthrokinematics f/b R shoulder PROM in flexion, abduction, ER, and IR per protocol ranges.     PATIENT EDUCATION: Education details:  Correct form and mechanics with UE elevation in standing.  Updated HEP and reviewed HEP. protocol restrictions, progression of protocol, POC, HEP, and exercise form.  Person educated: Patient Education method: Explanation, Demonstration, Tactile cues, Verbal cues, and handout. Education comprehension: verbalized understanding, returned demonstration, verbal cues required, tactile cues required, and needs further education   HOME EXERCISE PROGRAM: Access Code: [email or web address] URL: [email or web address] Date: 07/17/2021 Prepared by: Ronny Flurry  Updated HEP: - Standing Shoulder Flexion AAROM with [place]  -  2 x daily - 7 x weekly - 1 sets - 10 reps -  Sidelying Shoulder External Rotation  - 1 x daily - 5 x weekly - 2-3 sets - 10 reps - Prone Shoulder Row  - 1 x daily - 6 x weekly - 2 sets - 10 reps - Prone Single Arm Shoulder Horizontal Abduction with Scapular Retraction and Palm Down  - 1 x daily - 5-6 x weekly - 2 sets - 10 reps    ASSESSMENT:  CLINICAL IMPRESSION: Pt continues to have difficulty and limitations with UE elevation against gravity.  He is able to perform AAROM UE elevation well with good ROM.  Pt performed shoulder flexion AROM seated on incline bench to progressively work against gravity.  He had pain and difficulty with seat #3 though was able to perform with improved form and ease on seat #4.  MD note and MD message indicated pt could begin strength training with resistance.  PT progressed exercises per protocol with adding light resistance to select exercises.  Pt tolerated progression well and performed exercises well without c/o's.  Pt responded well to Rx having no pain after Rx.  He should continue to benefit from continued skilled PT services per protocol to address goals, improve ROM, and to restore desired level of function.      OBJECTIVE IMPAIRMENTS decreased activity tolerance, decreased endurance, decreased ROM, decreased strength, hypomobility, impaired flexibility, and pain.   ACTIVITY LIMITATIONS carrying, lifting, bathing, dressing, reach over head, hygiene/grooming, and caring for others  PARTICIPATION LIMITATIONS: meal prep, cleaning, community activity, and yard work   [employer] POTENTIAL: Good  CLINICAL DECISION MAKING: Stable/uncomplicated  EVALUATION COMPLEXITY: Low   GOALS:  SHORT TERM GOALS:   Pt will be independent and compliant with HEP for improved ROM, pain, and function.  Baseline: Goal status: GOAL MET Target date: 08/27/2021   2.  Pt will tolerate R shoulder PROM per protocol without significant pain for improved mobility and stiffness Baseline:  Goal status: GOAL MET  Target  date: 08/06/2021   3.  Pt will demo R shoulder PROM to be 140 deg in flexion and 30 deg in ER for improved mobility and stiffness.  Baseline:  Goal status: GOAL MET Target date: 08/27/2021   4.  Pt will wean out of sling per MD allowance without adverse effects.  Baseline:  Goal status: GOAL MET Target date: 08/27/2021    5.  Pt will tolerate the initiation of shoulder AAROM without adverse effects.  Baseline:  Goal status: GOAL MET Target date: 08/27/2021   6.  Pt will demo supine shoulder AAROM to be 120 deg in flexion and in 40 deg in ER for progression of AAROM and protocol Baseline:  Goal status:  GOAL MET Target date:  09/10/2021  7.  Pt will demo R shoulder flexion and scaption AROM to at least 100 deg in standing without significant shoulder hike for improved UE elevation. Baseline:  Goal status: ONGOING Target date:  10/01/2021  8.  Pt will be able to perform his self care activities with no > than minimal difficulty.   Baseline:  Goal status: ONGOING Target date:  10/08/2021   LONG TERM GOALS: Target date: 11/10/2021   (Remove Blue Hyperlink)  Pt will demo R shoulder AROM to be [facility] t/o for performance of ADLs and IADLs.  Baseline:  Goal status: ONGOING  2.  Pt will be able to perform his ADLs and IADLs without significant difficulty and pain. Baseline:  Goal status:  ONGOING  3.   Pt will be able to perform his normal reaching and overhead activities without significant pain and limitation. Baseline:  Goal status: ONGOING  4.  Pt will demo at least 4/5 MMT strength t/o R shoulder for improved tolerance with and performance of daily activities, functional lifting, and performance of yard work.  Baseline:  Goal status: ONGOING  5.  Pt will be able to perform appropriate functional carrying and lifting without significant pain in order to perform IADLs and household chores. Baseline:  Goal status: ONGOING  6.  Pt will be independent with advanced HEP for  improved shoulder strength and stability to assist with returning to desired level of function and performing car work and yard work. Baseline:  Goal status: ONGOING   PLAN: PT FREQUENCY:  2 times per week  PT DURATION: other: 9 weeks  PLANNED INTERVENTIONS: Therapeutic exercises, Therapeutic activity, Neuromuscular re-education, Patient/Family education, Joint mobilization, Aquatic Therapy, Dry Needling, Electrical stimulation, Spinal mobilization, Cryotherapy, Moist heat, Taping, Ultrasound, Manual therapy, and Re-evaluation  PLAN FOR NEXT SESSION:   MD note indicated pt to begin strengthening and can use weights.  PT sent a message to MD and received a message back that it is ok to begin strengthening.  Cont per Dr. Eddie Dibbles Rotator Cuff Repair protocol with subscapularis restrictions.  Will use subscapularis repair protocol also.  Also has biceps tenodesis restrictions.  Progress slowly, Pt had supraspinatus, infraspinatus, and subscapularis repairs.   PN next visit and progress HEP.  Selinda Michaels III PT, DPT 10/14/21 9:53 AM

## 2021-10-13 ENCOUNTER — Encounter (HOSPITAL_BASED_OUTPATIENT_CLINIC_OR_DEPARTMENT_OTHER): Payer: Self-pay | Admitting: Physical Therapy

## 2021-10-13 ENCOUNTER — Ambulatory Visit (HOSPITAL_BASED_OUTPATIENT_CLINIC_OR_DEPARTMENT_OTHER): Payer: Medicare PPO | Attending: Orthopaedic Surgery | Admitting: Physical Therapy

## 2021-10-13 DIAGNOSIS — M25511 Pain in right shoulder: Secondary | ICD-10-CM | POA: Insufficient documentation

## 2021-10-13 DIAGNOSIS — M25611 Stiffness of right shoulder, not elsewhere classified: Secondary | ICD-10-CM | POA: Insufficient documentation

## 2021-10-13 DIAGNOSIS — M6281 Muscle weakness (generalized): Secondary | ICD-10-CM | POA: Insufficient documentation

## 2021-10-14 ENCOUNTER — Other Ambulatory Visit (HOSPITAL_COMMUNITY): Payer: Self-pay

## 2021-10-14 MED ORDER — AMOXICILLIN 500 MG PO CAPS
ORAL_CAPSULE | ORAL | 1 refills | Status: DC
  Filled 2021-10-14: qty 16, 4d supply, fill #0
  Filled 2022-01-12: qty 16, 4d supply, fill #1

## 2021-10-15 ENCOUNTER — Ambulatory Visit (HOSPITAL_BASED_OUTPATIENT_CLINIC_OR_DEPARTMENT_OTHER): Payer: Medicare PPO | Admitting: Physical Therapy

## 2021-10-15 ENCOUNTER — Encounter (HOSPITAL_BASED_OUTPATIENT_CLINIC_OR_DEPARTMENT_OTHER): Payer: Self-pay | Admitting: Physical Therapy

## 2021-10-15 DIAGNOSIS — M25511 Pain in right shoulder: Secondary | ICD-10-CM

## 2021-10-15 DIAGNOSIS — M6281 Muscle weakness (generalized): Secondary | ICD-10-CM

## 2021-10-15 DIAGNOSIS — M25611 Stiffness of right shoulder, not elsewhere classified: Secondary | ICD-10-CM

## 2021-10-15 NOTE — Therapy (Signed)
OUTPATIENT PHYSICAL THERAPY SHOULDER TREATMENT NOTE  / PROGRESS NOTE  Progress Note Reporting Period 09/10/2021 to 10/15/2021  See note below for Objective Data and Assessment of Progress/Goals.          Patient Name: Fredrico Beedle MRN: [id number] DOB:[birth year], 66 y.o., male Today's Date: 10/15/2021     PT End of Session - 10/15/21 1025     Visit Number 20    Number of Visits 32    Date for PT Re-Evaluation 11/26/21    Authorization Type [employer]    Progress Note Due on Visit 28    PT Start Time 1022    PT Stop Time 1108    PT Time Calculation (min) 46 min    Activity Tolerance Patient tolerated treatment well;No increased pain    Behavior During Therapy WFL for tasks assessed/performed                       Past Medical History:  Diagnosis Date   Arthritis    Asthma    x 1 age 89   Hyperlipidemia    Right rotator cuff tear    Past Surgical History:  Procedure Laterality Date   COLONOSCOPY     JOINT REPLACEMENT     SHOULDER ARTHROSCOPY WITH ROTATOR CUFF REPAIR AND OPEN BICEPS TENODESIS Right 07/13/2021   Procedure: RIGHT SHOULDER ARTHROSCOPY WITH ROTATOR CUFF REPAIR AND BICEPS TENODESIS;  Surgeon: Vanetta Mulders, MD;  Location: [facility];  Service: Orthopedics;  Laterality: Right;   TOTAL HIP ARTHROPLASTY     bilatera   Patient Active Problem List   Diagnosis Date Noted   Traumatic complete tear of right rotator cuff    Former smoker 12/14/2019   Acquired hallux rigidus of right foot 11/29/2018   Dyslipidemia 03/15/2018   Hyperglycemia 03/15/2018     REFERRING PROVIDER: Vanetta Mulders, MD   REFERRING DIAG: S46.011A (ICD-10-CM) - Traumatic complete tear of right rotator cuff, initial encounter   THERAPY DIAG:  Right shoulder pain, unspecified chronicity  Stiffness of right shoulder, not elsewhere classified  Muscle weakness (generalized)  Rationale for Evaluation and Treatment Rehabilitation  ONSET DATE: Injury 06/05/2021 ; DOS  07/13/2021  SUBJECTIVE:                                                                                                                                                                             SUBJECTIVE STATEMENT: Pt is 13 weeks and 1 day s/p supraspinatus, infraspinatus, and subscapularis repairs, biceps tenodesis, and subacromial decompression.  Pt states he had to take antibiotics today and yesterday due to going the dentist.   Pt states he doesn't have any pain unless he pushes it past that point  in ROM.  He states he has always had pain in that range.  Pt reports having 2-3/10 pain in that range.   RESPONSE TO PRIOR RX:  Pt denies any adverse effects after prior Rx, just a little soreness.  He had no pain.  FUNCTIONAL IMPROVEMENTS:  reaching behind head, washing hair, reaching for shampoo.  Pt reports he is able to perform his ADLs and self care activities without difficulty and limitations. FUNCTIONAL LIMITATIONS:  reaching, lifting, reaching the shampoo bottle, overhead activities.  Unable to perform yard and car work.  Limited per protocol.     PERTINENT HISTORY: R shoulder arthroscopic rotator cuff repair of supraspinatus, infraspinatus, and subscapularis, biceps tenodesis, and subacromial decompression on 07/13/2021.  MD message indicated subscapularis restrictions also.  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 No NPRS:  Current and best:  0/10 ; Worst:  2-3/10 Location:  R shoulder  PRECAUTIONS: Other: RCR of supraspinatus, infraspinatus, and subscapularis.  Hx of bilat THA  WEIGHT BEARING RESTRICTIONS Yes NWB R UE    FALLS:  Has patient fallen in last 6 months? Yes. Number of falls 1, this injury  LIVING ENVIRONMENT: Lives with: lives with their spouse Lives in: 1 story home Stairs: 2 steps without rail to enter home   OCCUPATION: Pt is retired  PLOF: Independent; Pt was able to perform all of his ADLs/IADLs and reaching and  overhead activities independently without limitation.  Pt was able to perform yard work and work on car without limitation.  Pt was able to pick up granddaughter.   PATIENT GOALS return to PLOF, have full ROM and strength, to be able to perform car work and yard work, pick up granddaughter      OBJECTIVE:   DIAGNOSTIC FINDINGS:  Pt had x rays and a MRI prior to surgery.    TODAY'S TREATMENT:   UEFI:  67/80   Therapeutic Exercise: -Reviewed current function, pain level, HEP compliance, and response to prior Rx. -Assessed Shoulder ROM  AROM:  Flex:  100, Scaption:  76 deg, ER:  84 IR:  61 deg  PROM:  Flex:  165 deg, Abd:  158 deg, IR:  66 deg  AAROM:  Standing with cane:  Flex:  150, Scaption:  151, Abd:  132  -Pt performed:      Supine serratus punch 2x10 with 2#    Supine shoulder ABC x 1 rep with 1#    Supine shoulder flexion with 1# 2x10 reps    Seated on incline bench: seat #3 x 7 reps, seat #4 2x10 reps:  Shoulder flexion AROM with PT assistance to not allow R UE to not lower behind his side.       Prone extension x10 with 1# and 2 x 10 with 2#    Prone Horizontal abd 2x10 with 1#    Prone row 2x10 with 1#    Standing rows with RTB 2x10          Manual Therapy: Pt received Grade II posterior and inferior jt mobs to R Animas joint in supine to improve pain, ROM, and to normalize arthrokinematics f/b R shoulder PROM in flexion, abduction, ER, and IR per protocol ranges.     PATIENT EDUCATION: Education details:  Correct form and mechanics with UE elevation in standing.  Updated HEP and gave pt a HEP handout.  Educated pt with appropriate resistance and frequency.  Instructed pt to perform resisted exercises  3x/wk per protocol.  protocol restrictions, progression of protocol, POC, HEP, and exercise form.  Person educated: Patient Education method: Explanation, Demonstration, Tactile cues, Verbal cues, and handout. Education comprehension: verbalized understanding, returned  demonstration, verbal cues required, tactile cues required, and needs further education   HOME EXERCISE PROGRAM: Access Code: [email or web address] URL: [email or web address] Date: 07/17/2021 Prepared by: Ronny Flurry  Updated HEP with appropriate resistance and frequency of ex's.     ASSESSMENT:  CLINICAL IMPRESSION: Pt is progressing well with functional usage of R UE and reports he is able to perform his ADLs and self care activities without difficulty and limitations.  He is able to reach behind his head well and wash his hair.  Pt is limited with reaching and overhead activities.  He continues to have difficulty and limitations with UE elevation against gravity.  He has good ROM with AAROM against gravity and AROM with gravity minimized.  Pt continues to have compensatory shoulder hike with UE elevation AROM and AAROM.  He has improved shoulder hike with AAROM elevation.  Pt has excellent PROM.  PT just began strengthening exercises per protocol and pt tolerated progression well without c/o's.  PT updated HEP with strengthening exercises.  He has met all STG's except #7 and partially met LTG's #2.   Pt responded well to Rx having no pain after Rx.  He should continue to benefit from continued skilled PT services per protocol to address goals, improve ROM, and to restore desired level of function.      OBJECTIVE IMPAIRMENTS decreased activity tolerance, decreased endurance, decreased ROM, decreased strength, hypomobility, impaired flexibility, and pain.   ACTIVITY LIMITATIONS carrying, lifting, bathing, dressing, reach over head, hygiene/grooming, and caring for others  PARTICIPATION LIMITATIONS: meal prep, cleaning, community activity, and yard work   [employer] POTENTIAL: Good  CLINICAL DECISION MAKING: Stable/uncomplicated  EVALUATION COMPLEXITY: Low   GOALS:  SHORT TERM GOALS:   Pt will be independent and compliant with HEP for improved ROM, pain, and function.   Baseline: Goal status: GOAL MET Target date: 08/27/2021   2.  Pt will tolerate R shoulder PROM per protocol without significant pain for improved mobility and stiffness Baseline:  Goal status: GOAL MET  Target date: 08/06/2021   3.  Pt will demo R shoulder PROM to be 140 deg in flexion and 30 deg in ER for improved mobility and stiffness.  Baseline:  Goal status: GOAL MET Target date: 08/27/2021   4.  Pt will wean out of sling per MD allowance without adverse effects.  Baseline:  Goal status: GOAL MET Target date: 08/27/2021    5.  Pt will tolerate the initiation of shoulder AAROM without adverse effects.  Baseline:  Goal status: GOAL MET Target date: 08/27/2021   6.  Pt will demo supine shoulder AAROM to be 120 deg in flexion and in 40 deg in ER for progression of AAROM and protocol Baseline:  Goal status:  GOAL MET Target date:  09/10/2021  7.  Pt will demo R shoulder flexion and scaption AROM to at least 100 deg in standing without significant shoulder hike for improved UE elevation. Baseline:  Goal status: ONGOING Target date:  10/01/2021  8.  Pt will be able to perform his self care activities with no > than minimal difficulty.   Baseline:  Goal status: GOAL MET Target date:  10/08/2021   LONG TERM GOALS: Target date: 11/26/2021     Pt will demo R shoulder AROM to be [facility] t/o for performance of  ADLs and IADLs.  Baseline:  Goal status: PARTIALLY MET  2.  Pt will be able to perform his ADLs and IADLs without significant difficulty and pain. Baseline:  Goal status: PARTIALLY MET  3.   Pt will be able to perform his normal reaching and overhead activities without significant pain and limitation. Baseline:  Goal status: ONGOING  4.  Pt will demo at least 4/5 MMT strength t/o R shoulder for improved tolerance with and performance of daily activities, functional lifting, and performance of yard work.  Baseline:  Goal status: ONGOING  5.  Pt will be able to perform  appropriate functional carrying and lifting without significant pain in order to perform IADLs and household chores. Baseline:  Goal status: ONGOING  6.  Pt will be independent with advanced HEP for improved shoulder strength and stability to assist with returning to desired level of function and performing car work and yard work. Baseline:  Goal status: ONGOING   PLAN: PT FREQUENCY:  2 times per week  PT DURATION: other: 6 weeks  PLANNED INTERVENTIONS: Therapeutic exercises, Therapeutic activity, Neuromuscular re-education, Patient/Family education, Joint mobilization, Aquatic Therapy, Dry Needling, Electrical stimulation, Spinal mobilization, Cryotherapy, Moist heat, Taping, Ultrasound, Manual therapy, and Re-evaluation  PLAN FOR NEXT SESSION:   MD note indicated pt to begin strengthening and can use weights.  PT sent a message to MD and received a message back that it is ok to begin strengthening.  Cont per Dr. Eddie Dibbles Rotator Cuff Repair protocol with subscapularis restrictions.  Will use subscapularis repair protocol also.  Also has biceps tenodesis restrictions.  Progress slowly, Pt had supraspinatus, infraspinatus, and subscapularis repairs.   PN completed.     Selinda Michaels III PT, DPT 10/15/21 10:32 PM

## 2021-10-20 ENCOUNTER — Ambulatory Visit (HOSPITAL_BASED_OUTPATIENT_CLINIC_OR_DEPARTMENT_OTHER): Payer: Medicare PPO | Admitting: Physical Therapy

## 2021-10-20 ENCOUNTER — Encounter (HOSPITAL_BASED_OUTPATIENT_CLINIC_OR_DEPARTMENT_OTHER): Payer: Self-pay | Admitting: Physical Therapy

## 2021-10-20 DIAGNOSIS — M25511 Pain in right shoulder: Secondary | ICD-10-CM

## 2021-10-20 DIAGNOSIS — M25611 Stiffness of right shoulder, not elsewhere classified: Secondary | ICD-10-CM

## 2021-10-20 DIAGNOSIS — M6281 Muscle weakness (generalized): Secondary | ICD-10-CM | POA: Diagnosis not present

## 2021-10-20 NOTE — Therapy (Signed)
OUTPATIENT PHYSICAL THERAPY SHOULDER TREATMENT NOTE         Patient Name: Chad Manning MRN: [id number] DOB:08-25-1955, 66 y.o., male Today's Date: 10/20/2021     PT End of Session - 10/20/21 0847     Visit Number 21    Number of Visits 32    Date for PT Re-Evaluation 11/26/21    Authorization Type [employer]    PT Start Time 0805    PT Stop Time 0846    PT Time Calculation (min) 41 min    Activity Tolerance Patient tolerated treatment well;No increased pain    Behavior During Therapy WFL for tasks assessed/performed                        Past Medical History:  Diagnosis Date   Arthritis    Asthma    x 1 age 53   Hyperlipidemia    Right rotator cuff tear    Past Surgical History:  Procedure Laterality Date   COLONOSCOPY     JOINT REPLACEMENT     SHOULDER ARTHROSCOPY WITH ROTATOR CUFF REPAIR AND OPEN BICEPS TENODESIS Right 07/13/2021   Procedure: RIGHT SHOULDER ARTHROSCOPY WITH ROTATOR CUFF REPAIR AND BICEPS TENODESIS;  Surgeon: Vanetta Mulders, MD;  Location: [facility];  Service: Orthopedics;  Laterality: Right;   TOTAL HIP ARTHROPLASTY     bilatera   Patient Active Problem List   Diagnosis Date Noted   Traumatic complete tear of right rotator cuff    Former smoker 12/14/2019   Acquired hallux rigidus of right foot 11/29/2018   Dyslipidemia 03/15/2018   Hyperglycemia 03/15/2018     REFERRING PROVIDER: Vanetta Mulders, MD   REFERRING DIAG: S46.011A (ICD-10-CM) - Traumatic complete tear of right rotator cuff, initial encounter   THERAPY DIAG:  Right shoulder pain, unspecified chronicity  Stiffness of right shoulder, not elsewhere classified  Muscle weakness (generalized)  Rationale for Evaluation and Treatment Rehabilitation  ONSET DATE: Injury 06/05/2021 ; DOS 07/13/2021  SUBJECTIVE:                                                                                                                                                                              SUBJECTIVE STATEMENT: Pt is 14 weeks and 1 day s/p supraspinatus, infraspinatus, and subscapularis repairs, biceps tenodesis, and subacromial decompression.  Pr states he is doing better.  He is able to lift his R UE much better now.    RESPONSE TO PRIOR RX:  Pt denies any adverse effects after prior Rx.  FUNCTIONAL IMPROVEMENTS:  reaching behind head, washing hair, reaching for shampoo.  Pt reports he is able to perform his ADLs and self care activities without difficulty and  limitations. FUNCTIONAL LIMITATIONS:  reaching, lifting, reaching the shampoo bottle, overhead activities.  Unable to perform yard and car work.  Limited per protocol.     PERTINENT HISTORY: R shoulder arthroscopic rotator cuff repair of supraspinatus, infraspinatus, and subscapularis, biceps tenodesis, and subacromial decompression on 07/13/2021.  MD message indicated subscapularis restrictions also.  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 No NPRS:  Current and best:  0/10 ; Worst:  2-3/10 Location:  R shoulder  PRECAUTIONS: Other: RCR of supraspinatus, infraspinatus, and subscapularis.  Hx of bilat THA  WEIGHT BEARING RESTRICTIONS Yes NWB R UE    FALLS:  Has patient fallen in last 6 months? Yes. Number of falls 1, this injury  LIVING ENVIRONMENT: Lives with: lives with their spouse Lives in: 1 story home Stairs: 2 steps without rail to enter home   OCCUPATION: Pt is retired  PLOF: Independent; Pt was able to perform all of his ADLs/IADLs and reaching and overhead activities independently without limitation.  Pt was able to perform yard work and work on car without limitation.  Pt was able to pick up granddaughter.   PATIENT GOALS return to PLOF, have full ROM and strength, to be able to perform car work and yard work, pick up granddaughter      OBJECTIVE:   DIAGNOSTIC FINDINGS:  Pt had x rays and a MRI prior to surgery.    TODAY'S TREATMENT:    Therapeutic Exercise: -Reviewed current function, pain level, HEP compliance, and response to prior Rx. -Pt performed:     Supine serratus punch x20 with 2# and x10 with 3#     Supine shoulder ABC x 1 rep with 1#    Supine shoulder flexion with 1# 2x10 reps    Standing ER/IR with arm at side 3x10 each with YTB    Supine rhythmic stab's at 90/60/120 deg of flexion 2x30 sec each    Prone Horizontal abd 2x15 with 1#    Standing rows with RTB x15, GTB 2x10    Standing R shoulder elevation x 8 reps in front of mirror    Standing wand scaption x 10 reps    Standing wand abduction x 10 reps    S/L ER with 1# 2x10 reps          PATIENT EDUCATION: Education details:  Educated pt with appropriate resistance and frequency.  Instructed pt to perform resisted exercises 3x/wk per protocol.  progression of protocol, POC, HEP, and exercise form.  Person educated: Patient Education method: Explanation, Demonstration, Tactile cues, Verbal cues, and handout. Education comprehension: verbalized understanding, returned demonstration, verbal cues required, tactile cues required, and needs further education   HOME EXERCISE PROGRAM: Access Code: [email or web address] URL: [email or web address] Date: 07/17/2021 Prepared by: Ronny Flurry   ASSESSMENT:  CLINICAL IMPRESSION: Pt presents to Rx stating he is doing better and is able to lift R UE much better.  Pt has made progress and demonstrates improved R UE elevation against gravity.  He was able to elevate R UE overhead in standing which he has been unable to do.  PT progressed strengthening exercises and pt tolerated progression well without pain or c/o's.  He performed exercises well with cuing and instruction in correct form.  Pt responded well to Rx having on pain after Rx.  He should continue to benefit from continued skilled PT services per protocol to address goals, improve ROM, and to restore desired level of function.  OBJECTIVE  IMPAIRMENTS decreased activity tolerance, decreased endurance, decreased ROM, decreased strength, hypomobility, impaired flexibility, and pain.   ACTIVITY LIMITATIONS carrying, lifting, bathing, dressing, reach over head, hygiene/grooming, and caring for others  PARTICIPATION LIMITATIONS: meal prep, cleaning, community activity, and yard work   [employer] POTENTIAL: Good  CLINICAL DECISION MAKING: Stable/uncomplicated  EVALUATION COMPLEXITY: Low   GOALS:  SHORT TERM GOALS:   Pt will be independent and compliant with HEP for improved ROM, pain, and function.  Baseline: Goal status: GOAL MET Target date: 08/27/2021   2.  Pt will tolerate R shoulder PROM per protocol without significant pain for improved mobility and stiffness Baseline:  Goal status: GOAL MET  Target date: 08/06/2021   3.  Pt will demo R shoulder PROM to be 140 deg in flexion and 30 deg in ER for improved mobility and stiffness.  Baseline:  Goal status: GOAL MET Target date: 08/27/2021   4.  Pt will wean out of sling per MD allowance without adverse effects.  Baseline:  Goal status: GOAL MET Target date: 08/27/2021    5.  Pt will tolerate the initiation of shoulder AAROM without adverse effects.  Baseline:  Goal status: GOAL MET Target date: 08/27/2021   6.  Pt will demo supine shoulder AAROM to be 120 deg in flexion and in 40 deg in ER for progression of AAROM and protocol Baseline:  Goal status:  GOAL MET Target date:  09/10/2021  7.  Pt will demo R shoulder flexion and scaption AROM to at least 100 deg in standing without significant shoulder hike for improved UE elevation. Baseline:  Goal status: ONGOING Target date:  10/01/2021  8.  Pt will be able to perform his self care activities with no > than minimal difficulty.   Baseline:  Goal status: GOAL MET Target date:  10/08/2021   LONG TERM GOALS: Target date: 11/26/2021     Pt will demo R shoulder AROM to be [facility] t/o for performance of ADLs and  IADLs.  Baseline:  Goal status: PARTIALLY MET  2.  Pt will be able to perform his ADLs and IADLs without significant difficulty and pain. Baseline:  Goal status: PARTIALLY MET  3.   Pt will be able to perform his normal reaching and overhead activities without significant pain and limitation. Baseline:  Goal status: ONGOING  4.  Pt will demo at least 4/5 MMT strength t/o R shoulder for improved tolerance with and performance of daily activities, functional lifting, and performance of yard work.  Baseline:  Goal status: ONGOING  5.  Pt will be able to perform appropriate functional carrying and lifting without significant pain in order to perform IADLs and household chores. Baseline:  Goal status: ONGOING  6.  Pt will be independent with advanced HEP for improved shoulder strength and stability to assist with returning to desired level of function and performing car work and yard work. Baseline:  Goal status: ONGOING   PLAN: PT FREQUENCY:  2 times per week  PT DURATION: other: 6 weeks  PLANNED INTERVENTIONS: Therapeutic exercises, Therapeutic activity, Neuromuscular re-education, Patient/Family education, Joint mobilization, Aquatic Therapy, Dry Needling, Electrical stimulation, Spinal mobilization, Cryotherapy, Moist heat, Taping, Ultrasound, Manual therapy, and Re-evaluation  PLAN FOR NEXT SESSION:   MD note indicated pt to begin strengthening and can use weights.  Cont per Dr. Eddie Dibbles Rotator Cuff Repair protocol with subscapularis restrictions.  Will use subscapularis repair protocol also.  Also has biceps tenodesis restrictions.  Progress slowly, Pt had supraspinatus, infraspinatus, and  subscapularis repairs.  Advanced HEP next visit.     Selinda Michaels III PT, DPT 10/20/21 4:28 PM

## 2021-10-21 NOTE — Therapy (Signed)
OUTPATIENT PHYSICAL THERAPY SHOULDER TREATMENT NOTE         Patient Name: Chad Manning MRN: [id number] DOB:08/20/1955, 66 y.o., male Today's Date: 10/22/2021     PT End of Session - 10/22/21 0932     Visit Number 22    Number of Visits 32    Date for PT Re-Evaluation 11/26/21    Authorization Type [employer]    Progress Note Due on Visit 28    PT Start Time 0930    PT Stop Time 1016    PT Time Calculation (min) 46 min    Activity Tolerance Patient tolerated treatment well;No increased pain    Behavior During Therapy WFL for tasks assessed/performed                         Past Medical History:  Diagnosis Date   Arthritis    Asthma    x 1 age 29   Hyperlipidemia    Right rotator cuff tear    Past Surgical History:  Procedure Laterality Date   COLONOSCOPY     JOINT REPLACEMENT     SHOULDER ARTHROSCOPY WITH ROTATOR CUFF REPAIR AND OPEN BICEPS TENODESIS Right 07/13/2021   Procedure: RIGHT SHOULDER ARTHROSCOPY WITH ROTATOR CUFF REPAIR AND BICEPS TENODESIS;  Surgeon: Vanetta Mulders, MD;  Location: [facility];  Service: Orthopedics;  Laterality: Right;   TOTAL HIP ARTHROPLASTY     bilatera   Patient Active Problem List   Diagnosis Date Noted   Traumatic complete tear of right rotator cuff    Former smoker 12/14/2019   Acquired hallux rigidus of right foot 11/29/2018   Dyslipidemia 03/15/2018   Hyperglycemia 03/15/2018     REFERRING PROVIDER: Vanetta Mulders, MD   REFERRING DIAG: S46.011A (ICD-10-CM) - Traumatic complete tear of right rotator cuff, initial encounter   THERAPY DIAG:  Right shoulder pain, unspecified chronicity  Stiffness of right shoulder, not elsewhere classified  Muscle weakness (generalized)  Rationale for Evaluation and Treatment Rehabilitation  ONSET DATE: Injury 06/05/2021 ; DOS 07/13/2021  SUBJECTIVE:                                                                                                                                                                              SUBJECTIVE STATEMENT: Pt is 14 weeks and 3 days s/p supraspinatus, infraspinatus, and subscapularis repairs, biceps tenodesis, and subacromial decompression.  Pt states he is doing better.  He is able to lift his R UE much better now.  Pt able to reach his bottles of shampoo today though did seem like more effort.   RESPONSE TO PRIOR RX:  Pt denies any adverse effects after prior Rx.  FUNCTIONAL IMPROVEMENTS:  reaching behind head, washing hair, reaching for shampoo.  Pt reports he is able to perform his ADLs and self care activities without difficulty and limitations. FUNCTIONAL LIMITATIONS:  reaching, lifting, reaching the shampoo bottle, overhead activities.  Unable to perform yard and car work.  Limited per protocol.     PERTINENT HISTORY: R shoulder arthroscopic rotator cuff repair of supraspinatus, infraspinatus, and subscapularis, biceps tenodesis, and subacromial decompression on 07/13/2021.  MD message indicated subscapularis restrictions also.  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 No NPRS:  Current and best:  0/10 ; Worst:  2-3/10 Location:  R shoulder  PRECAUTIONS: Other: RCR of supraspinatus, infraspinatus, and subscapularis.  Hx of bilat THA  WEIGHT BEARING RESTRICTIONS   FALLS:  Has patient fallen in last 6 months? Yes. Number of falls 1, this injury  LIVING ENVIRONMENT: Lives with: lives with their spouse Lives in: 1 story home Stairs: 2 steps without rail to enter home   OCCUPATION: Pt is retired  PLOF: Independent; Pt was able to perform all of his ADLs/IADLs and reaching and overhead activities independently without limitation.  Pt was able to perform yard work and work on car without limitation.  Pt was able to pick up granddaughter.   PATIENT GOALS return to PLOF, have full ROM and strength, to be able to perform car work and yard work, pick up granddaughter      OBJECTIVE:    DIAGNOSTIC FINDINGS:  Pt had x rays and a MRI prior to surgery.    TODAY'S TREATMENT:   Therapeutic Exercise: -Reviewed current function, pain level, HEP compliance, and response to prior Rx. -Pt performed:     UBE x 3 mins    Supine shoulder ABC x 1 rep with 2#    Supine shoulder flexion with 1# 2x10 reps    Standing ER/IR with arm at side 3x10 each with YTB    Supine rhythmic stab's at 90/60/120 deg of flexion 2x30 sec each    Prone Horizontal abd 3x10 with 1#    Standing rows with GTB 3x10    Standing jobe's flexion 2x10 reps    Standing wand scaption 2 x 10 reps    S/L ER with 1# 3x10 reps      PT updated HEP and gave pt a HEP handout.  Educated pt in correct form and appropriate frequency.  PT instructed pt he should not have pain with HEP.    PATIENT EDUCATION: Education details:  Updated HEP and reviewed HEP.  Educated pt with appropriate resistance and frequency.  Instructed pt to perform resisted exercises 3x/wk per protocol.  progression of protocol, POC, HEP, and exercise form.  Person educated: Patient Education method: Explanation, Demonstration, Tactile cues, Verbal cues, and handout. Education comprehension: verbalized understanding, returned demonstration, verbal cues required, tactile cues required, and needs further education   HOME EXERCISE PROGRAM: Access Code: [email or web address] URL: [email or web address] Date: 07/17/2021 Prepared by: Ronny Flurry  Updated HEP: - Shoulder External Rotation with Anchored Resistance  - 3 x weekly - 3 sets - 10 reps - Shoulder Internal Rotation with Resistance  - 3 x weekly - 3 sets - 10 reps - Sidelying Shoulder External Rotation  - 1 x daily - 3 x weekly - 2-3 sets - 10 reps - Standing Shoulder Flexion to 90 Degrees  - 1 x daily - 5 x weekly - 2 sets - 10 reps   ASSESSMENT:  CLINICAL IMPRESSION: Pt is making  good progress with UE elevation.  He is able to elevate R UE in standing against gravity.  Pt is able  to perform Jobe's flexion in standing to 90 deg without significant compensatory shoulder hike.  PT progressed strengthening exercises and pt tolerated progression well without pain or c/o's.  Pt demonstrated improved form with standing ER and IR.  PT updated HEP and gave pt an advanced HEP.  Pt demonstrates good understanding of HEP.  He responded well to Rx having no pain after Rx.  He should continue to benefit from continued skilled PT services per protocol to address goals, improve ROM, and to restore desired level of function.       OBJECTIVE IMPAIRMENTS decreased activity tolerance, decreased endurance, decreased ROM, decreased strength, hypomobility, impaired flexibility, and pain.   ACTIVITY LIMITATIONS carrying, lifting, bathing, dressing, reach over head, hygiene/grooming, and caring for others  PARTICIPATION LIMITATIONS: meal prep, cleaning, community activity, and yard work   [employer] POTENTIAL: Good  CLINICAL DECISION MAKING: Stable/uncomplicated  EVALUATION COMPLEXITY: Low   GOALS:  SHORT TERM GOALS:   Pt will be independent and compliant with HEP for improved ROM, pain, and function.  Baseline: Goal status: GOAL MET Target date: 08/27/2021   2.  Pt will tolerate R shoulder PROM per protocol without significant pain for improved mobility and stiffness Baseline:  Goal status: GOAL MET  Target date: 08/06/2021   3.  Pt will demo R shoulder PROM to be 140 deg in flexion and 30 deg in ER for improved mobility and stiffness.  Baseline:  Goal status: GOAL MET Target date: 08/27/2021   4.  Pt will wean out of sling per MD allowance without adverse effects.  Baseline:  Goal status: GOAL MET Target date: 08/27/2021    5.  Pt will tolerate the initiation of shoulder AAROM without adverse effects.  Baseline:  Goal status: GOAL MET Target date: 08/27/2021   6.  Pt will demo supine shoulder AAROM to be 120 deg in flexion and in 40 deg in ER for progression of AAROM and  protocol Baseline:  Goal status:  GOAL MET Target date:  09/10/2021  7.  Pt will demo R shoulder flexion and scaption AROM to at least 100 deg in standing without significant shoulder hike for improved UE elevation. Baseline:  Goal status: ONGOING Target date:  10/01/2021  8.  Pt will be able to perform his self care activities with no > than minimal difficulty.   Baseline:  Goal status: GOAL MET Target date:  10/08/2021   LONG TERM GOALS: Target date: 11/26/2021     Pt will demo R shoulder AROM to be [facility] t/o for performance of ADLs and IADLs.  Baseline:  Goal status: PARTIALLY MET  2.  Pt will be able to perform his ADLs and IADLs without significant difficulty and pain. Baseline:  Goal status: PARTIALLY MET  3.   Pt will be able to perform his normal reaching and overhead activities without significant pain and limitation. Baseline:  Goal status: ONGOING  4.  Pt will demo at least 4/5 MMT strength t/o R shoulder for improved tolerance with and performance of daily activities, functional lifting, and performance of yard work.  Baseline:  Goal status: ONGOING  5.  Pt will be able to perform appropriate functional carrying and lifting without significant pain in order to perform IADLs and household chores. Baseline:  Goal status: ONGOING  6.  Pt will be independent with advanced HEP for improved shoulder strength and stability  to assist with returning to desired level of function and performing car work and yard work. Baseline:  Goal status: ONGOING   PLAN: PT FREQUENCY:  2 times per week  PT DURATION: other: 6 weeks  PLANNED INTERVENTIONS: Therapeutic exercises, Therapeutic activity, Neuromuscular re-education, Patient/Family education, Joint mobilization, Aquatic Therapy, Dry Needling, Electrical stimulation, Spinal mobilization, Cryotherapy, Moist heat, Taping, Ultrasound, Manual therapy, and Re-evaluation  PLAN FOR NEXT SESSION:   MD note indicated pt to begin  strengthening and can use weights.  Cont per Dr. Eddie Dibbles Rotator Cuff Repair protocol with subscapularis restrictions.  Will use subscapularis repair protocol also.  Also has biceps tenodesis restrictions.  Progress slowly, Pt had supraspinatus, infraspinatus, and subscapularis repairs.      Selinda Michaels III PT, DPT 10/22/21 10:24 AM

## 2021-10-22 ENCOUNTER — Ambulatory Visit (HOSPITAL_BASED_OUTPATIENT_CLINIC_OR_DEPARTMENT_OTHER): Payer: Medicare PPO | Admitting: Physical Therapy

## 2021-10-22 ENCOUNTER — Encounter (HOSPITAL_BASED_OUTPATIENT_CLINIC_OR_DEPARTMENT_OTHER): Payer: Self-pay | Admitting: Physical Therapy

## 2021-10-22 DIAGNOSIS — M25611 Stiffness of right shoulder, not elsewhere classified: Secondary | ICD-10-CM | POA: Diagnosis not present

## 2021-10-22 DIAGNOSIS — M6281 Muscle weakness (generalized): Secondary | ICD-10-CM

## 2021-10-22 DIAGNOSIS — M25511 Pain in right shoulder: Secondary | ICD-10-CM | POA: Diagnosis not present

## 2021-10-26 NOTE — Therapy (Signed)
OUTPATIENT PHYSICAL THERAPY SHOULDER TREATMENT NOTE         Patient Name: Chad Manning MRN: [id number] DOB:[birth year], 66 y.o., male Today's Date: 10/27/2021     PT End of Session - 10/27/21 0804     Visit Number 23    Number of Visits 32    Date for PT Re-Evaluation 11/26/21    Authorization Type [employer]    Progress Note Due on Visit 28    PT Start Time 0803    PT Stop Time 0848    PT Time Calculation (min) 45 min    Activity Tolerance Patient tolerated treatment well;No increased pain    Behavior During Therapy WFL for tasks assessed/performed                  Past Medical History:  Diagnosis Date   Arthritis    Asthma    x 1 age 69   Hyperlipidemia    Right rotator cuff tear    Past Surgical History:  Procedure Laterality Date   COLONOSCOPY     JOINT REPLACEMENT     SHOULDER ARTHROSCOPY WITH ROTATOR CUFF REPAIR AND OPEN BICEPS TENODESIS Right 07/13/2021   Procedure: RIGHT SHOULDER ARTHROSCOPY WITH ROTATOR CUFF REPAIR AND BICEPS TENODESIS;  Surgeon: Vanetta Mulders, MD;  Location: [facility];  Service: Orthopedics;  Laterality: Right;   TOTAL HIP ARTHROPLASTY     bilatera   Patient Active Problem List   Diagnosis Date Noted   Traumatic complete tear of right rotator cuff    Former smoker 12/14/2019   Acquired hallux rigidus of right foot 11/29/2018   Dyslipidemia 03/15/2018   Hyperglycemia 03/15/2018     REFERRING PROVIDER: Vanetta Mulders, MD   REFERRING DIAG: S46.011A (ICD-10-CM) - Traumatic complete tear of right rotator cuff, initial encounter   THERAPY DIAG:  Right shoulder pain, unspecified chronicity  Stiffness of right shoulder, not elsewhere classified  Muscle weakness (generalized)  Rationale for Evaluation and Treatment Rehabilitation  ONSET DATE: Injury 06/05/2021 ; DOS 07/13/2021  SUBJECTIVE:                                                                                                                                                                              SUBJECTIVE STATEMENT: Pt is 15 weeks and 1 day s/p supraspinatus, infraspinatus, and subscapularis repairs, biceps tenodesis, and subacromial decompression.  "It's better than what it was".      RESPONSE TO PRIOR RX:  Pt denies any adverse effects after prior Rx.  FUNCTIONAL IMPROVEMENTS:  Turning the steering while while driving.  Pt reports he can hold his arm a little longer than he was doing.  Pt was able to reach higher in cabinets and lower  on the car floorboards.  reaching behind head, washing hair, reaching for shampoo.  Pt reports he is able to perform his ADLs and self care activities without difficulty and limitations. FUNCTIONAL LIMITATIONS:  reaching, lifting, overhead activities.  Unable to perform yard and car work.  Limited per protocol.     PERTINENT HISTORY: R shoulder arthroscopic rotator cuff repair of supraspinatus, infraspinatus, and subscapularis, biceps tenodesis, and subacromial decompression on 07/13/2021.  MD message indicated subscapularis restrictions also.  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 No NPRS:  Current and best:  0/10 ; Worst:  2-3/10 Location:  R shoulder  PRECAUTIONS: Other: RCR of supraspinatus, infraspinatus, and subscapularis.  Hx of bilat THA  WEIGHT BEARING RESTRICTIONS   FALLS:  Has patient fallen in last 6 months? Yes. Number of falls 1, this injury  LIVING ENVIRONMENT: Lives with: lives with their spouse Lives in: 1 story home Stairs: 2 steps without rail to enter home   OCCUPATION: Pt is retired  PLOF: Independent; Pt was able to perform all of his ADLs/IADLs and reaching and overhead activities independently without limitation.  Pt was able to perform yard work and work on car without limitation.  Pt was able to pick up granddaughter.   PATIENT GOALS return to PLOF, have full ROM and strength, to be able to perform car work and yard work, pick up  granddaughter     OBJECTIVE:   DIAGNOSTIC FINDINGS:  Pt had x rays and a MRI prior to surgery.    TODAY'S TREATMENT:   Therapeutic Exercise: -Reviewed current function, pain level, HEP compliance, and response to prior Rx. -Pt performed:     UBE x 4 mins    Standing ER/IR with arm at side 3x10 each with YTB    Supine rhythmic stab's at 90/60/120 deg of flexion 2x30 sec each    Prone Horizontal abd 3x10 with 2#    Standing rows with GTB 2x10    Standing jobe's flexion 2x10 reps with 0#, 1x7-8 with 1#    S/L ER with 1# 3x10 reps    Shelf reach x 10, 8 reps  Manual Therapy: Pt received Grade II posterior and inferior jt mobs to R GH joint in supine to improve pain, ROM, and to normalize arthrokinematics f/b R shoulder PROM in flexion, abduction, ER, and IR per protocol ranges          PATIENT EDUCATION: Education details:  Updated HEP and reviewed HEP.  Educated pt with appropriate resistance and frequency.  Instructed pt to perform resisted exercises 3x/wk per protocol.  progression of protocol, POC, HEP, and exercise form.  Person educated: Patient Education method: Explanation, Demonstration, Tactile cues, Verbal cues, and handout. Education comprehension: verbalized understanding, returned demonstration, verbal cues required, tactile cues required, and needs further education   HOME EXERCISE PROGRAM: Access Code: [email or web address] URL: [email or web address] Date: 07/17/2021 Prepared by: Ronny Flurry     ASSESSMENT:  CLINICAL IMPRESSION: Pt is progressing with function as evidenced by subjective reports of improved reaching and driving.  He continues to be limited with UE elevation though reports he is able to reach higher on the shelf.  Pt has shoulder hike with UE elevation though is improving.  Pt is able to perform Jobe's flexion in standing to 90 deg.  PT attempted to add 1# weight though pt had difficulty with correct form including having increased  compensatory shoulder hike.  Pt is improving with strength as  evidenced by increased resistance and form with exercises.  Pt did well with shelf reaching though fatigued on the 2nd set and was unable to complete the set with good ROM.  Pt had has good R shoulder PROM.  He responded well to Rx having no pain after Rx.  He should continue to benefit from continued skilled PT services per protocol to address goals, improve ROM, and to restore desired level of function.       OBJECTIVE IMPAIRMENTS decreased activity tolerance, decreased endurance, decreased ROM, decreased strength, hypomobility, impaired flexibility, and pain.   ACTIVITY LIMITATIONS carrying, lifting, bathing, dressing, reach over head, hygiene/grooming, and caring for others  PARTICIPATION LIMITATIONS: meal prep, cleaning, community activity, and yard work   [employer] POTENTIAL: Good  CLINICAL DECISION MAKING: Stable/uncomplicated  EVALUATION COMPLEXITY: Low   GOALS:  SHORT TERM GOALS:   Pt will be independent and compliant with HEP for improved ROM, pain, and function.  Baseline: Goal status: GOAL MET Target date: 08/27/2021   2.  Pt will tolerate R shoulder PROM per protocol without significant pain for improved mobility and stiffness Baseline:  Goal status: GOAL MET  Target date: 08/06/2021   3.  Pt will demo R shoulder PROM to be 140 deg in flexion and 30 deg in ER for improved mobility and stiffness.  Baseline:  Goal status: GOAL MET Target date: 08/27/2021   4.  Pt will wean out of sling per MD allowance without adverse effects.  Baseline:  Goal status: GOAL MET Target date: 08/27/2021    5.  Pt will tolerate the initiation of shoulder AAROM without adverse effects.  Baseline:  Goal status: GOAL MET Target date: 08/27/2021   6.  Pt will demo supine shoulder AAROM to be 120 deg in flexion and in 40 deg in ER for progression of AAROM and protocol Baseline:  Goal status:  GOAL MET Target date:   09/10/2021  7.  Pt will demo R shoulder flexion and scaption AROM to at least 100 deg in standing without significant shoulder hike for improved UE elevation. Baseline:  Goal status: ONGOING Target date:  10/01/2021  8.  Pt will be able to perform his self care activities with no > than minimal difficulty.   Baseline:  Goal status: GOAL MET Target date:  10/08/2021   LONG TERM GOALS: Target date: 11/26/2021     Pt will demo R shoulder AROM to be [facility] t/o for performance of ADLs and IADLs.  Baseline:  Goal status: PARTIALLY MET  2.  Pt will be able to perform his ADLs and IADLs without significant difficulty and pain. Baseline:  Goal status: PARTIALLY MET  3.   Pt will be able to perform his normal reaching and overhead activities without significant pain and limitation. Baseline:  Goal status: ONGOING  4.  Pt will demo at least 4/5 MMT strength t/o R shoulder for improved tolerance with and performance of daily activities, functional lifting, and performance of yard work.  Baseline:  Goal status: ONGOING  5.  Pt will be able to perform appropriate functional carrying and lifting without significant pain in order to perform IADLs and household chores. Baseline:  Goal status: ONGOING  6.  Pt will be independent with advanced HEP for improved shoulder strength and stability to assist with returning to desired level of function and performing car work and yard work. Baseline:  Goal status: ONGOING   PLAN: PT FREQUENCY:  2 times per week  PT DURATION: other: 6 weeks  PLANNED INTERVENTIONS: Therapeutic exercises, Therapeutic activity, Neuromuscular re-education, Patient/Family education, Joint mobilization, Aquatic Therapy, Dry Needling, Electrical stimulation, Spinal mobilization, Cryotherapy, Moist heat, Taping, Ultrasound, Manual therapy, and Re-evaluation  PLAN FOR NEXT SESSION:   MD note indicated pt to begin strengthening and can use weights.  Cont per Dr. Eddie Dibbles  Rotator Cuff Repair protocol with subscapularis restrictions.  Will use subscapularis repair protocol also.  Also has biceps tenodesis restrictions.  Progress slowly, Pt had supraspinatus, infraspinatus, and subscapularis repairs.      Selinda Michaels III PT, DPT 10/27/21 2:08 PM

## 2021-10-27 ENCOUNTER — Ambulatory Visit (HOSPITAL_BASED_OUTPATIENT_CLINIC_OR_DEPARTMENT_OTHER): Payer: Medicare PPO | Admitting: Physical Therapy

## 2021-10-27 ENCOUNTER — Encounter (HOSPITAL_BASED_OUTPATIENT_CLINIC_OR_DEPARTMENT_OTHER): Payer: Self-pay | Admitting: Physical Therapy

## 2021-10-27 DIAGNOSIS — M25611 Stiffness of right shoulder, not elsewhere classified: Secondary | ICD-10-CM | POA: Diagnosis not present

## 2021-10-27 DIAGNOSIS — M25511 Pain in right shoulder: Secondary | ICD-10-CM

## 2021-10-27 DIAGNOSIS — M6281 Muscle weakness (generalized): Secondary | ICD-10-CM

## 2021-10-29 ENCOUNTER — Encounter (HOSPITAL_BASED_OUTPATIENT_CLINIC_OR_DEPARTMENT_OTHER): Payer: Medicare PPO | Admitting: Physical Therapy

## 2021-11-03 ENCOUNTER — Encounter (HOSPITAL_BASED_OUTPATIENT_CLINIC_OR_DEPARTMENT_OTHER): Payer: Medicare PPO | Admitting: Physical Therapy

## 2021-11-05 ENCOUNTER — Encounter (HOSPITAL_BASED_OUTPATIENT_CLINIC_OR_DEPARTMENT_OTHER): Payer: Self-pay | Admitting: Physical Therapy

## 2021-11-05 ENCOUNTER — Ambulatory Visit (INDEPENDENT_AMBULATORY_CARE_PROVIDER_SITE_OTHER): Payer: Medicare PPO | Admitting: Orthopaedic Surgery

## 2021-11-05 ENCOUNTER — Ambulatory Visit (HOSPITAL_BASED_OUTPATIENT_CLINIC_OR_DEPARTMENT_OTHER): Payer: Medicare PPO | Admitting: Physical Therapy

## 2021-11-05 DIAGNOSIS — S46011A Strain of muscle(s) and tendon(s) of the rotator cuff of right shoulder, initial encounter: Secondary | ICD-10-CM | POA: Diagnosis not present

## 2021-11-05 DIAGNOSIS — M25511 Pain in right shoulder: Secondary | ICD-10-CM

## 2021-11-05 DIAGNOSIS — M6281 Muscle weakness (generalized): Secondary | ICD-10-CM | POA: Diagnosis not present

## 2021-11-05 DIAGNOSIS — M25611 Stiffness of right shoulder, not elsewhere classified: Secondary | ICD-10-CM | POA: Diagnosis not present

## 2021-11-05 NOTE — Progress Notes (Signed)
Post Operative Evaluation    Procedure/Date of Surgery: Shoulder rotator cuff repair and subscapularis repair 07/13/2021  Interval History:   Presents today status post the above procedure.  Overall he is doing much better at today's visit.  His strength continues to improve.  He is having some popping about the [facility] joint although this is not painful.  PMH/PSH/Family History/Social History/Meds/Allergies:    Past Medical History:  Diagnosis Date   Arthritis    Asthma    x 1 age 89   Hyperlipidemia    Right rotator cuff tear    Past Surgical History:  Procedure Laterality Date   COLONOSCOPY     JOINT REPLACEMENT     SHOULDER ARTHROSCOPY WITH ROTATOR CUFF REPAIR AND OPEN BICEPS TENODESIS Right 07/13/2021   Procedure: RIGHT SHOULDER ARTHROSCOPY WITH ROTATOR CUFF REPAIR AND BICEPS TENODESIS;  Surgeon: Vanetta Mulders, MD;  Location: [facility];  Service: Orthopedics;  Laterality: Right;   TOTAL HIP ARTHROPLASTY     bilatera   Social History   Socioeconomic History   Marital status: Married    Spouse name: Not on file   Number of children: 2   Years of education: Not on file   Highest education level: Not on file  Occupational History   Not on file  Tobacco Use   Smoking status: Former    Packs/day: 0.50    Years: 40.00    Total pack years: 20.00    Types: Cigarettes    Quit date: 2010    Years since quitting: 13.8   Smokeless tobacco: Never  Vaping Use   Vaping Use: Never used  Substance and Sexual Activity   Alcohol use: Yes    Alcohol/week: 5.0 - 10.0 standard drinks of alcohol    Types: 5 - 10 Cans of beer per week    Comment: Beer   Drug use: Never   Sexual activity: Yes  Other Topics Concern   Not on file  Social History Narrative   [employer]   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[employer]   Social Determinants of Health   Financial Resource Strain: Not on file  Food Insecurity: Not on file  Transportation Needs: Not on file  Physical  Activity: Not on file  Stress: Not on file  Social Connections: Not on file   Family History  Problem Relation Age of Onset   Heart disease Father    Cancer Mother    Lung cancer Sister        smoker   Colon cancer Neg Hx    Prostate cancer Neg Hx    Esophageal cancer Neg Hx    Rectal cancer Neg Hx    No Known Allergies Current Outpatient Medications  Medication Sig Dispense Refill   acetaminophen (TYLENOL) 500 MG tablet Take 500 mg by mouth every 6 (six) hours as needed.     AMBULATORY NON FORMULARY MEDICATION Lift chair.  Dispense 1.  R26.81. 1 each 0   amoxicillin (AMOXIL) 500 MG capsule Take 4 capsules 1 hour prior to dental appointment. 16 capsule 1   aspirin EC 325 MG tablet Take 1 tablet (325 mg total) by mouth daily. 30 tablet 0   atorvastatin (LIPITOR) 40 MG tablet TAKE 1 TABLET(40 MG) BY MOUTH DAILY 90 tablet 1   atorvastatin (LIPITOR) 40 MG tablet TAKE 1 TABLET BY  MOUTH DAILY 90 tablet 1   diclofenac Sodium (VOLTAREN) 1 % GEL Apply 1 Application topically 4 (four) times daily as needed (knee pain).     docusate sodium (COLACE) 100 MG capsule Take 200 mg by mouth daily.     ibuprofen (ADVIL) 200 MG tablet Take 600 mg by mouth every 8 (eight) hours as needed for moderate pain.     methocarbamol (ROBAXIN) 500 MG tablet Take 2 tablets by mouth every 8 hours as needed for 30 days. (Patient not taking: Reported on 07/10/2021) 180 tablet 0   oxyCODONE (OXY IR/ROXICODONE) 5 MG immediate release tablet Take 1 tablet (5 mg total) by mouth every 4 (four) hours as needed (severe pain). (Patient not taking: Reported on 07/10/2021) 20 tablet 0   No current facility-administered medications for this visit.   No results found.  Review of Systems:   A ROS was performed including pertinent positives and negatives as documented in the HPI.   Musculoskeletal Exam:    Right shoulder portals are well-healed.  Active forward elevation in standing position is to 160 degrees bilaterally.   External rotation at the side is to 70 degrees equal to the contralateral side.  I internal rotation is to L1 compared to T12 on the contralateral side.    Imaging:    None  I personally reviewed and interpreted the radiographs.   Assessment:   66 year old male who is status post right shoulder rotator cuff repair with subscapularis repair continue to improve.  Overall I do believe that he is doing much better at today's visit.  All activity restrictions and limitations were discussed.  I will plan to see him back in 3 months for final check Plan :    -Return to clinic in 3 months     I personally saw and evaluated the patient, and participated in the management and treatment plan.  Vanetta Mulders, MD Attending Physician, Orthopedic Surgery  This document was dictated using Dragon voice recognition software. A reasonable attempt at proof reading has been made to minimize errors.

## 2021-11-05 NOTE — Therapy (Signed)
OUTPATIENT PHYSICAL THERAPY SHOULDER TREATMENT NOTE         Patient Name: Manoah Deckard MRN: [id number] DOB:[birth year], 66 y.o., male Today's Date: 11/05/2021     PT End of Session - 11/05/21 1042     Visit Number 24    Number of Visits 32    Date for PT Re-Evaluation 11/26/21    Authorization Type [employer]    PT Start Time [phone]    PT Stop Time 1022    PT Time Calculation (min) 46 min    Activity Tolerance Patient tolerated treatment well;No increased pain    Behavior During Therapy WFL for tasks assessed/performed                   Past Medical History:  Diagnosis Date   Arthritis    Asthma    x 1 age 53   Hyperlipidemia    Right rotator cuff tear    Past Surgical History:  Procedure Laterality Date   COLONOSCOPY     JOINT REPLACEMENT     SHOULDER ARTHROSCOPY WITH ROTATOR CUFF REPAIR AND OPEN BICEPS TENODESIS Right 07/13/2021   Procedure: RIGHT SHOULDER ARTHROSCOPY WITH ROTATOR CUFF REPAIR AND BICEPS TENODESIS;  Surgeon: Vanetta Mulders, MD;  Location: [facility];  Service: Orthopedics;  Laterality: Right;   TOTAL HIP ARTHROPLASTY     bilatera   Patient Active Problem List   Diagnosis Date Noted   Traumatic complete tear of right rotator cuff    Former smoker 12/14/2019   Acquired hallux rigidus of right foot 11/29/2018   Dyslipidemia 03/15/2018   Hyperglycemia 03/15/2018     REFERRING PROVIDER: Vanetta Mulders, MD   REFERRING DIAG: S46.011A (ICD-10-CM) - Traumatic complete tear of right rotator cuff, initial encounter   THERAPY DIAG:  Right shoulder pain, unspecified chronicity  Stiffness of right shoulder, not elsewhere classified  Muscle weakness (generalized)  Rationale for Evaluation and Treatment Rehabilitation  ONSET DATE: Injury 06/05/2021 ; DOS 07/13/2021  SUBJECTIVE:                                                                                                                                                                              SUBJECTIVE STATEMENT: Pt is 16 weeks and 3 days s/p supraspinatus, infraspinatus, and subscapularis repairs, biceps tenodesis, and subacromial decompression.  Pt has been traveling some recently though has been performing HEP.  Pt reports improved reaching overhead.       RESPONSE TO PRIOR RX:  Pt denies any adverse effects after prior Rx.  FUNCTIONAL IMPROVEMENTS:  Turning the steering while while driving.  Pt reports he can hold his arm a little longer than he was doing.  Pt was able to reach higher  in cabinets and lower on the car floorboards.  reaching behind head, washing hair, reaching for shampoo.  Pt reports he is able to perform his ADLs and self care activities without difficulty and limitations. FUNCTIONAL LIMITATIONS:  reaching, lifting, overhead activities.  Unable to perform yard and car work.  Limited per protocol.     PERTINENT HISTORY: R shoulder arthroscopic rotator cuff repair of supraspinatus, infraspinatus, and subscapularis, biceps tenodesis, and subacromial decompression on 07/13/2021.  MD message indicated subscapularis restrictions also.  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 No NPRS:  Current and best:  0/10 ; Worst:  2-3/10 Location:  R shoulder  PRECAUTIONS: Other: RCR of supraspinatus, infraspinatus, and subscapularis.  Hx of bilat THA  WEIGHT BEARING RESTRICTIONS   FALLS:  Has patient fallen in last 6 months? Yes. Number of falls 1, this injury  LIVING ENVIRONMENT: Lives with: lives with their spouse Lives in: 1 story home Stairs: 2 steps without rail to enter home   OCCUPATION: Pt is retired  PLOF: Independent; Pt was able to perform all of his ADLs/IADLs and reaching and overhead activities independently without limitation.  Pt was able to perform yard work and work on car without limitation.  Pt was able to pick up granddaughter.   PATIENT GOALS return to PLOF, have full ROM and strength, to be able to  perform car work and yard work, pick up granddaughter     OBJECTIVE:   DIAGNOSTIC FINDINGS:  Pt had x rays and a MRI prior to surgery.    TODAY'S TREATMENT:   Therapeutic Exercise:  R shoulder AROM:  flexion:  145 deg in standing, scaption:  82 deg  -Reviewed current function, pain level, HEP compliance, and response to prior Rx. -Assessed ROM -Pt performed:     UBE x 5 mins    Supine flexion with 2# 2x10    Standing ER/IR with arm at side:  ER x 10 reps with YTB and 2x10 with RTB ; IR 3x10 with RTB    Standing jobe's flexion x10 reps with 0#, 1x8 with 1#    Shelf reach x 10, 8 reps   -See below for pt education    Neuro Re-ed Activities:    Supine rhythmic stab's at 90/60/120 deg of flexion 2x30 sec each    Supine D2 flexion AROM x 10 reps and with rhythmic stab's 2x10    4D ball rolls on table x 10 each and on wall x 10 each         PATIENT EDUCATION: Education details:  HEP.  Educated pt with appropriate resistance and frequency.  PT answered pt's questions.  progression of protocol, POC, and exercise form.  Person educated: Patient Education method: Explanation, Demonstration, Tactile cues, Verbal cues, and handout. Education comprehension: verbalized understanding, returned demonstration, verbal cues required, tactile cues required, and needs further education   HOME EXERCISE PROGRAM: Access Code: [email or web address] URL: [email or web address] Date: 07/17/2021 Prepared by: Ronny Flurry     ASSESSMENT:  CLINICAL IMPRESSION: Pt demonstrates much improved flexion AROM in standing and reaching overhead against gravity.  He has significant compensatory shoulder hike and limited AROM with scaption in standing.  He does require cuing to control UE and not use momentum with elevation.  PT tried Jobe's flexion with 1#.  Pt did have improved form and ability to perform though quickly fatigued and form worsened.  PT had pt stop standing flexion with resistance.  Pt  performed supine flexion with resistance well.  Pt is improving with R shoulder strength as evidenced by performance of exercises.  He was able to perform ER and IR with increased theraband resistance today without c/o's.  Pt also fatigued with shelf reach and required cuing to focus on form not range as his form worsened toward the end of the set.  Pt instructed to stop the exercise if he could not demo correct form.  Pt responded well to Rx having no pain after Rx.  He should benefit from cont skilled PT services to address impairments and goals and to restore desired level of function.        OBJECTIVE IMPAIRMENTS decreased activity tolerance, decreased endurance, decreased ROM, decreased strength, hypomobility, impaired flexibility, and pain.   ACTIVITY LIMITATIONS carrying, lifting, bathing, dressing, reach over head, hygiene/grooming, and caring for others  PARTICIPATION LIMITATIONS: meal prep, cleaning, community activity, and yard work   [employer] POTENTIAL: Good  CLINICAL DECISION MAKING: Stable/uncomplicated  EVALUATION COMPLEXITY: Low   GOALS:  SHORT TERM GOALS:   Pt will be independent and compliant with HEP for improved ROM, pain, and function.  Baseline: Goal status: GOAL MET Target date: 08/27/2021   2.  Pt will tolerate R shoulder PROM per protocol without significant pain for improved mobility and stiffness Baseline:  Goal status: GOAL MET  Target date: 08/06/2021   3.  Pt will demo R shoulder PROM to be 140 deg in flexion and 30 deg in ER for improved mobility and stiffness.  Baseline:  Goal status: GOAL MET Target date: 08/27/2021   4.  Pt will wean out of sling per MD allowance without adverse effects.  Baseline:  Goal status: GOAL MET Target date: 08/27/2021    5.  Pt will tolerate the initiation of shoulder AAROM without adverse effects.  Baseline:  Goal status: GOAL MET Target date: 08/27/2021   6.  Pt will demo supine shoulder AAROM to be 120 deg in  flexion and in 40 deg in ER for progression of AAROM and protocol Baseline:  Goal status:  GOAL MET Target date:  09/10/2021  7.  Pt will demo R shoulder flexion and scaption AROM to at least 100 deg in standing without significant shoulder hike for improved UE elevation. Baseline:  Goal status: ONGOING Target date:  10/01/2021  8.  Pt will be able to perform his self care activities with no > than minimal difficulty.   Baseline:  Goal status: GOAL MET Target date:  10/08/2021   LONG TERM GOALS: Target date: 11/26/2021     Pt will demo R shoulder AROM to be [facility] t/o for performance of ADLs and IADLs.  Baseline:  Goal status: PARTIALLY MET  2.  Pt will be able to perform his ADLs and IADLs without significant difficulty and pain. Baseline:  Goal status: PARTIALLY MET  3.   Pt will be able to perform his normal reaching and overhead activities without significant pain and limitation. Baseline:  Goal status: ONGOING  4.  Pt will demo at least 4/5 MMT strength t/o R shoulder for improved tolerance with and performance of daily activities, functional lifting, and performance of yard work.  Baseline:  Goal status: ONGOING  5.  Pt will be able to perform appropriate functional carrying and lifting without significant pain in order to perform IADLs and household chores. Baseline:  Goal status: ONGOING  6.  Pt will be independent with advanced HEP for improved shoulder strength and stability to assist with returning  to desired level of function and performing car work and yard work. Baseline:  Goal status: ONGOING   PLAN: PT FREQUENCY:  2 times per week  PT DURATION: other: 6 weeks  PLANNED INTERVENTIONS: Therapeutic exercises, Therapeutic activity, Neuromuscular re-education, Patient/Family education, Joint mobilization, Aquatic Therapy, Dry Needling, Electrical stimulation, Spinal mobilization, Cryotherapy, Moist heat, Taping, Ultrasound, Manual therapy, and  Re-evaluation  PLAN FOR NEXT SESSION:   Cont per Dr. Eddie Dibbles Rotator Cuff Repair protocol.  Will use subscapularis repair protocol also.  Also has biceps tenodesis restrictions.  Progress slowly, Pt had supraspinatus, infraspinatus, and subscapularis repairs.      Selinda Michaels III PT, DPT 11/05/21 4:52 PM

## 2021-11-10 ENCOUNTER — Ambulatory Visit (HOSPITAL_BASED_OUTPATIENT_CLINIC_OR_DEPARTMENT_OTHER): Payer: Medicare PPO | Admitting: Physical Therapy

## 2021-11-10 ENCOUNTER — Encounter (HOSPITAL_BASED_OUTPATIENT_CLINIC_OR_DEPARTMENT_OTHER): Payer: Self-pay | Admitting: Physical Therapy

## 2021-11-10 DIAGNOSIS — M25511 Pain in right shoulder: Secondary | ICD-10-CM

## 2021-11-10 DIAGNOSIS — M6281 Muscle weakness (generalized): Secondary | ICD-10-CM

## 2021-11-10 DIAGNOSIS — M25611 Stiffness of right shoulder, not elsewhere classified: Secondary | ICD-10-CM

## 2021-11-10 NOTE — Therapy (Signed)
OUTPATIENT PHYSICAL THERAPY SHOULDER TREATMENT NOTE         Patient Name: Chad Manning MRN: [id number] DOB:21-Nov-1955, 66 y.o., male Today's Date: 11/11/2021     PT End of Session - 11/10/21 0945     Visit Number 25    Number of Visits 32    Date for PT Re-Evaluation 11/26/21    Authorization Type [employer]    Progress Note Due on Visit 28    PT Start Time [phone]    PT Stop Time 1019    PT Time Calculation (min) 41 min    Activity Tolerance Patient tolerated treatment well;No increased pain    Behavior During Therapy WFL for tasks assessed/performed                   Past Medical History:  Diagnosis Date   Arthritis    Asthma    x 1 age 26   Hyperlipidemia    Right rotator cuff tear    Past Surgical History:  Procedure Laterality Date   COLONOSCOPY     JOINT REPLACEMENT     SHOULDER ARTHROSCOPY WITH ROTATOR CUFF REPAIR AND OPEN BICEPS TENODESIS Right 07/13/2021   Procedure: RIGHT SHOULDER ARTHROSCOPY WITH ROTATOR CUFF REPAIR AND BICEPS TENODESIS;  Surgeon: Vanetta Mulders, MD;  Location: [place];  Service: Orthopedics;  Laterality: Right;   TOTAL HIP ARTHROPLASTY     bilatera   Patient Active Problem List   Diagnosis Date Noted   Traumatic complete tear of right rotator cuff    Former smoker 12/14/2019   Acquired hallux rigidus of right foot 11/29/2018   Dyslipidemia 03/15/2018   Hyperglycemia 03/15/2018     REFERRING PROVIDER: Vanetta Mulders, MD   REFERRING DIAG: S46.011A (ICD-10-CM) - Traumatic complete tear of right rotator cuff, initial encounter   THERAPY DIAG:  Right shoulder pain, unspecified chronicity  Stiffness of right shoulder, not elsewhere classified  Muscle weakness (generalized)  Rationale for Evaluation and Treatment Rehabilitation  ONSET DATE: Injury 06/05/2021 ; DOS 07/13/2021  SUBJECTIVE:                                                                                                                                                                              SUBJECTIVE STATEMENT: Pt is 17 weeks and 1 day s/p supraspinatus, infraspinatus, and subscapularis repairs, biceps tenodesis, and subacromial decompression.  Pt reports improved reaching overhead though continues to be limited with UE elevation.  He states his arm gets fatigued quickly with elevation.  He still has much difficulty actively reaching laterally though has good ROM with assistance.      RESPONSE TO PRIOR RX:  Pt denies any adverse effects after prior Rx.  FUNCTIONAL IMPROVEMENTS:  Turning the steering while while driving.  Pt reports he can hold his arm a little longer than he was doing.  Pt was able to reach higher in cabinets and lower on the car floorboards.  reaching behind head, washing hair, reaching for shampoo.  Pt reports he is able to perform his ADLs and self care activities without difficulty and limitations. FUNCTIONAL LIMITATIONS:  reaching, lifting, overhead activities.  Unable to perform yard and car work.  Limited per protocol.     PERTINENT HISTORY: R shoulder arthroscopic rotator cuff repair of supraspinatus, infraspinatus, and subscapularis, biceps tenodesis, and subacromial decompression on 07/13/2021.  MD message indicated subscapularis restrictions also.  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 No NPRS:  Current and best:  0/10 ; Worst:  2-3/10 Location:  R shoulder  PRECAUTIONS: Other: RCR of supraspinatus, infraspinatus, and subscapularis.  Hx of bilat THA  WEIGHT BEARING RESTRICTIONS   FALLS:  Has patient fallen in last 6 months? Yes. Number of falls 1, this injury  LIVING ENVIRONMENT: Lives with: lives with their spouse Lives in: 1 story home Stairs: 2 steps without rail to enter home   OCCUPATION: Pt is retired  PLOF: Independent; Pt was able to perform all of his ADLs/IADLs and reaching and overhead activities independently without limitation.  Pt was able to perform yard  work and work on car without limitation.  Pt was able to pick up granddaughter.   PATIENT GOALS return to PLOF, have full ROM and strength, to be able to perform car work and yard work, pick up granddaughter     OBJECTIVE:   DIAGNOSTIC FINDINGS:  Pt had x rays and a MRI prior to surgery.    TODAY'S TREATMENT:   Therapeutic Exercise:  R shoulder AROM:  flexion:  145 deg in standing, scaption:  82 deg  -Reviewed current function, pain level, HEP compliance, and response to prior Rx.  -Pt performed:     UBE x 4 mins    Supine flexion with 2# 3x10    S/L ER 1#x 15, 2# 2x10    Standing ER/IR with arm at side:  ER 3x10 with RTB ; IR 3x10 with RTB    Standing jobe's flexion x10 reps 0#, 1x10 with 1#    Standing scaption with PT assistance for concentric motion and pt performed eccentric motion 2x10    Shelf reach        Neuro Re-ed Activities:    Supine rhythmic stab's at 90/60/120 deg of flexion 2x30 sec each    Supine ER/IR rhythmic stab's at 45/60 deg abd 2x30 sec each and at 90 deg abd x 30 sec    Supine D2 flexion with rhythmic stab's 2x10    4D ball rolls on wall 2 x 10 each         PATIENT EDUCATION: Education details:  HEP.  Educated pt with appropriate resistance and frequency.  PT answered pt's questions.  progression of protocol, POC, and exercise form.  Person educated: Patient Education method: Explanation, Demonstration, Tactile cues, Verbal cues, and handout. Education comprehension: verbalized understanding, returned demonstration, verbal cues required, tactile cues required, and needs further education   HOME EXERCISE PROGRAM: Access Code: [email or web address] URL: [email or web address] Date: 07/17/2021 Prepared by: Ronny Flurry     ASSESSMENT:  CLINICAL IMPRESSION: Pt is improving with shoulder strength as evidenced by performance of exercises including against increased resistance.  He demonstrates improved form and control with standing  jobe's  flexion.  Pt able to perform a complete set of jobe's flexion with 1# with improved form and control.  Pt is still unable to perform standing scaption AROM with correct form.  He has significant difficulty including compensatory shoulder hike with standing scaption and fatigues quickly.  He became fatigued with shelf reach also and required cuing to focus on form and control.  Pt tolerated exercises well and had no pain during and after Rx.  He should benefit from cont skilled PT services to address impairments and goals and to restore desired level of function.            OBJECTIVE IMPAIRMENTS decreased activity tolerance, decreased endurance, decreased ROM, decreased strength, hypomobility, impaired flexibility, and pain.   ACTIVITY LIMITATIONS carrying, lifting, bathing, dressing, reach over head, hygiene/grooming, and caring for others  PARTICIPATION LIMITATIONS: meal prep, cleaning, community activity, and yard work   [employer] POTENTIAL: Good  CLINICAL DECISION MAKING: Stable/uncomplicated  EVALUATION COMPLEXITY: Low   GOALS:  SHORT TERM GOALS:   Pt will be independent and compliant with HEP for improved ROM, pain, and function.  Baseline: Goal status: GOAL MET Target date: 08/27/2021   2.  Pt will tolerate R shoulder PROM per protocol without significant pain for improved mobility and stiffness Baseline:  Goal status: GOAL MET  Target date: 08/06/2021   3.  Pt will demo R shoulder PROM to be 140 deg in flexion and 30 deg in ER for improved mobility and stiffness.  Baseline:  Goal status: GOAL MET Target date: 08/27/2021   4.  Pt will wean out of sling per MD allowance without adverse effects.  Baseline:  Goal status: GOAL MET Target date: 08/27/2021    5.  Pt will tolerate the initiation of shoulder AAROM without adverse effects.  Baseline:  Goal status: GOAL MET Target date: 08/27/2021   6.  Pt will demo supine shoulder AAROM to be 120 deg in flexion and in 40 deg in  ER for progression of AAROM and protocol Baseline:  Goal status:  GOAL MET Target date:  09/10/2021  7.  Pt will demo R shoulder flexion and scaption AROM to at least 100 deg in standing without significant shoulder hike for improved UE elevation. Baseline:  Goal status: ONGOING Target date:  10/01/2021  8.  Pt will be able to perform his self care activities with no > than minimal difficulty.   Baseline:  Goal status: GOAL MET Target date:  10/08/2021   LONG TERM GOALS: Target date: 11/26/2021     Pt will demo R shoulder AROM to be [facility] t/o for performance of ADLs and IADLs.  Baseline:  Goal status: PARTIALLY MET  2.  Pt will be able to perform his ADLs and IADLs without significant difficulty and pain. Baseline:  Goal status: PARTIALLY MET  3.   Pt will be able to perform his normal reaching and overhead activities without significant pain and limitation. Baseline:  Goal status: ONGOING  4.  Pt will demo at least 4/5 MMT strength t/o R shoulder for improved tolerance with and performance of daily activities, functional lifting, and performance of yard work.  Baseline:  Goal status: ONGOING  5.  Pt will be able to perform appropriate functional carrying and lifting without significant pain in order to perform IADLs and household chores. Baseline:  Goal status: ONGOING  6.  Pt will be independent with advanced HEP for improved shoulder strength and stability to assist with returning to desired level of  function and performing car work and yard work. Baseline:  Goal status: ONGOING   PLAN: PT FREQUENCY:  2 times per week  PT DURATION: other: 6 weeks  PLANNED INTERVENTIONS: Therapeutic exercises, Therapeutic activity, Neuromuscular re-education, Patient/Family education, Joint mobilization, Aquatic Therapy, Dry Needling, Electrical stimulation, Spinal mobilization, Cryotherapy, Moist heat, Taping, Ultrasound, Manual therapy, and Re-evaluation  PLAN FOR NEXT SESSION:    Cont per Dr. Eddie Dibbles Rotator Cuff Repair protocol.  Will use subscapularis repair protocol also.  Also has biceps tenodesis restrictions.  Progress slowly, Pt had supraspinatus, infraspinatus, and subscapularis repairs.      Selinda Michaels III PT, DPT 11/11/21 4:31 PM

## 2021-11-12 ENCOUNTER — Encounter (HOSPITAL_BASED_OUTPATIENT_CLINIC_OR_DEPARTMENT_OTHER): Payer: Self-pay | Admitting: Physical Therapy

## 2021-11-12 ENCOUNTER — Ambulatory Visit (HOSPITAL_BASED_OUTPATIENT_CLINIC_OR_DEPARTMENT_OTHER): Payer: Medicare PPO | Attending: Orthopaedic Surgery | Admitting: Physical Therapy

## 2021-11-12 DIAGNOSIS — M25611 Stiffness of right shoulder, not elsewhere classified: Secondary | ICD-10-CM | POA: Insufficient documentation

## 2021-11-12 DIAGNOSIS — M6281 Muscle weakness (generalized): Secondary | ICD-10-CM | POA: Diagnosis not present

## 2021-11-12 DIAGNOSIS — M25511 Pain in right shoulder: Secondary | ICD-10-CM | POA: Diagnosis not present

## 2021-11-12 NOTE — Therapy (Signed)
OUTPATIENT PHYSICAL THERAPY SHOULDER TREATMENT NOTE         Patient Name: Chad Manning MRN: [id number] DOB:17-Sep-1955, 66 y.o., male Today's Date: 11/12/2021     PT End of Session - 11/12/21 0859     Visit Number 26    Number of Visits 32    Date for PT Re-Evaluation 11/26/21    Authorization Type [employer]    PT Start Time 0850    PT Stop Time 0930    PT Time Calculation (min) 40 min    Activity Tolerance Patient tolerated treatment well;No increased pain    Behavior During Therapy WFL for tasks assessed/performed                   Past Medical History:  Diagnosis Date   Arthritis    Asthma    x 1 age 3   Hyperlipidemia    Right rotator cuff tear    Past Surgical History:  Procedure Laterality Date   COLONOSCOPY     JOINT REPLACEMENT     SHOULDER ARTHROSCOPY WITH ROTATOR CUFF REPAIR AND OPEN BICEPS TENODESIS Right 07/13/2021   Procedure: RIGHT SHOULDER ARTHROSCOPY WITH ROTATOR CUFF REPAIR AND BICEPS TENODESIS;  Surgeon: Vanetta Mulders, MD;  Location: [place];  Service: Orthopedics;  Laterality: Right;   TOTAL HIP ARTHROPLASTY     bilatera   Patient Active Problem List   Diagnosis Date Noted   Traumatic complete tear of right rotator cuff    Former smoker 12/14/2019   Acquired hallux rigidus of right foot 11/29/2018   Dyslipidemia 03/15/2018   Hyperglycemia 03/15/2018     REFERRING PROVIDER: Vanetta Mulders, MD   REFERRING DIAG: S46.011A (ICD-10-CM) - Traumatic complete tear of right rotator cuff, initial encounter   THERAPY DIAG:  Right shoulder pain, unspecified chronicity  Stiffness of right shoulder, not elsewhere classified  Muscle weakness (generalized)  Rationale for Evaluation and Treatment Rehabilitation  ONSET DATE: Injury 06/05/2021 ; DOS 07/13/2021  SUBJECTIVE:                                                                                                                                                                              SUBJECTIVE STATEMENT: Pt is 17 weeks and 3 days s/p supraspinatus, infraspinatus, and subscapularis repairs, biceps tenodesis, and subacromial decompression.  He states he is improving with reaching overhead though his UE fatigues quickly with elevation.  He still has much difficulty actively reaching laterally though has good ROM with assistance.  Pt denies any recent functional improvements.  Pt states he felt fine after prior Rx having no adverse effects.     PERTINENT HISTORY: R shoulder arthroscopic rotator cuff repair of supraspinatus, infraspinatus, and subscapularis, biceps  tenodesis, and subacromial decompression on 07/13/2021.  MD message indicated subscapularis restrictions also.  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 No NPRS:  Current and best:  0/10 ; Worst:  2-3/10 Location:  R shoulder  PRECAUTIONS: Other: RCR of supraspinatus, infraspinatus, and subscapularis.  Hx of bilat THA  WEIGHT BEARING RESTRICTIONS   FALLS:  Has patient fallen in last 6 months? Yes. Number of falls 1, this injury  LIVING ENVIRONMENT: Lives with: lives with their spouse Lives in: 1 story home Stairs: 2 steps without rail to enter home   OCCUPATION: Pt is retired  PLOF: Independent; Pt was able to perform all of his ADLs/IADLs and reaching and overhead activities independently without limitation.  Pt was able to perform yard work and work on car without limitation.  Pt was able to pick up granddaughter.   PATIENT GOALS return to PLOF, have full ROM and strength, to be able to perform car work and yard work, pick up granddaughter     OBJECTIVE:   DIAGNOSTIC FINDINGS:  Pt had x rays and a MRI prior to surgery.    TODAY'S TREATMENT:   Therapeutic Exercise:  R shoulder AROM:  flexion:  145 deg in standing, scaption:  82 deg  -Reviewed current function, pain level, HEP compliance, and response to prior Rx.  -Pt performed:     UBE x 5  mins    S/L ER 2# x15, 3# 2 x 10    S/L scaption with 1# x10, 2# 2x10    Standing jobe's flexion 3x10 with 1#    Standing scaption with PT assistance for concentric motion and pt performed eccentric motion 2x10    Shelf reach  3x10      Neuro Re-ed Activities:    Supine rhythmic stab's at 90/60/120 deg of flexion 2x30 sec each, (1# at 60 and 90 deg)    Supine ER/IR rhythmic stab's at 45/60/90 deg abd 2x30 sec each     Supine D2 flexion with 1# 2x10    4D ball rolls on wall 2 x 10 each         PATIENT EDUCATION: Education details:  HEP.  Educated pt with appropriate resistance and frequency.  PT answered pt's questions.  progression of protocol, POC, and exercise form.  Person educated: Patient Education method: Explanation, Demonstration, Tactile cues, Verbal cues, and handout. Education comprehension: verbalized understanding, returned demonstration, verbal cues required, tactile cues required, and needs further education   HOME EXERCISE PROGRAM: Access Code: [email or web address] URL: [email or web address] Date: 07/17/2021 Prepared by: Ronny Flurry     ASSESSMENT:  CLINICAL IMPRESSION: Pt is improving with shoulder strength as evidenced by performance of exercises including against increased resistance.  He is improving with strength as evidenced by being able to perform standing jobe's flexion 3 sets with 1#, S/L scaption with 1#, and S/L ER with 3#.  He has improved form and control with elevation though does still require cuing to not use momentum and increase focus on form.  Pt is still unable to perform standing scaption AROM with correct form.  He has significant difficulty including compensatory shoulder hike with standing scaption.  Pt tolerated exercises well and had no pain during and after Rx.  He should benefit from cont skilled PT services to address impairments and goals and to restore desired level of function.            OBJECTIVE IMPAIRMENTS decreased  activity tolerance, decreased  endurance, decreased ROM, decreased strength, hypomobility, impaired flexibility, and pain.   ACTIVITY LIMITATIONS carrying, lifting, bathing, dressing, reach over head, hygiene/grooming, and caring for others  PARTICIPATION LIMITATIONS: meal prep, cleaning, community activity, and yard work   [employer] POTENTIAL: Good  CLINICAL DECISION MAKING: Stable/uncomplicated  EVALUATION COMPLEXITY: Low   GOALS:  SHORT TERM GOALS:   Pt will be independent and compliant with HEP for improved ROM, pain, and function.  Baseline: Goal status: GOAL MET Target date: 08/27/2021   2.  Pt will tolerate R shoulder PROM per protocol without significant pain for improved mobility and stiffness Baseline:  Goal status: GOAL MET  Target date: 08/06/2021   3.  Pt will demo R shoulder PROM to be 140 deg in flexion and 30 deg in ER for improved mobility and stiffness.  Baseline:  Goal status: GOAL MET Target date: 08/27/2021   4.  Pt will wean out of sling per MD allowance without adverse effects.  Baseline:  Goal status: GOAL MET Target date: 08/27/2021    5.  Pt will tolerate the initiation of shoulder AAROM without adverse effects.  Baseline:  Goal status: GOAL MET Target date: 08/27/2021   6.  Pt will demo supine shoulder AAROM to be 120 deg in flexion and in 40 deg in ER for progression of AAROM and protocol Baseline:  Goal status:  GOAL MET Target date:  09/10/2021  7.  Pt will demo R shoulder flexion and scaption AROM to at least 100 deg in standing without significant shoulder hike for improved UE elevation. Baseline:  Goal status: ONGOING Target date:  10/01/2021  8.  Pt will be able to perform his self care activities with no > than minimal difficulty.   Baseline:  Goal status: GOAL MET Target date:  10/08/2021   LONG TERM GOALS: Target date: 11/26/2021     Pt will demo R shoulder AROM to be [facility] t/o for performance of ADLs and IADLs.  Baseline:  Goal  status: PARTIALLY MET  2.  Pt will be able to perform his ADLs and IADLs without significant difficulty and pain. Baseline:  Goal status: PARTIALLY MET  3.   Pt will be able to perform his normal reaching and overhead activities without significant pain and limitation. Baseline:  Goal status: ONGOING  4.  Pt will demo at least 4/5 MMT strength t/o R shoulder for improved tolerance with and performance of daily activities, functional lifting, and performance of yard work.  Baseline:  Goal status: ONGOING  5.  Pt will be able to perform appropriate functional carrying and lifting without significant pain in order to perform IADLs and household chores. Baseline:  Goal status: ONGOING  6.  Pt will be independent with advanced HEP for improved shoulder strength and stability to assist with returning to desired level of function and performing car work and yard work. Baseline:  Goal status: ONGOING   PLAN: PT FREQUENCY:  2 times per week  PT DURATION: other: 6 weeks  PLANNED INTERVENTIONS: Therapeutic exercises, Therapeutic activity, Neuromuscular re-education, Patient/Family education, Joint mobilization, Aquatic Therapy, Dry Needling, Electrical stimulation, Spinal mobilization, Cryotherapy, Moist heat, Taping, Ultrasound, Manual therapy, and Re-evaluation  PLAN FOR NEXT SESSION:   Cont per Dr. Eddie Dibbles Rotator Cuff Repair protocol.  Will use subscapularis repair protocol also.  Pt had supraspinatus, infraspinatus, and subscapularis repairs and biceps tenodesis.      Selinda Michaels III PT, DPT 11/12/21 11:47 PM

## 2021-11-17 ENCOUNTER — Ambulatory Visit (HOSPITAL_BASED_OUTPATIENT_CLINIC_OR_DEPARTMENT_OTHER): Payer: Medicare PPO | Admitting: Physical Therapy

## 2021-11-17 DIAGNOSIS — M25511 Pain in right shoulder: Secondary | ICD-10-CM | POA: Diagnosis not present

## 2021-11-17 DIAGNOSIS — M25611 Stiffness of right shoulder, not elsewhere classified: Secondary | ICD-10-CM

## 2021-11-17 DIAGNOSIS — M6281 Muscle weakness (generalized): Secondary | ICD-10-CM

## 2021-11-17 NOTE — Therapy (Signed)
OUTPATIENT PHYSICAL THERAPY SHOULDER TREATMENT NOTE         Patient Name: Chad Manning MRN: [id number] DOB:1955/03/11, 66 y.o., male Today's Date: 11/18/2021     PT End of Session - 11/17/21 1027     Visit Number 27    Number of Visits 32    Date for PT Re-Evaluation 11/26/21    Authorization Type [employer]    Progress Note Due on Visit 28    PT Start Time [phone]    PT Stop Time 1021    PT Time Calculation (min) 43 min    Activity Tolerance Patient tolerated treatment well;No increased pain    Behavior During Therapy WFL for tasks assessed/performed                    Past Medical History:  Diagnosis Date   Arthritis    Asthma    x 1 age 81   Hyperlipidemia    Right rotator cuff tear    Past Surgical History:  Procedure Laterality Date   COLONOSCOPY     JOINT REPLACEMENT     SHOULDER ARTHROSCOPY WITH ROTATOR CUFF REPAIR AND OPEN BICEPS TENODESIS Right 07/13/2021   Procedure: RIGHT SHOULDER ARTHROSCOPY WITH ROTATOR CUFF REPAIR AND BICEPS TENODESIS;  Surgeon: Vanetta Mulders, MD;  Location: [facility];  Service: Orthopedics;  Laterality: Right;   TOTAL HIP ARTHROPLASTY     bilatera   Patient Active Problem List   Diagnosis Date Noted   Traumatic complete tear of right rotator cuff    Former smoker 12/14/2019   Acquired hallux rigidus of right foot 11/29/2018   Dyslipidemia 03/15/2018   Hyperglycemia 03/15/2018     REFERRING PROVIDER: Vanetta Mulders, MD   REFERRING DIAG: S46.011A (ICD-10-CM) - Traumatic complete tear of right rotator cuff, initial encounter   THERAPY DIAG:  Right shoulder pain, unspecified chronicity  Stiffness of right shoulder, not elsewhere classified  Muscle weakness (generalized)  Rationale for Evaluation and Treatment Rehabilitation  ONSET DATE: Injury 06/05/2021 ; DOS 07/13/2021  SUBJECTIVE:                                                                                                                                                                              SUBJECTIVE STATEMENT: Pt is 18 weeks and 1 day s/p supraspinatus, infraspinatus, and subscapularis repairs, biceps tenodesis, and subacromial decompression.   Pt states his shoulder is a little stiff and sore today, but no pain.  He may feel it more today because he's been trying to do more angled movements.  Pt reports improved stamina with reaching for shampoo bottle in the shower.  Pt states he feels a little better at things overall.  PERTINENT HISTORY: R shoulder arthroscopic rotator cuff repair of supraspinatus, infraspinatus, and subscapularis, biceps tenodesis, and subacromial decompression on 07/13/2021.  MD message indicated subscapularis restrictions also.  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 No NPRS:  Current and best:  0/10 ; Worst:  2-3/10 Location:  R shoulder  PRECAUTIONS: Other: RCR of supraspinatus, infraspinatus, and subscapularis.  Hx of bilat THA  WEIGHT BEARING RESTRICTIONS   FALLS:  Has patient fallen in last 6 months? Yes. Number of falls 1, this injury  LIVING ENVIRONMENT: Lives with: lives with their spouse Lives in: 1 story home Stairs: 2 steps without rail to enter home   OCCUPATION: Pt is retired  PLOF: Independent; Pt was able to perform all of his ADLs/IADLs and reaching and overhead activities independently without limitation.  Pt was able to perform yard work and work on car without limitation.  Pt was able to pick up granddaughter.   PATIENT GOALS return to PLOF, have full ROM and strength, to be able to perform car work and yard work, pick up granddaughter     OBJECTIVE:   DIAGNOSTIC FINDINGS:  Pt had x rays and a MRI prior to surgery.    TODAY'S TREATMENT:   Therapeutic Exercise: -Pt performed:     UBE x 4 mins    S/L ER 2# x15, 3# 2 x 10    S/L scaption with 2# 2x10    Standing jobe's flexion 3x10 with 1#    Shelf reach  2x10      Neuro Re-ed  Activities:    Supine rhythmic stab's at 90/60/120 deg of flexion with 1# 2x30 sec each    Supine ER/IR rhythmic stab's at 45/60/90 deg abd x30 sec each     Supine D2 flexion with 1# 2x10    4D ball rolls on wall 2 x 10 each  Manual Therapy: Pt received gentle distraction with oscillation and Grade II posterior and inferior jt mobs to R GH joint in supine to improve pain, ROM, and to normalize arthrokinematics f/b R shoulder PROM in flexion, abduction, ER, and IR per protocol ranges.           PATIENT EDUCATION: Education details:  HEP.  Educated pt with appropriate resistance and frequency.  PT answered pt's questions.  progression of protocol, POC, and exercise form.  Person educated: Patient Education method: Explanation, Demonstration, Tactile cues, Verbal cues, and handout. Education comprehension: verbalized understanding, returned demonstration, verbal cues required, tactile cues required, and needs further education   HOME EXERCISE PROGRAM: Access Code: [email or web address] URL: [email or web address] Date: 07/17/2021 Prepared by: Ronny Flurry     ASSESSMENT:  CLINICAL IMPRESSION: Pt reported having some tightness in R shoulder when presenting to Rx.  PT performed PROM and Pt has good PROM t/o L shoulder with some tightness in IR.  Pt is improving with shoulder strength as evidenced by performance of exercises including against increased resistance.  He has improved form and control with elevation.  Pt able to perform standing jobe's flexion with 1# well.  He had improved form and control with shelf reach though did fatigue.  Pt tolerated exercises well and had no pain during and after Rx.  He should benefit from cont skilled PT services to address impairments and goals and to restore desired level of function.            OBJECTIVE IMPAIRMENTS decreased activity tolerance, decreased endurance, decreased ROM, decreased strength, hypomobility, impaired  flexibility, and pain.    ACTIVITY LIMITATIONS carrying, lifting, bathing, dressing, reach over head, hygiene/grooming, and caring for others  PARTICIPATION LIMITATIONS: meal prep, cleaning, community activity, and yard work   [employer] POTENTIAL: Good  CLINICAL DECISION MAKING: Stable/uncomplicated  EVALUATION COMPLEXITY: Low   GOALS:  SHORT TERM GOALS:   Pt will be independent and compliant with HEP for improved ROM, pain, and function.  Baseline: Goal status: GOAL MET Target date: 08/27/2021   2.  Pt will tolerate R shoulder PROM per protocol without significant pain for improved mobility and stiffness Baseline:  Goal status: GOAL MET  Target date: 08/06/2021   3.  Pt will demo R shoulder PROM to be 140 deg in flexion and 30 deg in ER for improved mobility and stiffness.  Baseline:  Goal status: GOAL MET Target date: 08/27/2021   4.  Pt will wean out of sling per MD allowance without adverse effects.  Baseline:  Goal status: GOAL MET Target date: 08/27/2021    5.  Pt will tolerate the initiation of shoulder AAROM without adverse effects.  Baseline:  Goal status: GOAL MET Target date: 08/27/2021   6.  Pt will demo supine shoulder AAROM to be 120 deg in flexion and in 40 deg in ER for progression of AAROM and protocol Baseline:  Goal status:  GOAL MET Target date:  09/10/2021  7.  Pt will demo R shoulder flexion and scaption AROM to at least 100 deg in standing without significant shoulder hike for improved UE elevation. Baseline:  Goal status: ONGOING Target date:  10/01/2021  8.  Pt will be able to perform his self care activities with no > than minimal difficulty.   Baseline:  Goal status: GOAL MET Target date:  10/08/2021   LONG TERM GOALS: Target date: 11/26/2021     Pt will demo R shoulder AROM to be [facility] t/o for performance of ADLs and IADLs.  Baseline:  Goal status: PARTIALLY MET  2.  Pt will be able to perform his ADLs and IADLs without significant difficulty and  pain. Baseline:  Goal status: PARTIALLY MET  3.   Pt will be able to perform his normal reaching and overhead activities without significant pain and limitation. Baseline:  Goal status: ONGOING  4.  Pt will demo at least 4/5 MMT strength t/o R shoulder for improved tolerance with and performance of daily activities, functional lifting, and performance of yard work.  Baseline:  Goal status: ONGOING  5.  Pt will be able to perform appropriate functional carrying and lifting without significant pain in order to perform IADLs and household chores. Baseline:  Goal status: ONGOING  6.  Pt will be independent with advanced HEP for improved shoulder strength and stability to assist with returning to desired level of function and performing car work and yard work. Baseline:  Goal status: ONGOING   PLAN: PT FREQUENCY:  2 times per week  PT DURATION: other: 6 weeks  PLANNED INTERVENTIONS: Therapeutic exercises, Therapeutic activity, Neuromuscular re-education, Patient/Family education, Joint mobilization, Aquatic Therapy, Dry Needling, Electrical stimulation, Spinal mobilization, Cryotherapy, Moist heat, Taping, Ultrasound, Manual therapy, and Re-evaluation  PLAN FOR NEXT SESSION:   Cont per Dr. Eddie Dibbles Rotator Cuff Repair protocol.  Will use subscapularis repair protocol also.  Pt had supraspinatus, infraspinatus, and subscapularis repairs and biceps tenodesis.      Selinda Michaels III PT, DPT 11/18/21 11:14 AM

## 2021-11-18 ENCOUNTER — Encounter (HOSPITAL_BASED_OUTPATIENT_CLINIC_OR_DEPARTMENT_OTHER): Payer: Self-pay | Admitting: Physical Therapy

## 2021-11-19 ENCOUNTER — Encounter (HOSPITAL_BASED_OUTPATIENT_CLINIC_OR_DEPARTMENT_OTHER): Payer: Self-pay | Admitting: Physical Therapy

## 2021-11-19 ENCOUNTER — Ambulatory Visit (HOSPITAL_BASED_OUTPATIENT_CLINIC_OR_DEPARTMENT_OTHER): Payer: Medicare PPO | Admitting: Physical Therapy

## 2021-11-19 DIAGNOSIS — M25511 Pain in right shoulder: Secondary | ICD-10-CM | POA: Diagnosis not present

## 2021-11-19 DIAGNOSIS — M6281 Muscle weakness (generalized): Secondary | ICD-10-CM

## 2021-11-19 DIAGNOSIS — M25611 Stiffness of right shoulder, not elsewhere classified: Secondary | ICD-10-CM | POA: Diagnosis not present

## 2021-11-19 NOTE — Therapy (Signed)
OUTPATIENT PHYSICAL THERAPY SHOULDER TREATMENT NOTE         Patient Name: Chad Manning MRN: [id number] DOB:[birth year], 66 y.o., male Today's Date: 11/20/2021     PT End of Session - 11/19/21 0942     Visit Number 28    Number of Visits 32    Date for PT Re-Evaluation 11/26/21    Authorization Type [employer]    PT Start Time 0930    PT Stop Time 1012    PT Time Calculation (min) 42 min    Activity Tolerance Patient tolerated treatment well    Behavior During Therapy WFL for tasks assessed/performed                    Past Medical History:  Diagnosis Date   Arthritis    Asthma    x 1 age 29   Hyperlipidemia    Right rotator cuff tear    Past Surgical History:  Procedure Laterality Date   COLONOSCOPY     JOINT REPLACEMENT     SHOULDER ARTHROSCOPY WITH ROTATOR CUFF REPAIR AND OPEN BICEPS TENODESIS Right 07/13/2021   Procedure: RIGHT SHOULDER ARTHROSCOPY WITH ROTATOR CUFF REPAIR AND BICEPS TENODESIS;  Surgeon: Vanetta Mulders, MD;  Location: [facility];  Service: Orthopedics;  Laterality: Right;   TOTAL HIP ARTHROPLASTY     bilatera   Patient Active Problem List   Diagnosis Date Noted   Traumatic complete tear of right rotator cuff    Former smoker 12/14/2019   Acquired hallux rigidus of right foot 11/29/2018   Dyslipidemia 03/15/2018   Hyperglycemia 03/15/2018     REFERRING PROVIDER: Vanetta Mulders, MD   REFERRING DIAG: S46.011A (ICD-10-CM) - Traumatic complete tear of right rotator cuff, initial encounter   THERAPY DIAG:  Right shoulder pain, unspecified chronicity  Stiffness of right shoulder, not elsewhere classified  Muscle weakness (generalized)  Rationale for Evaluation and Treatment Rehabilitation  ONSET DATE: Injury 06/05/2021 ; DOS 07/13/2021  SUBJECTIVE:                                                                                                                                                                             SUBJECTIVE  STATEMENT: Pt is 18 weeks and 3 days s/p supraspinatus, infraspinatus, and subscapularis repairs, biceps tenodesis, and subacromial decompression.  Pt denies any adverse effects after prior Rx.  Pt states on a scale of 1 to 5 of stiffness, he is a 1.  He denies pain currently.  Pt reports he is improving with the lateral movement (scaption).  Pt was able to dry his hair easier this AM.  Pt reports improved stamina with reaching for shampoo bottle in the shower.  Pt states he feels  a little better at things overall.     PERTINENT HISTORY: R shoulder arthroscopic rotator cuff repair of supraspinatus, infraspinatus, and subscapularis, biceps tenodesis, and subacromial decompression on 07/13/2021.  MD message indicated subscapularis restrictions also.  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 No NPRS:  Current and best:  0/10 ; Worst:  2-3/10 Location:  R shoulder  PRECAUTIONS: Other: RCR of supraspinatus, infraspinatus, and subscapularis.  Hx of bilat THA  WEIGHT BEARING RESTRICTIONS   FALLS:  Has patient fallen in last 6 months? Yes. Number of falls 1, this injury  LIVING ENVIRONMENT: Lives with: lives with their spouse Lives in: 1 story home Stairs: 2 steps without rail to enter home   OCCUPATION: Pt is retired  PLOF: Independent; Pt was able to perform all of his ADLs/IADLs and reaching and overhead activities independently without limitation.  Pt was able to perform yard work and work on car without limitation.  Pt was able to pick up granddaughter.   PATIENT GOALS return to PLOF, have full ROM and strength, to be able to perform car work and yard work, pick up granddaughter     OBJECTIVE:   DIAGNOSTIC FINDINGS:  Pt had x rays and a MRI prior to surgery.    TODAY'S TREATMENT:   Therapeutic Exercise: -Pt performed:     UBE x 4 mins    S/L ER 3# 3 x 10    S/L scaption with 2# 2x10    Standing jobe's flexion x10 with 1# and 2x10 with  2#    Standing scaption with PT assist and pt eccentric lowering 2x10    Shelf reach  2x10     Seated ER with arm supported at 70 deg abd x 10 and x10 with 1#     Neuro Re-ed Activities:    Supine rhythmic stab's at 90/60/120 deg of flexion with 1# 2x30 sec each    Supine ER/IR rhythmic stab's at 45/60/90 deg abd x30 sec each     Supine D2 flexion with 1# 2x10      Manual Therapy: Pt received gentle distraction with oscillation and Grade II posterior and inferior jt mobs to R GH joint in supine to improve pain, ROM, and to normalize arthrokinematics f/b R shoulder PROM in flexion, abduction, ER, and IR per protocol ranges.           PATIENT EDUCATION: Education details:  HEP.  Educated pt with appropriate resistance and frequency.  PT answered pt's questions.  progression of protocol, POC, and exercise form.  Person educated: Patient Education method: Explanation, Demonstration, Tactile cues, Verbal cues Education comprehension: verbalized understanding, returned demonstration, verbal cues required, tactile cues required, and needs further education   HOME EXERCISE PROGRAM: Access Code: [email or web address] URL: [email or web address] Date: 07/17/2021 Prepared by: Ronny Flurry     ASSESSMENT:  CLINICAL IMPRESSION: Pt continues to have compensatory shoulder hike though is improving with UE elevation.  Pt was able to perform standing jobe's flexion well with 2# today.  Though he continues to struggle with and has limitations with scaption against gravity, he reports improved ability to perform scaption and also demonstrated improved scaption today.  PT performed PROM and Pt has good PROM t/o L shoulder with some tightness in IR.  Pt gives great effort with all exercises and performed strengthening and stabilization exercises well without c/o's.  He responded well to Rx having no pain after Rx.  He should benefit from cont  skilled PT services to address impairments and goals and to  restore desired level of function.            OBJECTIVE IMPAIRMENTS decreased activity tolerance, decreased endurance, decreased ROM, decreased strength, hypomobility, impaired flexibility, and pain.   ACTIVITY LIMITATIONS carrying, lifting, bathing, dressing, reach over head, hygiene/grooming, and caring for others  PARTICIPATION LIMITATIONS: meal prep, cleaning, community activity, and yard work   [employer] POTENTIAL: Good  CLINICAL DECISION MAKING: Stable/uncomplicated  EVALUATION COMPLEXITY: Low   GOALS:  SHORT TERM GOALS:   Pt will be independent and compliant with HEP for improved ROM, pain, and function.  Baseline: Goal status: GOAL MET Target date: 08/27/2021   2.  Pt will tolerate R shoulder PROM per protocol without significant pain for improved mobility and stiffness Baseline:  Goal status: GOAL MET  Target date: 08/06/2021   3.  Pt will demo R shoulder PROM to be 140 deg in flexion and 30 deg in ER for improved mobility and stiffness.  Baseline:  Goal status: GOAL MET Target date: 08/27/2021   4.  Pt will wean out of sling per MD allowance without adverse effects.  Baseline:  Goal status: GOAL MET Target date: 08/27/2021    5.  Pt will tolerate the initiation of shoulder AAROM without adverse effects.  Baseline:  Goal status: GOAL MET Target date: 08/27/2021   6.  Pt will demo supine shoulder AAROM to be 120 deg in flexion and in 40 deg in ER for progression of AAROM and protocol Baseline:  Goal status:  GOAL MET Target date:  09/10/2021  7.  Pt will demo R shoulder flexion and scaption AROM to at least 100 deg in standing without significant shoulder hike for improved UE elevation. Baseline:  Goal status: ONGOING Target date:  10/01/2021  8.  Pt will be able to perform his self care activities with no > than minimal difficulty.   Baseline:  Goal status: GOAL MET Target date:  10/08/2021   LONG TERM GOALS: Target date: 11/26/2021     Pt will demo R  shoulder AROM to be [facility] t/o for performance of ADLs and IADLs.  Baseline:  Goal status: PARTIALLY MET  2.  Pt will be able to perform his ADLs and IADLs without significant difficulty and pain. Baseline:  Goal status: PARTIALLY MET  3.   Pt will be able to perform his normal reaching and overhead activities without significant pain and limitation. Baseline:  Goal status: ONGOING  4.  Pt will demo at least 4/5 MMT strength t/o R shoulder for improved tolerance with and performance of daily activities, functional lifting, and performance of yard work.  Baseline:  Goal status: ONGOING  5.  Pt will be able to perform appropriate functional carrying and lifting without significant pain in order to perform IADLs and household chores. Baseline:  Goal status: ONGOING  6.  Pt will be independent with advanced HEP for improved shoulder strength and stability to assist with returning to desired level of function and performing car work and yard work. Baseline:  Goal status: ONGOING   PLAN: PT FREQUENCY:  2 times per week  PT DURATION: other: 6 weeks  PLANNED INTERVENTIONS: Therapeutic exercises, Therapeutic activity, Neuromuscular re-education, Patient/Family education, Joint mobilization, Aquatic Therapy, Dry Needling, Electrical stimulation, Spinal mobilization, Cryotherapy, Moist heat, Taping, Ultrasound, Manual therapy, and Re-evaluation  PLAN FOR NEXT SESSION:   Cont per Dr. Eddie Dibbles Rotator Cuff Repair protocol.  Will use subscapularis repair protocol also.  Pt had  supraspinatus, infraspinatus, and subscapularis repairs and biceps tenodesis.      Selinda Michaels III PT, DPT 11/20/21 11:21 AM

## 2021-11-23 NOTE — Therapy (Signed)
OUTPATIENT PHYSICAL THERAPY SHOULDER TREATMENT NOTE         Patient Name: Chad Manning MRN: [id number] DOB:08-26-1955, 66 y.o., male Today's Date: 11/24/2021     PT End of Session - 11/24/21 0957     Visit Number 29    Number of Visits 32    Date for PT Re-Evaluation 11/26/21    Authorization Type [employer]    PT Start Time [phone]    PT Stop Time 1016    PT Time Calculation (min) 40 min    Activity Tolerance Patient tolerated treatment well    Behavior During Therapy WFL for tasks assessed/performed                     Past Medical History:  Diagnosis Date   Arthritis    Asthma    x 1 age 55   Hyperlipidemia    Right rotator cuff tear    Past Surgical History:  Procedure Laterality Date   COLONOSCOPY     JOINT REPLACEMENT     SHOULDER ARTHROSCOPY WITH ROTATOR CUFF REPAIR AND OPEN BICEPS TENODESIS Right 07/13/2021   Procedure: RIGHT SHOULDER ARTHROSCOPY WITH ROTATOR CUFF REPAIR AND BICEPS TENODESIS;  Surgeon: Vanetta Mulders, MD;  Location: [facility];  Service: Orthopedics;  Laterality: Right;   TOTAL HIP ARTHROPLASTY     bilatera   Patient Active Problem List   Diagnosis Date Noted   Traumatic complete tear of right rotator cuff    Former smoker 12/14/2019   Acquired hallux rigidus of right foot 11/29/2018   Dyslipidemia 03/15/2018   Hyperglycemia 03/15/2018     REFERRING PROVIDER: Vanetta Mulders, MD   REFERRING DIAG: S46.011A (ICD-10-CM) - Traumatic complete tear of right rotator cuff, initial encounter   THERAPY DIAG:  Right shoulder pain, unspecified chronicity  Stiffness of right shoulder, not elsewhere classified  Muscle weakness (generalized)  Rationale for Evaluation and Treatment Rehabilitation  ONSET DATE: Injury 06/05/2021 ; DOS 07/13/2021  SUBJECTIVE:                                                                                                                                                                             SUBJECTIVE  STATEMENT: Pt is 19 weeks and 1 day s/p supraspinatus, infraspinatus, and subscapularis repairs, biceps tenodesis, and subacromial decompression.  Pt denies any adverse effects after prior Rx.  Pt states on a scale of 1 to 5 of stiffness, he is a 1.  He denies pain currently.  Pt reports he is improving with the lateral movement (scaption).  Pt was able to dry his hair easier this AM.  Pt reports improved stamina with reaching for shampoo bottle in the shower.  Pt states he  feels a little better at things overall.   Pt reports improved reaching at an angle (in the scapular plane).  Pt states his R elbow has bothered home some though it's also bothered him in the past.  Pt denies any limitations with self care activities.     PERTINENT HISTORY: R shoulder arthroscopic rotator cuff repair of supraspinatus, infraspinatus, and subscapularis, biceps tenodesis, and subacromial decompression on 07/13/2021.  MD message indicated subscapularis restrictions also.  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 No NPRS:  Current and best:  0/10 ; Worst:  2-3/10 Location:  R shoulder  PRECAUTIONS: Other: RCR of supraspinatus, infraspinatus, and subscapularis.  Hx of bilat THA  WEIGHT BEARING RESTRICTIONS   FALLS:  Has patient fallen in last 6 months? Yes. Number of falls 1, this injury  LIVING ENVIRONMENT: Lives with: lives with their spouse Lives in: 1 story home Stairs: 2 steps without rail to enter home   OCCUPATION: Pt is retired  PLOF: Independent; Pt was able to perform all of his ADLs/IADLs and reaching and overhead activities independently without limitation.  Pt was able to perform yard work and work on car without limitation.  Pt was able to pick up granddaughter.   PATIENT GOALS return to PLOF, have full ROM and strength, to be able to perform car work and yard work, pick up granddaughter     OBJECTIVE:   DIAGNOSTIC FINDINGS:  Pt had x rays and a  MRI prior to surgery.    TODAY'S TREATMENT:   R shoulder AROM:  flexion:  152 deg in standing, scaption:  143 deg, Abd:  85 deg    Therapeutic Exercise: -Reviewed current function, HEP compliance, pain level, and response to prior Rx. -Assessed shoulder ROM -Pt performed:     UBE at L2 x 4 mins (2' fwd/2' bwd)    Standing jobe's flexion 3x10 with 2#    Standing scaption AROM 2x10 with verbal and visual cuing to decrease shoulder hike    Shelf reach  x10 AROM, 2x10 with 1#     Seated ER with arm supported at 70 deg abd 3x10 with 1#    Standing functional IR AROM x10 w/n pt tolerance    Standing wand IR 2x10     Neuro Re-ed Activities:    Supine rhythmic stab's at 90/60/120 deg of flexion with 1# 1x1 min each    Standing D2 flexion 2x10    4D ball rolls on wall 2x10 each            PATIENT EDUCATION: Education details:  HEP.  Educated pt with appropriate resistance and frequency.  PT answered pt's questions.  progression of protocol, POC, and exercise form.  Person educated: Patient Education method: Explanation, Demonstration, Tactile cues, Verbal cues Education comprehension: verbalized understanding, returned demonstration, verbal cues required, tactile cues required, and needs further education   HOME EXERCISE PROGRAM: Access Code: [email or web address] URL: [email or web address] Date: 07/17/2021 Prepared by: Ronny Flurry     ASSESSMENT:  CLINICAL IMPRESSION: Pt demonstrates much improvement in R UE elevation including improved AROM in flexion and scaption in standing.  Pt was able to perform standing scaption without assistance with improved form, range, and decreased shoulder hike.  He did have a compensatory shoulder hike with increased reps of standing scaption with fatigue.  Pt performed in front of mirror for visual cuing and PT provided verbal cuing.  Pt was able to perform shelf reaches with  1# with much improved form and tolerance.  Pt also able to progress  supine D2 flexion to standing today.  Pt is improving with R shoulder strength.  Pt met STG #7.  He responded well to Rx having no pain after Rx.  He should benefit from cont skilled PT services to address impairments and goals and to restore desired level of function.             OBJECTIVE IMPAIRMENTS decreased activity tolerance, decreased endurance, decreased ROM, decreased strength, hypomobility, impaired flexibility, and pain.   ACTIVITY LIMITATIONS carrying, lifting, bathing, dressing, reach over head, hygiene/grooming, and caring for others  PARTICIPATION LIMITATIONS: meal prep, cleaning, community activity, and yard work   [employer] POTENTIAL: Good  CLINICAL DECISION MAKING: Stable/uncomplicated  EVALUATION COMPLEXITY: Low   GOALS:  SHORT TERM GOALS:   Pt will be independent and compliant with HEP for improved ROM, pain, and function.  Baseline: Goal status: GOAL MET Target date: 08/27/2021   2.  Pt will tolerate R shoulder PROM per protocol without significant pain for improved mobility and stiffness Baseline:  Goal status: GOAL MET  Target date: 08/06/2021   3.  Pt will demo R shoulder PROM to be 140 deg in flexion and 30 deg in ER for improved mobility and stiffness.  Baseline:  Goal status: GOAL MET Target date: 08/27/2021   4.  Pt will wean out of sling per MD allowance without adverse effects.  Baseline:  Goal status: GOAL MET Target date: 08/27/2021    5.  Pt will tolerate the initiation of shoulder AAROM without adverse effects.  Baseline:  Goal status: GOAL MET Target date: 08/27/2021   6.  Pt will demo supine shoulder AAROM to be 120 deg in flexion and in 40 deg in ER for progression of AAROM and protocol Baseline:  Goal status:  GOAL MET Target date:  09/10/2021  7.  Pt will demo R shoulder flexion and scaption AROM to at least 100 deg in standing without significant shoulder hike for improved UE elevation. Baseline:  Goal status: GOAL MET Target date:   10/01/2021  8.  Pt will be able to perform his self care activities with no > than minimal difficulty.   Baseline:  Goal status: GOAL MET Target date:  10/08/2021   LONG TERM GOALS: Target date: 11/26/2021     Pt will demo R shoulder AROM to be [facility] t/o for performance of ADLs and IADLs.  Baseline:  Goal status: PARTIALLY MET  2.  Pt will be able to perform his ADLs and IADLs without significant difficulty and pain. Baseline:  Goal status: PARTIALLY MET  3.   Pt will be able to perform his normal reaching and overhead activities without significant pain and limitation. Baseline:  Goal status: ONGOING  4.  Pt will demo at least 4/5 MMT strength t/o R shoulder for improved tolerance with and performance of daily activities, functional lifting, and performance of yard work.  Baseline:  Goal status: ONGOING  5.  Pt will be able to perform appropriate functional carrying and lifting without significant pain in order to perform IADLs and household chores. Baseline:  Goal status: ONGOING  6.  Pt will be independent with advanced HEP for improved shoulder strength and stability to assist with returning to desired level of function and performing car work and yard work. Baseline:  Goal status: ONGOING   PLAN: PT FREQUENCY:  2 times per week  PT DURATION: other: 6 weeks  PLANNED INTERVENTIONS: Therapeutic  exercises, Therapeutic activity, Neuromuscular re-education, Patient/Family education, Joint mobilization, Aquatic Therapy, Dry Needling, Electrical stimulation, Spinal mobilization, Cryotherapy, Moist heat, Taping, Ultrasound, Manual therapy, and Re-evaluation  PLAN FOR NEXT SESSION:   Cont per Dr. Eddie Dibbles Rotator Cuff Repair protocol.  Will use subscapularis repair protocol also.  Pt had supraspinatus, infraspinatus, and subscapularis repairs and biceps tenodesis.      Selinda Michaels III PT, DPT 11/24/21 2:10 PM

## 2021-11-24 ENCOUNTER — Ambulatory Visit (HOSPITAL_BASED_OUTPATIENT_CLINIC_OR_DEPARTMENT_OTHER): Payer: Medicare PPO | Admitting: Physical Therapy

## 2021-11-24 ENCOUNTER — Encounter (HOSPITAL_BASED_OUTPATIENT_CLINIC_OR_DEPARTMENT_OTHER): Payer: Self-pay | Admitting: Physical Therapy

## 2021-11-24 DIAGNOSIS — M25511 Pain in right shoulder: Secondary | ICD-10-CM | POA: Diagnosis not present

## 2021-11-24 DIAGNOSIS — M6281 Muscle weakness (generalized): Secondary | ICD-10-CM

## 2021-11-24 DIAGNOSIS — M25611 Stiffness of right shoulder, not elsewhere classified: Secondary | ICD-10-CM | POA: Diagnosis not present

## 2021-11-26 ENCOUNTER — Encounter (HOSPITAL_BASED_OUTPATIENT_CLINIC_OR_DEPARTMENT_OTHER): Payer: Self-pay | Admitting: Physical Therapy

## 2021-11-26 ENCOUNTER — Ambulatory Visit (HOSPITAL_BASED_OUTPATIENT_CLINIC_OR_DEPARTMENT_OTHER): Payer: Medicare PPO | Admitting: Physical Therapy

## 2021-11-26 DIAGNOSIS — M25611 Stiffness of right shoulder, not elsewhere classified: Secondary | ICD-10-CM | POA: Diagnosis not present

## 2021-11-26 DIAGNOSIS — M6281 Muscle weakness (generalized): Secondary | ICD-10-CM

## 2021-11-26 DIAGNOSIS — M25511 Pain in right shoulder: Secondary | ICD-10-CM | POA: Diagnosis not present

## 2021-11-26 NOTE — Therapy (Signed)
OUTPATIENT PHYSICAL THERAPY SHOULDER TREATMENT NOTE  Progress Note Reporting Period 10/20/21 to 11/26/21  See note below for Objective Data and Assessment of Progress/Goals.      Patient Name: Chad Manning MRN: [id number] DOB:December 18, 1955, 66 y.o., male Today's Date: 11/27/2021     PT End of Session - 11/26/21 0916     Visit Number 30    Number of Visits 42    Date for PT Re-Evaluation 01/07/22    Authorization Type [employer]    PT Start Time 0854    PT Stop Time 0936    PT Time Calculation (min) 42 min    Activity Tolerance Patient tolerated treatment well    Behavior During Therapy WFL for tasks assessed/performed                     Past Medical History:  Diagnosis Date   Arthritis    Asthma    x 1 age 58   Hyperlipidemia    Right rotator cuff tear    Past Surgical History:  Procedure Laterality Date   COLONOSCOPY     JOINT REPLACEMENT     SHOULDER ARTHROSCOPY WITH ROTATOR CUFF REPAIR AND OPEN BICEPS TENODESIS Right 07/13/2021   Procedure: RIGHT SHOULDER ARTHROSCOPY WITH ROTATOR CUFF REPAIR AND BICEPS TENODESIS;  Surgeon: Vanetta Mulders, MD;  Location: [facility];  Service: Orthopedics;  Laterality: Right;   TOTAL HIP ARTHROPLASTY     bilatera   Patient Active Problem List   Diagnosis Date Noted   Traumatic complete tear of right rotator cuff    Former smoker 12/14/2019   Acquired hallux rigidus of right foot 11/29/2018   Dyslipidemia 03/15/2018   Hyperglycemia 03/15/2018     REFERRING PROVIDER: Vanetta Mulders, MD   REFERRING DIAG: S46.011A (ICD-10-CM) - Traumatic complete tear of right rotator cuff, initial encounter   THERAPY DIAG:   Muscle weakness (generalized)  Stiffness of right shoulder, not elsewhere classified  Right shoulder pain, unspecified chronicity  Rationale for Evaluation and Treatment Rehabilitation  ONSET DATE: Injury 06/05/2021 ; DOS 07/13/2021  SUBJECTIVE:                                                                                                                                                                              SUBJECTIVE STATEMENT: Pt is 19 weeks and 3 days s/p supraspinatus, infraspinatus, and subscapularis repairs, biceps tenodesis, and subacromial decompression.  Pt denies any adverse effects after prior Rx.  He denies pain currently.  Pt reports he is improving with the lateral movement (scaption).  Pt was able to dry his hair easier this AM.  Pt reports improved stamina with reaching for shampoo bottle in the shower.  Pt states  he feels a little better at things overall.    FUNCTIONAL IMPROVEMENTS:  light yard work and reaching into [employer].  Pt reports improved reaching at an angle (in the scapular plane).  Pt denies any limitations with self care activities.  FUNCTIONAL LIMITATIONS:  reaching, overhead activities, yard work, working on car    PERTINENT HISTORY: R shoulder arthroscopic rotator cuff repair of supraspinatus, infraspinatus, and subscapularis, biceps tenodesis, and subacromial decompression on 07/13/2021.  MD message indicated subscapularis restrictions also.  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 No NPRS:  Current and best:  0/10 ; Worst:  2-3/10 Location:  R shoulder  PRECAUTIONS: Other: RCR of supraspinatus, infraspinatus, and subscapularis.  Hx of bilat THA  WEIGHT BEARING RESTRICTIONS   FALLS:  Has patient fallen in last 6 months? Yes. Number of falls 1, this injury  LIVING ENVIRONMENT: Lives with: lives with their spouse Lives in: 1 story home Stairs: 2 steps without rail to enter home   OCCUPATION: Pt is retired  PLOF: Independent; Pt was able to perform all of his ADLs/IADLs and reaching and overhead activities independently without limitation.  Pt was able to perform yard work and work on car without limitation.  Pt was able to pick up granddaughter.   PATIENT GOALS return to PLOF, have full ROM and strength, to be  able to perform car work and yard work, pick up granddaughter     OBJECTIVE:   DIAGNOSTIC FINDINGS:  Pt had x rays and a MRI prior to surgery.    TODAY'S TREATMENT:   PATIENT SURVEYS:  UEFI Prior/Current: 67/80 69/80   R shoulder AROM:  flexion:  152 deg in standing, scaption:  143 deg, Abd:  85 deg, ER:  85. IR:  51 deg  R shoulder strength:  flex:  unable to tolerate resistance, ER: 4-/5, IR:  unable to tolerate resistance   Therapeutic Exercise: -Reviewed current function, HEP compliance, pain level, and response to prior Rx. -Assessed shoulder ROM and strength. -Pt performed:     UBE at L2 x 4 mins (2' fwd/2' bwd)    Standing jobe's flexion 3x10 with 2#    Standing scaption AROM 2x10 and 1x6 with verbal and visual cuing to decrease shoulder hike    Shelf reach 3x10 with 1#     Seated ER with arm supported at 70 deg abd 3x10 with 1#    Standing functional IR AROM x10 w/n pt tolerance    Standing wand IR 2x10     Neuro Re-ed Activities:    Supine rhythmic stab's at 90/60/120 deg of flexion with 1# 1x1 min each    Standing D2 flexion x10 and x 8    4D ball rolls on wall 2x10 each            PATIENT EDUCATION: Education details:  HEP.  Educated pt with appropriate resistance and frequency.  Objective findings, progression of protocol, POC, and exercise form.  Person educated: Patient Education method: Explanation, Demonstration, Tactile cues, Verbal cues Education comprehension: verbalized understanding, returned demonstration, verbal cues required, tactile cues required, and needs further education   HOME EXERCISE PROGRAM: Access Code: [email or web address] URL: [email or web address] Date: 07/17/2021 Prepared by: Ronny Flurry     ASSESSMENT:  CLINICAL IMPRESSION: Pt is making progress with ROM, function, and tolerance to activity though continues to have significant muscle weakness.  He has made good progress with R shoulder elevation against gravity.   His compensatory  shoulder hike with elevation has improved and is much less than what it was.  It worsens with fatigue and lack of concentration.  He has much improved shoulder hike with cuing and concentration.  Pt is able to perform standing scaption overhead though is very limited with abd in standing.  Pt denies any limitations with self care activities.  He also denies pain.  Pt is increasing with resistance with exercises per protocol.  He continues to have significant strength deficits in R shoulder.  He reports improved reaching though is still limited with overhead activities.  Pt has met all STG's and is progressing toward LTG's.  He should benefit from cont skilled PT services to address impairments and goals and to restore desired level of function.             OBJECTIVE IMPAIRMENTS decreased activity tolerance, decreased endurance, decreased ROM, decreased strength, hypomobility, impaired flexibility, and pain.   ACTIVITY LIMITATIONS carrying, lifting, bathing, dressing, reach over head, hygiene/grooming, and caring for others  PARTICIPATION LIMITATIONS: meal prep, cleaning, community activity, and yard work   [employer] POTENTIAL: Good  CLINICAL DECISION MAKING: Stable/uncomplicated  EVALUATION COMPLEXITY: Low   GOALS:  SHORT TERM GOALS:   Pt will be independent and compliant with HEP for improved ROM, pain, and function.  Baseline: Goal status: GOAL MET Target date: 08/27/2021   2.  Pt will tolerate R shoulder PROM per protocol without significant pain for improved mobility and stiffness Baseline:  Goal status: GOAL MET  Target date: 08/06/2021   3.  Pt will demo R shoulder PROM to be 140 deg in flexion and 30 deg in ER for improved mobility and stiffness.  Baseline:  Goal status: GOAL MET Target date: 08/27/2021   4.  Pt will wean out of sling per MD allowance without adverse effects.  Baseline:  Goal status: GOAL MET Target date: 08/27/2021    5.  Pt will tolerate  the initiation of shoulder AAROM without adverse effects.  Baseline:  Goal status: GOAL MET Target date: 08/27/2021   6.  Pt will demo supine shoulder AAROM to be 120 deg in flexion and in 40 deg in ER for progression of AAROM and protocol Baseline:  Goal status:  GOAL MET Target date:  09/10/2021  7.  Pt will demo R shoulder flexion and scaption AROM to at least 100 deg in standing without significant shoulder hike for improved UE elevation. Baseline:  Goal status: GOAL MET Target date:  10/01/2021  8.  Pt will be able to perform his self care activities with no > than minimal difficulty.   Baseline:  Goal status: GOAL MET Target date:  10/08/2021   LONG TERM GOALS: Target date: 01/07/2022     Pt will demo R shoulder AROM to be [facility] t/o for performance of ADLs and IADLs.  Baseline:  Goal status: 50% MET  2.  Pt will be able to perform his ADLs and IADLs without significant difficulty and pain. Baseline:  Goal status: PARTIALLY MET  3.   Pt will be able to perform his normal reaching and overhead activities without significant pain and limitation. Baseline:  Goal status: PROGRESSING  4.  Pt will demo at least 4/5 MMT strength t/o R shoulder for improved tolerance with and performance of daily activities, functional lifting, and performance of yard work.  Baseline:  Goal status: ONGOING  5.  Pt will be able to perform appropriate functional carrying and lifting without significant pain in order to perform IADLs  and household chores. Baseline:  Goal status: PROGRESSING  6.  Pt will be independent with advanced HEP for improved shoulder strength and stability to assist with returning to desired level of function and performing car work and yard work. Baseline:  Goal status: PROGRESSING   PLAN: PT FREQUENCY:  2 times per week  PT DURATION: other: 6 weeks  PLANNED INTERVENTIONS: Therapeutic exercises, Therapeutic activity, Neuromuscular re-education, Patient/Family  education, Joint mobilization, Aquatic Therapy, Dry Needling, Electrical stimulation, Spinal mobilization, Cryotherapy, Moist heat, Taping, Ultrasound, Manual therapy, and Re-evaluation  PLAN FOR NEXT SESSION:   Cont per Dr. Eddie Dibbles Rotator Cuff Repair protocol.  Will use subscapularis repair protocol also.  Pt had supraspinatus, infraspinatus, and subscapularis repairs and biceps tenodesis.      Referring diagnosis? S46.011A (ICD-10-CM) - Traumatic complete tear of right rotator cuff, initial encounter   Treatment diagnosis? (if different than referring diagnosis)  Muscle weakness (generalized)  Stiffness of right shoulder, not elsewhere classified  Right shoulder pain, unspecified chronicity       What was this (referring dx) caused by? _0  Surgery _1  Fall _2  Ongoing issue _3  Arthritis _4  Other: ____________  Laterality: _5  Rt _6  Lt _7  Both  Check all possible CPT codes:  *CHOOSE 10 OR LESS*    _8  97110 (Therapeutic Exercise)  _9  92507 (SLP Treatment)  _10  25366 (Neuro Re-ed)   _11  92526 (Swallowing Treatment)   _12  44034 (Gait Training)   _13  74259 (Cognitive Training, 1st 15 minutes) _14  97140 (Manual Therapy)   _15  97130 (Cognitive Training, each add'l 15 minutes)  _16  97164 (Re-evaluation)                              _17  Other, List CPT Code ____________  _18  56387 (Therapeutic Activities)     _19  56433 (Self Care)   _20  All codes above (97110 - 29518)  _21  97012 (Mechanical Traction)  _22  97014 (E-stim Unattended)  _23  97032 (E-stim manual)  _24  97033 (Ionto)  _25  97035 (Ultrasound) _26  97750 (Physical Performance Training) _27  84166 (Aquatic Therapy) _28  97016 (Vasopneumatic Device) _29  06301 (Paraffin) _30  60109 (Contrast Bath) _31  97597 (Wound Care 1st 20 sq cm) _32  97598 (Wound Care each add'l 20 sq cm) _33  97760 (Orthotic Fabrication, Fitting, Training Initial) _34  [id number] (Prosthetic Management and Training Initial) _35  [id number] (Orthotic or Prosthetic Training/  Modification Subsequent)  Selinda Michaels III PT, DPT 11/27/21 2:42 PM

## 2021-12-08 ENCOUNTER — Ambulatory Visit (HOSPITAL_BASED_OUTPATIENT_CLINIC_OR_DEPARTMENT_OTHER): Payer: Medicare PPO | Admitting: Physical Therapy

## 2021-12-08 ENCOUNTER — Other Ambulatory Visit (HOSPITAL_BASED_OUTPATIENT_CLINIC_OR_DEPARTMENT_OTHER): Payer: Self-pay

## 2021-12-08 ENCOUNTER — Encounter (HOSPITAL_BASED_OUTPATIENT_CLINIC_OR_DEPARTMENT_OTHER): Payer: Self-pay | Admitting: Physical Therapy

## 2021-12-08 DIAGNOSIS — M25611 Stiffness of right shoulder, not elsewhere classified: Secondary | ICD-10-CM

## 2021-12-08 DIAGNOSIS — M6281 Muscle weakness (generalized): Secondary | ICD-10-CM

## 2021-12-08 DIAGNOSIS — M25511 Pain in right shoulder: Secondary | ICD-10-CM

## 2021-12-08 MED ORDER — INFLUENZA VAC A&B SA ADJ QUAD 0.5 ML IM PRSY
PREFILLED_SYRINGE | INTRAMUSCULAR | 0 refills | Status: DC
  Filled 2021-12-08: qty 0.5, 1d supply, fill #0

## 2021-12-08 NOTE — Therapy (Signed)
OUTPATIENT PHYSICAL THERAPY SHOULDER TREATMENT NOTE  Progress Note Reporting Period 10/20/21 to 11/26/21  See note below for Objective Data and Assessment of Progress/Goals.      Patient Name: Chad Manning MRN: [id number] DOB:1955-07-25, 66 y.o., male Today's Date: 12/08/2021     PT End of Session - 12/08/21 0945     Visit Number 85    Authorization Type [employer]    PT Start Time [id number]                     Past Medical History:  Diagnosis Date   Arthritis    Asthma    x 1 age 66   Hyperlipidemia    Right rotator cuff tear    Past Surgical History:  Procedure Laterality Date   COLONOSCOPY     JOINT REPLACEMENT     SHOULDER ARTHROSCOPY WITH ROTATOR CUFF REPAIR AND OPEN BICEPS TENODESIS Right 07/13/2021   Procedure: RIGHT SHOULDER ARTHROSCOPY WITH ROTATOR CUFF REPAIR AND BICEPS TENODESIS;  Surgeon: Vanetta Mulders, MD;  Location: [place];  Service: Orthopedics;  Laterality: Right;   TOTAL HIP ARTHROPLASTY     bilatera   Patient Active Problem List   Diagnosis Date Noted   Traumatic complete tear of right rotator cuff    Former smoker 12/14/2019   Acquired hallux rigidus of right foot 11/29/2018   Dyslipidemia 03/15/2018   Hyperglycemia 03/15/2018     REFERRING PROVIDER: Vanetta Mulders, MD   REFERRING DIAG: S46.011A (ICD-10-CM) - Traumatic complete tear of right rotator cuff, initial encounter   THERAPY DIAG:   Muscle weakness (generalized)  Stiffness of right shoulder, not elsewhere classified  Right shoulder pain, unspecified chronicity  Rationale for Evaluation and Treatment Rehabilitation  ONSET DATE: Injury 06/05/2021 ; DOS 07/13/2021  SUBJECTIVE:                                                                                                                                                                             SUBJECTIVE STATEMENT: Pt is 21 weeks and 1 day s/p supraspinatus, infraspinatus, and subscapularis repairs, biceps tenodesis, and  subacromial decompression.  Pt denies any adverse effects after prior Rx.  He denies pain currently.  "I think I am getting better".  Pt was able to reach in garage to get Christmas decorations.  Pt has improved reaching in the glove box in the car.  Pt states he is doing more now without thinking about it.        Pt reports he is improving with the lateral movement (scaption).  Pt was able to dry his hair easier this AM.  Pt reports improved stamina with reaching for shampoo bottle in the shower.  Pt states he  feels a little better at things overall.    FUNCTIONAL IMPROVEMENTS:  light yard work and reaching into [employer].  Pt reports improved reaching at an angle (in the scapular plane).  Pt denies any limitations with self care activities.  FUNCTIONAL LIMITATIONS:  reaching, overhead activities, yard work, working on car    PERTINENT HISTORY: R shoulder arthroscopic rotator cuff repair of supraspinatus, infraspinatus, and subscapularis, biceps tenodesis, and subacromial decompression on 07/13/2021.  MD message indicated subscapularis restrictions also.  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 No NPRS:  Current and best:  0/10 ; Worst:  2-3/10 Location:  R shoulder  PRECAUTIONS: Other: RCR of supraspinatus, infraspinatus, and subscapularis.  Hx of bilat THA  WEIGHT BEARING RESTRICTIONS   FALLS:  Has patient fallen in last 6 months? Yes. Number of falls 1, this injury  LIVING ENVIRONMENT: Lives with: lives with their spouse Lives in: 1 story home Stairs: 2 steps without rail to enter home   OCCUPATION: Pt is retired  PLOF: Independent; Pt was able to perform all of his ADLs/IADLs and reaching and overhead activities independently without limitation.  Pt was able to perform yard work and work on car without limitation.  Pt was able to pick up granddaughter.   PATIENT GOALS return to PLOF, have full ROM and strength, to be able to perform car work  and yard work, pick up granddaughter     OBJECTIVE:   DIAGNOSTIC FINDINGS:  Pt had x rays and a MRI prior to surgery.    TODAY'S TREATMENT:   Therapeutic Exercise: -Reviewed current function, HEP compliance, pain level, and response to prior Rx. -Pt performed:     UBE at L2 x 5 mins (3' fwd/2' bwd)    Standing jobe's flexion 3x10 with 2#    Standing scaption AROM 3x10 with verbal and visual cuing to decrease shoulder hike    Standing wand abd 2x10    Standing shoulder wall walks in abd x 10 reps    Shelf reach 3x10 with 1#     Standing wand IR 2x10     Neuro Re-ed Activities:    Standing D2 flexion 2x7-8    Supine D2 flexion with 1# 2x10    4D ball rolls on wall 2x10 each     Wall walks with YTB x 2 sets           PATIENT EDUCATION: Education details:  HEP.  Educated pt with appropriate resistance and frequency.  Objective findings, progression of protocol, POC, and exercise form.  Person educated: Patient Education method: Explanation, Demonstration, Tactile cues, Verbal cues Education comprehension: verbalized understanding, returned demonstration, verbal cues required, tactile cues required, and needs further education   HOME EXERCISE PROGRAM: Access Code: [email or web address] URL: [email or web address] Date: 07/17/2021 Prepared by: Ronny Flurry     ASSESSMENT:  CLINICAL IMPRESSION: Pt is making progress with ROM, function, and tolerance to activity though continues to have significant muscle weakness.  He has made good progress with R shoulder elevation against gravity.  His compensatory shoulder hike with elevation has improved and is much less than what it was.  It worsens with fatigue and lack of concentration.  He has much improved shoulder hike with cuing and concentration.  Pt is able to perform standing scaption overhead though is very limited with abd in standing.  Pt denies any limitations with self care activities.  He also denies pain.  Pt is  increasing with  resistance with exercises per protocol.  He continues to have significant strength deficits in R shoulder.  He reports improved reaching though is still limited with overhead activities.  Pt has met all STG's and is progressing toward LTG's.  He should benefit from cont skilled PT services to address impairments and goals and to restore desired level of function.             OBJECTIVE IMPAIRMENTS decreased activity tolerance, decreased endurance, decreased ROM, decreased strength, hypomobility, impaired flexibility, and pain.   ACTIVITY LIMITATIONS carrying, lifting, bathing, dressing, reach over head, hygiene/grooming, and caring for others  PARTICIPATION LIMITATIONS: meal prep, cleaning, community activity, and yard work   [employer] POTENTIAL: Good  CLINICAL DECISION MAKING: Stable/uncomplicated  EVALUATION COMPLEXITY: Low   GOALS:  SHORT TERM GOALS:   Pt will be independent and compliant with HEP for improved ROM, pain, and function.  Baseline: Goal status: GOAL MET Target date: 08/27/2021   2.  Pt will tolerate R shoulder PROM per protocol without significant pain for improved mobility and stiffness Baseline:  Goal status: GOAL MET  Target date: 08/06/2021   3.  Pt will demo R shoulder PROM to be 140 deg in flexion and 30 deg in ER for improved mobility and stiffness.  Baseline:  Goal status: GOAL MET Target date: 08/27/2021   4.  Pt will wean out of sling per MD allowance without adverse effects.  Baseline:  Goal status: GOAL MET Target date: 08/27/2021    5.  Pt will tolerate the initiation of shoulder AAROM without adverse effects.  Baseline:  Goal status: GOAL MET Target date: 08/27/2021   6.  Pt will demo supine shoulder AAROM to be 120 deg in flexion and in 40 deg in ER for progression of AAROM and protocol Baseline:  Goal status:  GOAL MET Target date:  09/10/2021  7.  Pt will demo R shoulder flexion and scaption AROM to at least 100 deg in  standing without significant shoulder hike for improved UE elevation. Baseline:  Goal status: GOAL MET Target date:  10/01/2021  8.  Pt will be able to perform his self care activities with no > than minimal difficulty.   Baseline:  Goal status: GOAL MET Target date:  10/08/2021   LONG TERM GOALS: Target date: 01/07/2022     Pt will demo R shoulder AROM to be [facility] t/o for performance of ADLs and IADLs.  Baseline:  Goal status: 50% MET  2.  Pt will be able to perform his ADLs and IADLs without significant difficulty and pain. Baseline:  Goal status: PARTIALLY MET  3.   Pt will be able to perform his normal reaching and overhead activities without significant pain and limitation. Baseline:  Goal status: PROGRESSING  4.  Pt will demo at least 4/5 MMT strength t/o R shoulder for improved tolerance with and performance of daily activities, functional lifting, and performance of yard work.  Baseline:  Goal status: ONGOING  5.  Pt will be able to perform appropriate functional carrying and lifting without significant pain in order to perform IADLs and household chores. Baseline:  Goal status: PROGRESSING  6.  Pt will be independent with advanced HEP for improved shoulder strength and stability to assist with returning to desired level of function and performing car work and yard work. Baseline:  Goal status: PROGRESSING   PLAN: PT FREQUENCY:  2 times per week  PT DURATION: other: 6 weeks  PLANNED INTERVENTIONS: Therapeutic exercises, Therapeutic activity, Neuromuscular re-education,  Patient/Family education, Joint mobilization, Aquatic Therapy, Dry Needling, Electrical stimulation, Spinal mobilization, Cryotherapy, Moist heat, Taping, Ultrasound, Manual therapy, and Re-evaluation  PLAN FOR NEXT SESSION:   Cont per Dr. Eddie Dibbles Rotator Cuff Repair protocol.  Will use subscapularis repair protocol also.  Pt had supraspinatus, infraspinatus, and subscapularis repairs and biceps  tenodesis.         Selinda Michaels III PT, DPT 12/08/21 9:46 AM

## 2021-12-10 ENCOUNTER — Encounter (HOSPITAL_BASED_OUTPATIENT_CLINIC_OR_DEPARTMENT_OTHER): Payer: Self-pay | Admitting: Physical Therapy

## 2021-12-10 ENCOUNTER — Ambulatory Visit (HOSPITAL_BASED_OUTPATIENT_CLINIC_OR_DEPARTMENT_OTHER): Payer: Medicare PPO | Admitting: Physical Therapy

## 2021-12-10 DIAGNOSIS — M6281 Muscle weakness (generalized): Secondary | ICD-10-CM

## 2021-12-10 DIAGNOSIS — M25511 Pain in right shoulder: Secondary | ICD-10-CM

## 2021-12-10 DIAGNOSIS — M25611 Stiffness of right shoulder, not elsewhere classified: Secondary | ICD-10-CM

## 2021-12-10 NOTE — Therapy (Signed)
OUTPATIENT PHYSICAL THERAPY SHOULDER TREATMENT NOTE      Patient Name: Chad Manning MRN: [id number] DOB:03/28/1955, 66 y.o., male Today's Date: 12/10/2021     PT End of Session - 12/10/21 0857     Visit Number 32    Number of Visits 42    Date for PT Re-Evaluation 01/07/22    Authorization Type [employer]    PT Start Time 0850    PT Stop Time 0929    PT Time Calculation (min) 39 min    Activity Tolerance Patient tolerated treatment well    Behavior During Therapy WFL for tasks assessed/performed                  Past Medical History:  Diagnosis Date   Arthritis    Asthma    x 1 age 38   Hyperlipidemia    Right rotator cuff tear    Past Surgical History:  Procedure Laterality Date   COLONOSCOPY     JOINT REPLACEMENT     SHOULDER ARTHROSCOPY WITH ROTATOR CUFF REPAIR AND OPEN BICEPS TENODESIS Right 07/13/2021   Procedure: RIGHT SHOULDER ARTHROSCOPY WITH ROTATOR CUFF REPAIR AND BICEPS TENODESIS;  Surgeon: Vanetta Mulders, MD;  Location: [place];  Service: Orthopedics;  Laterality: Right;   TOTAL HIP ARTHROPLASTY     bilatera   Patient Active Problem List   Diagnosis Date Noted   Traumatic complete tear of right rotator cuff    Former smoker 12/14/2019   Acquired hallux rigidus of right foot 11/29/2018   Dyslipidemia 03/15/2018   Hyperglycemia 03/15/2018     REFERRING PROVIDER: Vanetta Mulders, MD   REFERRING DIAG: S46.011A (ICD-10-CM) - Traumatic complete tear of right rotator cuff, initial encounter   THERAPY DIAG:   Muscle weakness (generalized)  Stiffness of right shoulder, not elsewhere classified  Right shoulder pain, unspecified chronicity  Rationale for Evaluation and Treatment Rehabilitation  ONSET DATE: Injury 06/05/2021 ; DOS 07/13/2021  SUBJECTIVE:                                                                                                                                                                             SUBJECTIVE  STATEMENT: Pt is 21 weeks and 2 days s/p supraspinatus, infraspinatus, and subscapularis repairs, biceps tenodesis, and subacromial decompression.  Pt states he felt fine after prior Rx.  He denies pain currently.  Pt was able to reach in garage to get Christmas decorations.  Pt has improved reaching in the glove box in the car.  Pt states he is doing more now without thinking about it.        PERTINENT HISTORY: R shoulder arthroscopic rotator cuff repair of supraspinatus, infraspinatus, and subscapularis, biceps tenodesis, and subacromial  decompression on 07/13/2021.  MD message indicated subscapularis restrictions also.  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 No NPRS:  Current and best:  0/10 ; Worst:  2-3/10 Location:  R shoulder  PRECAUTIONS: Other: RCR of supraspinatus, infraspinatus, and subscapularis.  Hx of bilat THA  WEIGHT BEARING RESTRICTIONS   FALLS:  Has patient fallen in last 6 months? Yes. Number of falls 1, this injury  LIVING ENVIRONMENT: Lives with: lives with their spouse Lives in: 1 story home Stairs: 2 steps without rail to enter home   OCCUPATION: Pt is retired  PLOF: Independent; Pt was able to perform all of his ADLs/IADLs and reaching and overhead activities independently without limitation.  Pt was able to perform yard work and work on car without limitation.  Pt was able to pick up granddaughter.   PATIENT GOALS return to PLOF, have full ROM and strength, to be able to perform car work and yard work, pick up granddaughter     OBJECTIVE:   DIAGNOSTIC FINDINGS:  Pt had x rays and a MRI prior to surgery.    TODAY'S TREATMENT:   Therapeutic Exercise: -Reviewed current function, HEP compliance, pain level, and response to prior Rx. -Pt performed:     UBE at L2 x 5 mins (3' fwd/2' bwd)    Standing jobe's flexion 3x10 with 2#    Standing scaption AROM 3x10 with verbal and visual cuing to decrease shoulder  hike          Standing shoulder wall walks in abd x 10 reps    Shelf reach x10 with 1#, 2x10 with 2#     Standing wand IR 2x10     Functional reaching waist to shoulder with 3#  2x10    Neuro Re-ed Activities:    Standing D2 flexion 2x10    4D ball rolls on wall 2x10 each     Wall walks with YTB x 3 sets           PATIENT EDUCATION: Education details:  HEP, POC, and exercise form.  Person educated: Patient Education method: Explanation, Demonstration, Tactile cues, Verbal cues Education comprehension: verbalized understanding, returned demonstration, verbal cues required, tactile cues required, and needs further education   HOME EXERCISE PROGRAM: Access Code: [email or web address] URL: [email or web address] Date: 07/17/2021 Prepared by: Ronny Flurry     ASSESSMENT:  CLINICAL IMPRESSION: Pt continues to have limitations with elevation though is improving.  Pt demonstrates improved performance of elevation with increased focus on his form and control.  Pt requires cuing and instruction for form with UE elevation including to decrease shoulder compensatory hike though is improving.  He has R shoulder weakness though is improving with strength as evidenced by performance of exercises.  Pt demonstrates improved form with standing D2 flexion and was able to perform shelf reach with 2# weight.  Pt responded well to Rx having no pain during and after Rx.  He should benefit from cont skilled PT services to address impairments and goals and to restore desired level of function.             OBJECTIVE IMPAIRMENTS decreased activity tolerance, decreased endurance, decreased ROM, decreased strength, hypomobility, impaired flexibility, and pain.   ACTIVITY LIMITATIONS carrying, lifting, bathing, dressing, reach over head, hygiene/grooming, and caring for others  PARTICIPATION LIMITATIONS: meal prep, cleaning, community activity, and yard work   [employer] POTENTIAL: Good  CLINICAL DECISION  MAKING: Stable/uncomplicated  EVALUATION COMPLEXITY: Low  GOALS:  SHORT TERM GOALS:   Pt will be independent and compliant with HEP for improved ROM, pain, and function.  Baseline: Goal status: GOAL MET Target date: 08/27/2021   2.  Pt will tolerate R shoulder PROM per protocol without significant pain for improved mobility and stiffness Baseline:  Goal status: GOAL MET  Target date: 08/06/2021   3.  Pt will demo R shoulder PROM to be 140 deg in flexion and 30 deg in ER for improved mobility and stiffness.  Baseline:  Goal status: GOAL MET Target date: 08/27/2021   4.  Pt will wean out of sling per MD allowance without adverse effects.  Baseline:  Goal status: GOAL MET Target date: 08/27/2021    5.  Pt will tolerate the initiation of shoulder AAROM without adverse effects.  Baseline:  Goal status: GOAL MET Target date: 08/27/2021   6.  Pt will demo supine shoulder AAROM to be 120 deg in flexion and in 40 deg in ER for progression of AAROM and protocol Baseline:  Goal status:  GOAL MET Target date:  09/10/2021  7.  Pt will demo R shoulder flexion and scaption AROM to at least 100 deg in standing without significant shoulder hike for improved UE elevation. Baseline:  Goal status: GOAL MET Target date:  10/01/2021  8.  Pt will be able to perform his self care activities with no > than minimal difficulty.   Baseline:  Goal status: GOAL MET Target date:  10/08/2021   LONG TERM GOALS: Target date: 01/07/2022     Pt will demo R shoulder AROM to be [facility] t/o for performance of ADLs and IADLs.  Baseline:  Goal status: 50% MET  2.  Pt will be able to perform his ADLs and IADLs without significant difficulty and pain. Baseline:  Goal status: PARTIALLY MET  3.   Pt will be able to perform his normal reaching and overhead activities without significant pain and limitation. Baseline:  Goal status: PROGRESSING  4.  Pt will demo at least 4/5 MMT strength t/o R shoulder for  improved tolerance with and performance of daily activities, functional lifting, and performance of yard work.  Baseline:  Goal status: ONGOING  5.  Pt will be able to perform appropriate functional carrying and lifting without significant pain in order to perform IADLs and household chores. Baseline:  Goal status: PROGRESSING  6.  Pt will be independent with advanced HEP for improved shoulder strength and stability to assist with returning to desired level of function and performing car work and yard work. Baseline:  Goal status: PROGRESSING   PLAN: PT FREQUENCY:  2 times per week  PT DURATION: other: 6 weeks  PLANNED INTERVENTIONS: Therapeutic exercises, Therapeutic activity, Neuromuscular re-education, Patient/Family education, Joint mobilization, Aquatic Therapy, Dry Needling, Electrical stimulation, Spinal mobilization, Cryotherapy, Moist heat, Taping, Ultrasound, Manual therapy, and Re-evaluation  PLAN FOR NEXT SESSION:   Cont per Dr. Eddie Dibbles Rotator Cuff Repair protocol.  Will use subscapularis repair protocol also.  Pt had supraspinatus, infraspinatus, and subscapularis repairs and biceps tenodesis.  Try standing ABC next visit.     Selinda Michaels III PT, DPT 12/10/21 11:41 PM

## 2021-12-15 LAB — IRON,TIBC AND FERRITIN PANEL: Iron: 16.1

## 2021-12-15 LAB — LIPID PANEL: Cholesterol: 163 (ref 0–200)

## 2021-12-16 ENCOUNTER — Ambulatory Visit (HOSPITAL_BASED_OUTPATIENT_CLINIC_OR_DEPARTMENT_OTHER): Payer: Medicare PPO | Attending: Orthopaedic Surgery | Admitting: Physical Therapy

## 2021-12-16 ENCOUNTER — Encounter (HOSPITAL_BASED_OUTPATIENT_CLINIC_OR_DEPARTMENT_OTHER): Payer: Self-pay | Admitting: Physical Therapy

## 2021-12-16 DIAGNOSIS — M25611 Stiffness of right shoulder, not elsewhere classified: Secondary | ICD-10-CM | POA: Insufficient documentation

## 2021-12-16 DIAGNOSIS — M6281 Muscle weakness (generalized): Secondary | ICD-10-CM | POA: Diagnosis not present

## 2021-12-16 DIAGNOSIS — M25511 Pain in right shoulder: Secondary | ICD-10-CM | POA: Insufficient documentation

## 2021-12-16 NOTE — Therapy (Signed)
OUTPATIENT PHYSICAL THERAPY SHOULDER TREATMENT NOTE      Patient Name: Chad Manning MRN: [id number] DOB:1955/04/04, 66 y.o., male Today's Date: 12/17/2021     PT End of Session - 12/16/21 0946     Visit Number 33    Number of Visits 42    Date for PT Re-Evaluation 01/07/22    Authorization Type [employer]    PT Start Time 0940    PT Stop Time 1020    PT Time Calculation (min) 40 min    Activity Tolerance Patient tolerated treatment well    Behavior During Therapy WFL for tasks assessed/performed                  Past Medical History:  Diagnosis Date   Arthritis    Asthma    x 1 age 35   Hyperlipidemia    Right rotator cuff tear    Past Surgical History:  Procedure Laterality Date   COLONOSCOPY     JOINT REPLACEMENT     SHOULDER ARTHROSCOPY WITH ROTATOR CUFF REPAIR AND OPEN BICEPS TENODESIS Right 07/13/2021   Procedure: RIGHT SHOULDER ARTHROSCOPY WITH ROTATOR CUFF REPAIR AND BICEPS TENODESIS;  Surgeon: Vanetta Mulders, MD;  Location: [facility];  Service: Orthopedics;  Laterality: Right;   TOTAL HIP ARTHROPLASTY     bilatera   Patient Active Problem List   Diagnosis Date Noted   Traumatic complete tear of right rotator cuff    Former smoker 12/14/2019   Acquired hallux rigidus of right foot 11/29/2018   Dyslipidemia 03/15/2018   Hyperglycemia 03/15/2018     REFERRING PROVIDER: Vanetta Mulders, MD   REFERRING DIAG: S46.011A (ICD-10-CM) - Traumatic complete tear of right rotator cuff, initial encounter   THERAPY DIAG:   Muscle weakness (generalized)  Stiffness of right shoulder, not elsewhere classified  Right shoulder pain, unspecified chronicity  Rationale for Evaluation and Treatment Rehabilitation  ONSET DATE: Injury 06/05/2021 ; DOS 07/13/2021  SUBJECTIVE:                                                                                                                                                                             SUBJECTIVE  STATEMENT: Pt is 22 weeks and 2 days s/p supraspinatus, infraspinatus, and subscapularis repairs, biceps tenodesis, and subacromial decompression.  Pt states he felt fine after prior Rx.  He denies pain currently.  Pt reports he has improved ability with performing hair care.  Pt is able to blow leaves without pain.  Pt states he is doing more now without thinking about it.      PERTINENT HISTORY: R shoulder arthroscopic rotator cuff repair of supraspinatus, infraspinatus, and subscapularis, biceps tenodesis, and subacromial decompression on 07/13/2021.  MD message  indicated subscapularis restrictions also.  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 No NPRS:  Current and best:  0/10 ; Worst:  2-3/10 Location:  R shoulder  PRECAUTIONS: Other: RCR of supraspinatus, infraspinatus, and subscapularis.  Hx of bilat THA  WEIGHT BEARING RESTRICTIONS   FALLS:  Has patient fallen in last 6 months? Yes. Number of falls 1, this injury  LIVING ENVIRONMENT: Lives with: lives with their spouse Lives in: 1 story home Stairs: 2 steps without rail to enter home   OCCUPATION: Pt is retired  PLOF: Independent; Pt was able to perform all of his ADLs/IADLs and reaching and overhead activities independently without limitation.  Pt was able to perform yard work and work on car without limitation.  Pt was able to pick up granddaughter.   PATIENT GOALS return to PLOF, have full ROM and strength, to be able to perform car work and yard work, pick up granddaughter     OBJECTIVE:   DIAGNOSTIC FINDINGS:  Pt had x rays and a MRI prior to surgery.    TODAY'S TREATMENT:   Therapeutic Exercise: -Reviewed current function, HEP compliance, pain level, and response to prior Rx. -Pt performed:     UBE at L2 x 5 mins (3' fwd/2' bwd)    Standing jobe's flexion 3x10 with 2#    Standing scaption 2x10 with 1# verbal and visual cuing to decrease shoulder hike          Standing  shoulder wall walks in abd x 10 reps    Shelf reach 3x10 with 2#     Standing wand IR 2x10     Functional reaching waist to shoulder with 4#  2x10    Neuro Re-ed Activities:    Standing D2 flexion x10 AROM, x 10 with 1#    4D ball rolls on wall 2x10 each with 1.1 # ball    Wall walks with YTB x 3 sets    Shoulder ABC with ball on wall x 1 rep    Rhythmic stab's with P-ball on wall 2x20 sec           PATIENT EDUCATION: Education details:  HEP, POC, and exercise form.  Person educated: Patient Education method: Explanation, Demonstration, Tactile cues, Verbal cues Education comprehension: verbalized understanding, returned demonstration, verbal cues required, tactile cues required, and needs further education   HOME EXERCISE PROGRAM: Access Code: [email or web address] URL: [email or web address] Date: 07/17/2021 Prepared by: Ronny Flurry     ASSESSMENT:  CLINICAL IMPRESSION: Pt is improving with function as evidenced by subjective reports.  He is performing yard work and continues to report improved performance of hair care.  Pt continues to have compensatory shoulder hike which improves with concentration, decreasing speed, and increased control.  Pt has much improved form and control with UE elevation including decreased shoulder hike.  Pt was able to perform 1# with standing scaption with cuing.  Pt fatigues with wall exercises and requires cuing to keep elbow straight.  He has R shoulder weakness though is improving with strength as evidenced by performance of exercises.  Pt is highly motivated and gives great effort with exercises.  Pt responded well to Rx having no pain during and after Rx.  He should benefit from cont skilled PT services to address impairments and goals and to restore desired level of function.             OBJECTIVE IMPAIRMENTS decreased activity tolerance, decreased endurance, decreased  ROM, decreased strength, hypomobility, impaired flexibility, and pain.    ACTIVITY LIMITATIONS carrying, lifting, bathing, dressing, reach over head, hygiene/grooming, and caring for others  PARTICIPATION LIMITATIONS: meal prep, cleaning, community activity, and yard work   [employer] POTENTIAL: Good  CLINICAL DECISION MAKING: Stable/uncomplicated  EVALUATION COMPLEXITY: Low   GOALS:  SHORT TERM GOALS:   Pt will be independent and compliant with HEP for improved ROM, pain, and function.  Baseline: Goal status: GOAL MET Target date: 08/27/2021   2.  Pt will tolerate R shoulder PROM per protocol without significant pain for improved mobility and stiffness Baseline:  Goal status: GOAL MET  Target date: 08/06/2021   3.  Pt will demo R shoulder PROM to be 140 deg in flexion and 30 deg in ER for improved mobility and stiffness.  Baseline:  Goal status: GOAL MET Target date: 08/27/2021   4.  Pt will wean out of sling per MD allowance without adverse effects.  Baseline:  Goal status: GOAL MET Target date: 08/27/2021    5.  Pt will tolerate the initiation of shoulder AAROM without adverse effects.  Baseline:  Goal status: GOAL MET Target date: 08/27/2021   6.  Pt will demo supine shoulder AAROM to be 120 deg in flexion and in 40 deg in ER for progression of AAROM and protocol Baseline:  Goal status:  GOAL MET Target date:  09/10/2021  7.  Pt will demo R shoulder flexion and scaption AROM to at least 100 deg in standing without significant shoulder hike for improved UE elevation. Baseline:  Goal status: GOAL MET Target date:  10/01/2021  8.  Pt will be able to perform his self care activities with no > than minimal difficulty.   Baseline:  Goal status: GOAL MET Target date:  10/08/2021   LONG TERM GOALS: Target date: 01/07/2022     Pt will demo R shoulder AROM to be [facility] t/o for performance of ADLs and IADLs.  Baseline:  Goal status: 50% MET  2.  Pt will be able to perform his ADLs and IADLs without significant difficulty and pain. Baseline:   Goal status: PARTIALLY MET  3.   Pt will be able to perform his normal reaching and overhead activities without significant pain and limitation. Baseline:  Goal status: PROGRESSING  4.  Pt will demo at least 4/5 MMT strength t/o R shoulder for improved tolerance with and performance of daily activities, functional lifting, and performance of yard work.  Baseline:  Goal status: ONGOING  5.  Pt will be able to perform appropriate functional carrying and lifting without significant pain in order to perform IADLs and household chores. Baseline:  Goal status: PROGRESSING  6.  Pt will be independent with advanced HEP for improved shoulder strength and stability to assist with returning to desired level of function and performing car work and yard work. Baseline:  Goal status: PROGRESSING   PLAN: PT FREQUENCY:  2 times per week  PT DURATION: other: 6 weeks  PLANNED INTERVENTIONS: Therapeutic exercises, Therapeutic activity, Neuromuscular re-education, Patient/Family education, Joint mobilization, Aquatic Therapy, Dry Needling, Electrical stimulation, Spinal mobilization, Cryotherapy, Moist heat, Taping, Ultrasound, Manual therapy, and Re-evaluation  PLAN FOR NEXT SESSION:   Cont per Dr. Eddie Dibbles Rotator Cuff Repair protocol.  Will use subscapularis repair protocol also.  Pt had supraspinatus, infraspinatus, and subscapularis repairs and biceps tenodesis.  Try standing ABC next visit.     Selinda Michaels III PT, DPT 12/17/21 9:21 AM

## 2021-12-18 ENCOUNTER — Ambulatory Visit (HOSPITAL_BASED_OUTPATIENT_CLINIC_OR_DEPARTMENT_OTHER): Payer: Medicare PPO | Admitting: Physical Therapy

## 2021-12-18 ENCOUNTER — Encounter (HOSPITAL_BASED_OUTPATIENT_CLINIC_OR_DEPARTMENT_OTHER): Payer: Self-pay | Admitting: Physical Therapy

## 2021-12-18 DIAGNOSIS — M6281 Muscle weakness (generalized): Secondary | ICD-10-CM

## 2021-12-18 DIAGNOSIS — M25611 Stiffness of right shoulder, not elsewhere classified: Secondary | ICD-10-CM

## 2021-12-18 DIAGNOSIS — M25511 Pain in right shoulder: Secondary | ICD-10-CM

## 2021-12-18 NOTE — Therapy (Signed)
OUTPATIENT PHYSICAL THERAPY SHOULDER TREATMENT NOTE      Patient Name: Chad Manning MRN: [id number] DOB:[birth year], 66 y.o., male Today's Date: 12/18/2021     PT End of Session - 12/18/21 0850     Visit Number 34    Number of Visits 42    Date for PT Re-Evaluation 01/07/22    Authorization Type [employer]    PT Start Time 0849    PT Stop Time 0931    PT Time Calculation (min) 42 min    Activity Tolerance Patient tolerated treatment well    Behavior During Therapy WFL for tasks assessed/performed                  Past Medical History:  Diagnosis Date   Arthritis    Asthma    x 1 age 40   Hyperlipidemia    Right rotator cuff tear    Past Surgical History:  Procedure Laterality Date   COLONOSCOPY     JOINT REPLACEMENT     SHOULDER ARTHROSCOPY WITH ROTATOR CUFF REPAIR AND OPEN BICEPS TENODESIS Right 07/13/2021   Procedure: RIGHT SHOULDER ARTHROSCOPY WITH ROTATOR CUFF REPAIR AND BICEPS TENODESIS;  Surgeon: Vanetta Mulders, MD;  Location: [facility];  Service: Orthopedics;  Laterality: Right;   TOTAL HIP ARTHROPLASTY     bilatera   Patient Active Problem List   Diagnosis Date Noted   Traumatic complete tear of right rotator cuff    Former smoker 12/14/2019   Acquired hallux rigidus of right foot 11/29/2018   Dyslipidemia 03/15/2018   Hyperglycemia 03/15/2018     REFERRING PROVIDER: Vanetta Mulders, MD   REFERRING DIAG: S46.011A (ICD-10-CM) - Traumatic complete tear of right rotator cuff, initial encounter   THERAPY DIAG:   Muscle weakness (generalized)  Stiffness of right shoulder, not elsewhere classified  Right shoulder pain, unspecified chronicity  Rationale for Evaluation and Treatment Rehabilitation  ONSET DATE: Injury 06/05/2021 ; DOS 07/13/2021  SUBJECTIVE:                                                                                                                                                                             SUBJECTIVE  STATEMENT: Pt is 22 weeks and 4 days s/p supraspinatus, infraspinatus, and subscapularis repairs, biceps tenodesis, and subacromial decompression.  Pt states he felt fine after prior Rx.  He denies pain currently.  Pt is able to blow leaves without pain.  Pt states he is doing more now without thinking about it.  Pt states he was able to use his R UE with working on his wife's bumper.  Pt reports compliance with HEP.    PERTINENT HISTORY: R shoulder arthroscopic rotator cuff repair of supraspinatus, infraspinatus, and subscapularis,  biceps tenodesis, and subacromial decompression on 07/13/2021.  MD message indicated subscapularis restrictions also.  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 No NPRS:  Current and best:  0/10 ; Worst:  2-3/10 Location:  R shoulder  PRECAUTIONS: Other: RCR of supraspinatus, infraspinatus, and subscapularis.  Hx of bilat THA  WEIGHT BEARING RESTRICTIONS   FALLS:  Has patient fallen in last 6 months? Yes. Number of falls 1, this injury  LIVING ENVIRONMENT: Lives with: lives with their spouse Lives in: 1 story home Stairs: 2 steps without rail to enter home   OCCUPATION: Pt is retired  PLOF: Independent; Pt was able to perform all of his ADLs/IADLs and reaching and overhead activities independently without limitation.  Pt was able to perform yard work and work on car without limitation.  Pt was able to pick up granddaughter.   PATIENT GOALS return to PLOF, have full ROM and strength, to be able to perform car work and yard work, pick up granddaughter     OBJECTIVE:   DIAGNOSTIC FINDINGS:  Pt had x rays and a MRI prior to surgery.    TODAY'S TREATMENT:   Therapeutic Exercise: -Reviewed current function, HEP compliance, pain level, and response to prior Rx. -Pt performed:     UBE at L2 x 5 mins (3' fwd/2' bwd)    Standing jobe's flexion 3x10 with 2#    Standing scaption 2x10 with 1# verbal and visual cuing to  decrease shoulder hike          Standing shoulder wall walks in abd x 10 reps    Shelf reach 3x10 with 2#     Functional reaching waist to shoulder with 4# x 10, 5#  2x10    L shoulder AROM (standing):  Flex:  159, Scaption:  152, Abd:  130   L shoulder strength:  R:  flex and scaption unable to tolerate resistance.  L:  flexion:  4/5    Neuro Re-ed Activities:    Standing D2 flexion 2x10 AROM    4D ball rolls on wall 2x10 each with 1.1 # ball, attempted at scaption and Pt had difficulty    Wall walks with YTB x 3 sets    Shoulder ABC in standing x 1 rep    Rhythmic stab's with P-ball on wall 2x20 sec           PATIENT EDUCATION: Education details:  HEP, POC, and exercise form.  Person educated: Patient Education method: Explanation, Demonstration, Tactile cues, Verbal cues Education comprehension: verbalized understanding, returned demonstration, verbal cues required, tactile cues required, and needs further education   HOME EXERCISE PROGRAM: Access Code: [email or web address] URL: [email or web address] Date: 07/17/2021 Prepared by: Ronny Flurry     ASSESSMENT:  CLINICAL IMPRESSION: Pt continues to improve with functional usage of R UE and tolerance to activity.  PT assessed ROM and strength today.  He demonstrates much improved UE elevation AROM including in scaption and abduction.  Though he is improving with R shoulder strength, he continues to have significant strength deficits as evidenced by MMT today.  He was unable to tolerate resistance in flex and scaption.  Pt also has weakness in L shoulder flexion.  Pt has improved tolerance with exercises and is progressing with there ex and neuro re-ed activities per protocol.  He does continues to fatigue with exercises requiring cuing for correct form and to control exercises.  Pt responded well to Rx having no pain  during and after Rx.  He should benefit from cont skilled PT services to address impairments and goals and to  restore desired level of function.             OBJECTIVE IMPAIRMENTS decreased activity tolerance, decreased endurance, decreased ROM, decreased strength, hypomobility, impaired flexibility, and pain.   ACTIVITY LIMITATIONS carrying, lifting, bathing, dressing, reach over head, hygiene/grooming, and caring for others  PARTICIPATION LIMITATIONS: meal prep, cleaning, community activity, and yard work   [employer] POTENTIAL: Good  CLINICAL DECISION MAKING: Stable/uncomplicated  EVALUATION COMPLEXITY: Low   GOALS:  SHORT TERM GOALS:   Pt will be independent and compliant with HEP for improved ROM, pain, and function.  Baseline: Goal status: GOAL MET Target date: 08/27/2021   2.  Pt will tolerate R shoulder PROM per protocol without significant pain for improved mobility and stiffness Baseline:  Goal status: GOAL MET  Target date: 08/06/2021   3.  Pt will demo R shoulder PROM to be 140 deg in flexion and 30 deg in ER for improved mobility and stiffness.  Baseline:  Goal status: GOAL MET Target date: 08/27/2021   4.  Pt will wean out of sling per MD allowance without adverse effects.  Baseline:  Goal status: GOAL MET Target date: 08/27/2021    5.  Pt will tolerate the initiation of shoulder AAROM without adverse effects.  Baseline:  Goal status: GOAL MET Target date: 08/27/2021   6.  Pt will demo supine shoulder AAROM to be 120 deg in flexion and in 40 deg in ER for progression of AAROM and protocol Baseline:  Goal status:  GOAL MET Target date:  09/10/2021  7.  Pt will demo R shoulder flexion and scaption AROM to at least 100 deg in standing without significant shoulder hike for improved UE elevation. Baseline:  Goal status: GOAL MET Target date:  10/01/2021  8.  Pt will be able to perform his self care activities with no > than minimal difficulty.   Baseline:  Goal status: GOAL MET Target date:  10/08/2021   LONG TERM GOALS: Target date: 01/07/2022     Pt will demo R  shoulder AROM to be [facility] t/o for performance of ADLs and IADLs.  Baseline:  Goal status: 50% MET  2.  Pt will be able to perform his ADLs and IADLs without significant difficulty and pain. Baseline:  Goal status: PARTIALLY MET  3.   Pt will be able to perform his normal reaching and overhead activities without significant pain and limitation. Baseline:  Goal status: PROGRESSING  4.  Pt will demo at least 4/5 MMT strength t/o R shoulder for improved tolerance with and performance of daily activities, functional lifting, and performance of yard work.  Baseline:  Goal status: ONGOING  5.  Pt will be able to perform appropriate functional carrying and lifting without significant pain in order to perform IADLs and household chores. Baseline:  Goal status: PROGRESSING  6.  Pt will be independent with advanced HEP for improved shoulder strength and stability to assist with returning to desired level of function and performing car work and yard work. Baseline:  Goal status: PROGRESSING   PLAN: PT FREQUENCY:  2 times per week  PT DURATION: other: 6 weeks  PLANNED INTERVENTIONS: Therapeutic exercises, Therapeutic activity, Neuromuscular re-education, Patient/Family education, Joint mobilization, Aquatic Therapy, Dry Needling, Electrical stimulation, Spinal mobilization, Cryotherapy, Moist heat, Taping, Ultrasound, Manual therapy, and Re-evaluation  PLAN FOR NEXT SESSION:   Cont per Dr. Eddie Dibbles Rotator Cuff  Repair protocol.  Will use subscapularis repair protocol also.  Pt had supraspinatus, infraspinatus, and subscapularis repairs and biceps tenodesis.      Selinda Michaels III PT, DPT 12/18/21 5:32 PM

## 2021-12-22 ENCOUNTER — Ambulatory Visit (HOSPITAL_BASED_OUTPATIENT_CLINIC_OR_DEPARTMENT_OTHER): Payer: Medicare PPO | Admitting: Physical Therapy

## 2021-12-22 ENCOUNTER — Encounter (HOSPITAL_BASED_OUTPATIENT_CLINIC_OR_DEPARTMENT_OTHER): Payer: Self-pay | Admitting: Physical Therapy

## 2021-12-22 DIAGNOSIS — M25611 Stiffness of right shoulder, not elsewhere classified: Secondary | ICD-10-CM

## 2021-12-22 DIAGNOSIS — M6281 Muscle weakness (generalized): Secondary | ICD-10-CM

## 2021-12-22 DIAGNOSIS — M25511 Pain in right shoulder: Secondary | ICD-10-CM | POA: Diagnosis not present

## 2021-12-22 NOTE — Therapy (Signed)
OUTPATIENT PHYSICAL THERAPY SHOULDER TREATMENT NOTE      Patient Name: Chad Manning MRN: [id number] DOB:[birth year], 66 y.o., male Today's Date: 12/23/2021     PT End of Session - 12/22/21 0939     Visit Number 35    Number of Visits 42    Date for PT Re-Evaluation 01/07/22    Authorization Type [employer]    PT Start Time [phone]    PT Stop Time 1015    PT Time Calculation (min) 39 min    Activity Tolerance Patient tolerated treatment well    Behavior During Therapy WFL for tasks assessed/performed                  Past Medical History:  Diagnosis Date   Arthritis    Asthma    x 1 age 89   Hyperlipidemia    Right rotator cuff tear    Past Surgical History:  Procedure Laterality Date   COLONOSCOPY     JOINT REPLACEMENT     SHOULDER ARTHROSCOPY WITH ROTATOR CUFF REPAIR AND OPEN BICEPS TENODESIS Right 07/13/2021   Procedure: RIGHT SHOULDER ARTHROSCOPY WITH ROTATOR CUFF REPAIR AND BICEPS TENODESIS;  Surgeon: Vanetta Mulders, MD;  Location: [facility];  Service: Orthopedics;  Laterality: Right;   TOTAL HIP ARTHROPLASTY     bilatera   Patient Active Problem List   Diagnosis Date Noted   Traumatic complete tear of right rotator cuff    Former smoker 12/14/2019   Acquired hallux rigidus of right foot 11/29/2018   Dyslipidemia 03/15/2018   Hyperglycemia 03/15/2018     REFERRING PROVIDER: Vanetta Mulders, MD   REFERRING DIAG: S46.011A (ICD-10-CM) - Traumatic complete tear of right rotator cuff, initial encounter   THERAPY DIAG:   Muscle weakness (generalized)  Stiffness of right shoulder, not elsewhere classified  Right shoulder pain, unspecified chronicity  Rationale for Evaluation and Treatment Rehabilitation  ONSET DATE: Injury 06/05/2021 ; DOS 07/13/2021  SUBJECTIVE:                                                                                                                                                                             SUBJECTIVE  STATEMENT: Pt is 5 months s/p supraspinatus, infraspinatus, and subscapularis repairs, biceps tenodesis, and subacromial decompression.  Pt states he felt fine after prior Rx.  He denies pain currently.  Pt is able to blow leaves without pain.  Pt states he is doing more now without thinking about it.  Pt reports compliance with HEP.  Pt is unable to scrape his car with R UE.      PERTINENT HISTORY: R shoulder arthroscopic rotator cuff repair of supraspinatus, infraspinatus, and subscapularis, biceps tenodesis, and subacromial decompression on 07/13/2021.  MD message indicated subscapularis restrictions also.  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 No NPRS:  Current and best:  0/10 ; Worst:  2-3/10 Location:  R shoulder  PRECAUTIONS: Other: RCR of supraspinatus, infraspinatus, and subscapularis.  Hx of bilat THA  WEIGHT BEARING RESTRICTIONS   FALLS:  Has patient fallen in last 6 months? Yes. Number of falls 1, this injury  LIVING ENVIRONMENT: Lives with: lives with their spouse Lives in: 1 story home Stairs: 2 steps without rail to enter home   OCCUPATION: Pt is retired  PLOF: Independent; Pt was able to perform all of his ADLs/IADLs and reaching and overhead activities independently without limitation.  Pt was able to perform yard work and work on car without limitation.  Pt was able to pick up granddaughter.   PATIENT GOALS return to PLOF, have full ROM and strength, to be able to perform car work and yard work, pick up granddaughter     OBJECTIVE:   DIAGNOSTIC FINDINGS:  Pt had x rays and a MRI prior to surgery.    TODAY'S TREATMENT:   Therapeutic Exercise: -Reviewed current function, HEP compliance, pain level, and response to prior Rx. -Pt performed:     UBE at L2 x 5 mins (3' fwd/2' bwd)    Standing jobe's flexion 3x10 with 2#    Standing scaption 3x10 with 1# verbal and visual cuing to decrease shoulder hike          Standing  shoulder wall walks in abd x 12 reps    Shelf reach 3x10 with 2#     Functional reaching waist to shoulder with 5#  2x10    Seated ER with arm supported at 70 deg abd 3x10 with 2#    R shoulder AROM (standing):  Flex:  159, Scaption:  154, Abd:  130   R shoulder strength:  Flexion:  tolerated min resistance  L:  flexion:  4/5    Neuro Re-ed Activities:    4D ball rolls on wall 2x10 each with 1.1 # ball    Wall walks with YTB x 3 reps and x 2 reps    Shoulder ABC in standing x 1 rep               PATIENT EDUCATION: Education details:  HEP, POC, and exercise form.  Person educated: Patient Education method: Explanation, Demonstration, Tactile cues, Verbal cues Education comprehension: verbalized understanding, returned demonstration, verbal cues required, tactile cues required, and needs further education   HOME EXERCISE PROGRAM: Access Code: [email or web address] URL: [email or web address] Date: 07/17/2021 Prepared by: Ronny Flurry     ASSESSMENT:  CLINICAL IMPRESSION: Pt continues to have significant weakness in R UE though is improving with performance of strengthening and stabilization exercises.  Pt was able to tolerate min resistance in shoulder flexion MMT though was unable to tolerate any resistance last time.  He does continues to fatigue with exercises requiring cuing for correct form and to control exercises.  Pt requires cuing to slow down, control movement, and not use momentum with elevation exercises.  He improves with cuing and concentration.  Pt responded well to Rx having no c/o's and no pain after Rx.  He should benefit from cont skilled PT services to address impairments and goals and to restore desired level of function.               OBJECTIVE IMPAIRMENTS decreased activity tolerance, decreased endurance, decreased ROM,  decreased strength, hypomobility, impaired flexibility, and pain.   ACTIVITY LIMITATIONS carrying, lifting, bathing, dressing, reach over  head, hygiene/grooming, and caring for others  PARTICIPATION LIMITATIONS: meal prep, cleaning, community activity, and yard work   [employer] POTENTIAL: Good  CLINICAL DECISION MAKING: Stable/uncomplicated  EVALUATION COMPLEXITY: Low   GOALS:  SHORT TERM GOALS:   Pt will be independent and compliant with HEP for improved ROM, pain, and function.  Baseline: Goal status: GOAL MET Target date: 08/27/2021   2.  Pt will tolerate R shoulder PROM per protocol without significant pain for improved mobility and stiffness Baseline:  Goal status: GOAL MET  Target date: 08/06/2021   3.  Pt will demo R shoulder PROM to be 140 deg in flexion and 30 deg in ER for improved mobility and stiffness.  Baseline:  Goal status: GOAL MET Target date: 08/27/2021   4.  Pt will wean out of sling per MD allowance without adverse effects.  Baseline:  Goal status: GOAL MET Target date: 08/27/2021    5.  Pt will tolerate the initiation of shoulder AAROM without adverse effects.  Baseline:  Goal status: GOAL MET Target date: 08/27/2021   6.  Pt will demo supine shoulder AAROM to be 120 deg in flexion and in 40 deg in ER for progression of AAROM and protocol Baseline:  Goal status:  GOAL MET Target date:  09/10/2021  7.  Pt will demo R shoulder flexion and scaption AROM to at least 100 deg in standing without significant shoulder hike for improved UE elevation. Baseline:  Goal status: GOAL MET Target date:  10/01/2021  8.  Pt will be able to perform his self care activities with no > than minimal difficulty.   Baseline:  Goal status: GOAL MET Target date:  10/08/2021   LONG TERM GOALS: Target date: 01/07/2022     Pt will demo R shoulder AROM to be [facility] t/o for performance of ADLs and IADLs.  Baseline:  Goal status: 50% MET  2.  Pt will be able to perform his ADLs and IADLs without significant difficulty and pain. Baseline:  Goal status: PARTIALLY MET  3.   Pt will be able to perform his normal  reaching and overhead activities without significant pain and limitation. Baseline:  Goal status: PROGRESSING  4.  Pt will demo at least 4/5 MMT strength t/o R shoulder for improved tolerance with and performance of daily activities, functional lifting, and performance of yard work.  Baseline:  Goal status: ONGOING  5.  Pt will be able to perform appropriate functional carrying and lifting without significant pain in order to perform IADLs and household chores. Baseline:  Goal status: PROGRESSING  6.  Pt will be independent with advanced HEP for improved shoulder strength and stability to assist with returning to desired level of function and performing car work and yard work. Baseline:  Goal status: PROGRESSING   PLAN: PT FREQUENCY:  2 times per week  PT DURATION: other: 6 weeks  PLANNED INTERVENTIONS: Therapeutic exercises, Therapeutic activity, Neuromuscular re-education, Patient/Family education, Joint mobilization, Aquatic Therapy, Dry Needling, Electrical stimulation, Spinal mobilization, Cryotherapy, Moist heat, Taping, Ultrasound, Manual therapy, and Re-evaluation  PLAN FOR NEXT SESSION:   Cont per Dr. Eddie Dibbles Rotator Cuff Repair protocol.  Will use subscapularis repair protocol also.  Pt had supraspinatus, infraspinatus, and subscapularis repairs and biceps tenodesis.   Review HEP.    Selinda Michaels III PT, DPT 12/23/21 5:17 PM

## 2021-12-23 ENCOUNTER — Encounter (HOSPITAL_BASED_OUTPATIENT_CLINIC_OR_DEPARTMENT_OTHER): Payer: Medicare PPO | Admitting: Physical Therapy

## 2021-12-24 ENCOUNTER — Ambulatory Visit (HOSPITAL_BASED_OUTPATIENT_CLINIC_OR_DEPARTMENT_OTHER): Payer: Medicare PPO | Admitting: Physical Therapy

## 2021-12-24 ENCOUNTER — Encounter (HOSPITAL_BASED_OUTPATIENT_CLINIC_OR_DEPARTMENT_OTHER): Payer: Self-pay | Admitting: Physical Therapy

## 2021-12-24 DIAGNOSIS — M25511 Pain in right shoulder: Secondary | ICD-10-CM | POA: Diagnosis not present

## 2021-12-24 DIAGNOSIS — M25611 Stiffness of right shoulder, not elsewhere classified: Secondary | ICD-10-CM

## 2021-12-24 DIAGNOSIS — M6281 Muscle weakness (generalized): Secondary | ICD-10-CM | POA: Diagnosis not present

## 2021-12-24 NOTE — Therapy (Addendum)
OUTPATIENT PHYSICAL THERAPY SHOULDER TREATMENT NOTE      Patient Name: Chad Manning MRN: [id number] DOB:09-04-1955, 66 y.o., male Today's Date: 12/25/2021     PT End of Session - 12/24/21 1126     Visit Number 36    Number of Visits 42    Date for PT Re-Evaluation 01/07/22    Authorization Type [employer]    PT Start Time 1105    PT Stop Time 1144    PT Time Calculation (min) 39 min    Activity Tolerance Patient tolerated treatment well    Behavior During Therapy WFL for tasks assessed/performed                  Past Medical History:  Diagnosis Date   Arthritis    Asthma    x 1 age 67   Hyperlipidemia    Right rotator cuff tear    Past Surgical History:  Procedure Laterality Date   COLONOSCOPY     JOINT REPLACEMENT     SHOULDER ARTHROSCOPY WITH ROTATOR CUFF REPAIR AND OPEN BICEPS TENODESIS Right 07/13/2021   Procedure: RIGHT SHOULDER ARTHROSCOPY WITH ROTATOR CUFF REPAIR AND BICEPS TENODESIS;  Surgeon: Vanetta Mulders, MD;  Location: [facility];  Service: Orthopedics;  Laterality: Right;   TOTAL HIP ARTHROPLASTY     bilatera   Patient Active Problem List   Diagnosis Date Noted   Traumatic complete tear of right rotator cuff    Former smoker 12/14/2019   Acquired hallux rigidus of right foot 11/29/2018   Dyslipidemia 03/15/2018   Hyperglycemia 03/15/2018     REFERRING PROVIDER: Vanetta Mulders, MD   REFERRING DIAG: S46.011A (ICD-10-CM) - Traumatic complete tear of right rotator cuff, initial encounter   THERAPY DIAG:   Muscle weakness (generalized)  Stiffness of right shoulder, not elsewhere classified  Right shoulder pain, unspecified chronicity  Rationale for Evaluation and Treatment Rehabilitation  ONSET DATE: Injury 06/05/2021 ; DOS 07/13/2021  SUBJECTIVE:                                                                                                                                                                             SUBJECTIVE  STATEMENT: Pt is 5 months s/p supraspinatus, infraspinatus, and subscapularis repairs, biceps tenodesis, and subacromial decompression.  Pt states he felt fine after prior Rx.  He denies pain currently.  Pt states he is doing more now without thinking about it.  Pt reports compliance with HEP.  Pt is unable to scrape his car with R UE.      PERTINENT HISTORY: R shoulder arthroscopic rotator cuff repair of supraspinatus, infraspinatus, and subscapularis, biceps tenodesis, and subacromial decompression on 07/13/2021.  MD message indicated subscapularis restrictions also.  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 No NPRS:  Current and best:  0/10 ; Worst:  2-3/10 Location:  R shoulder  PRECAUTIONS: Other: RCR of supraspinatus, infraspinatus, and subscapularis.  Hx of bilat THA  WEIGHT BEARING RESTRICTIONS   FALLS:  Has patient fallen in last 6 months? Yes. Number of falls 1, this injury  LIVING ENVIRONMENT: Lives with: lives with their spouse Lives in: 1 story home Stairs: 2 steps without rail to enter home   OCCUPATION: Pt is retired  PLOF: Independent; Pt was able to perform all of his ADLs/IADLs and reaching and overhead activities independently without limitation.  Pt was able to perform yard work and work on car without limitation.  Pt was able to pick up granddaughter.   PATIENT GOALS return to PLOF, have full ROM and strength, to be able to perform car work and yard work, pick up granddaughter     OBJECTIVE:   DIAGNOSTIC FINDINGS:  Pt had x rays and a MRI prior to surgery.    TODAY'S TREATMENT:   Therapeutic Exercise: -Reviewed current function, HEP compliance, pain level, and response to prior Rx. -Pt performed:     UBE at L2 x 5 mins (3' fwd/2' bwd)    Standing jobe's flexion 1x10 with 2#, 2x10 with 3#    Standing scaption 3x10 with 1# verbal and visual cuing to decrease shoulder hike             Shelf reach 3x10 with  2#     Functional reaching waist to shoulder with 5#  2x10    Seated ER with arm supported at 70 deg abd 3x10 with 2#       Neuro Re-ed Activities:    Standing D2 flexion with 1# 3x10    4D ball rolls on wall 2x10 each with 1.1 # ball    Wall walks with YTB x 3 reps and x 2 reps    Shoulder ABC in standing x 1 rep               PATIENT EDUCATION: Education details:  HEP, POC, and exercise form.  Person educated: Patient Education method: Explanation, Demonstration, Tactile cues, Verbal cues Education comprehension: verbalized understanding, returned demonstration, verbal cues required, tactile cues required, and needs further education   HOME EXERCISE PROGRAM: Access Code: [email or web address] URL: [email or web address] Date: 07/17/2021 Prepared by: Ronny Flurry     ASSESSMENT:  CLINICAL IMPRESSION: Pt does notice minor improvements with reaching and performing daily activities.  He states he is able to perform most of his ADLs fine. Pt continues to have significant weakness in R UE though is improving with performance of strengthening and stabilization exercises.  P demonstrates improved form with standing D2 flexion and was able to perform standing flexion with 3#.  He does continues to fatigue with exercises requiring cuing for correct form and to control exercises.  Pt requires cuing to slow down, control movement, and not use momentum with elevation exercises.  He improves with cuing and concentration.  Pt responded well to Rx having no c/o's and no pain after Rx.  He should benefit from cont skilled PT services to address impairments and goals and to restore desired level of function.                   OBJECTIVE IMPAIRMENTS decreased activity tolerance, decreased endurance, decreased ROM, decreased strength, hypomobility, impaired flexibility, and pain.   ACTIVITY LIMITATIONS carrying,  lifting, bathing, dressing, reach over head, hygiene/grooming, and caring for  others  PARTICIPATION LIMITATIONS: meal prep, cleaning, community activity, and yard work   [employer] POTENTIAL: Good  CLINICAL DECISION MAKING: Stable/uncomplicated  EVALUATION COMPLEXITY: Low   GOALS:  SHORT TERM GOALS:   Pt will be independent and compliant with HEP for improved ROM, pain, and function.  Baseline: Goal status: GOAL MET Target date: 08/27/2021   2.  Pt will tolerate R shoulder PROM per protocol without significant pain for improved mobility and stiffness Baseline:  Goal status: GOAL MET  Target date: 08/06/2021   3.  Pt will demo R shoulder PROM to be 140 deg in flexion and 30 deg in ER for improved mobility and stiffness.  Baseline:  Goal status: GOAL MET Target date: 08/27/2021   4.  Pt will wean out of sling per MD allowance without adverse effects.  Baseline:  Goal status: GOAL MET Target date: 08/27/2021    5.  Pt will tolerate the initiation of shoulder AAROM without adverse effects.  Baseline:  Goal status: GOAL MET Target date: 08/27/2021   6.  Pt will demo supine shoulder AAROM to be 120 deg in flexion and in 40 deg in ER for progression of AAROM and protocol Baseline:  Goal status:  GOAL MET Target date:  09/10/2021  7.  Pt will demo R shoulder flexion and scaption AROM to at least 100 deg in standing without significant shoulder hike for improved UE elevation. Baseline:  Goal status: GOAL MET Target date:  10/01/2021  8.  Pt will be able to perform his self care activities with no > than minimal difficulty.   Baseline:  Goal status: GOAL MET Target date:  10/08/2021   LONG TERM GOALS: Target date: 01/07/2022     Pt will demo R shoulder AROM to be [facility] t/o for performance of ADLs and IADLs.  Baseline:  Goal status: 50% MET  2.  Pt will be able to perform his ADLs and IADLs without significant difficulty and pain. Baseline:  Goal status: PARTIALLY MET  3.   Pt will be able to perform his normal reaching and overhead activities  without significant pain and limitation. Baseline:  Goal status: PROGRESSING  4.  Pt will demo at least 4/5 MMT strength t/o R shoulder for improved tolerance with and performance of daily activities, functional lifting, and performance of yard work.  Baseline:  Goal status: ONGOING  5.  Pt will be able to perform appropriate functional carrying and lifting without significant pain in order to perform IADLs and household chores. Baseline:  Goal status: PROGRESSING  6.  Pt will be independent with advanced HEP for improved shoulder strength and stability to assist with returning to desired level of function and performing car work and yard work. Baseline:  Goal status: PROGRESSING   PLAN: PT FREQUENCY:  2 times per week  PT DURATION: other: 6 weeks  PLANNED INTERVENTIONS: Therapeutic exercises, Therapeutic activity, Neuromuscular re-education, Patient/Family education, Joint mobilization, Aquatic Therapy, Dry Needling, Electrical stimulation, Spinal mobilization, Cryotherapy, Moist heat, Taping, Ultrasound, Manual therapy, and Re-evaluation  PLAN FOR NEXT SESSION:   Cont per Dr. Eddie Dibbles Rotator Cuff Repair protocol.  Will use subscapularis repair protocol also.  Pt had supraspinatus, infraspinatus, and subscapularis repairs and biceps tenodesis.   Review HEP.  Assess strength next visit.     Selinda Michaels III PT, DPT 12/25/21 10:01 AM

## 2021-12-29 ENCOUNTER — Ambulatory Visit (HOSPITAL_BASED_OUTPATIENT_CLINIC_OR_DEPARTMENT_OTHER): Payer: Medicare PPO | Admitting: Physical Therapy

## 2021-12-29 DIAGNOSIS — M25611 Stiffness of right shoulder, not elsewhere classified: Secondary | ICD-10-CM | POA: Diagnosis not present

## 2021-12-29 DIAGNOSIS — M25511 Pain in right shoulder: Secondary | ICD-10-CM | POA: Diagnosis not present

## 2021-12-29 DIAGNOSIS — M6281 Muscle weakness (generalized): Secondary | ICD-10-CM

## 2021-12-29 NOTE — Therapy (Signed)
OUTPATIENT PHYSICAL THERAPY SHOULDER TREATMENT NOTE      Patient Name: Chad Manning MRN: [id number] DOB:05-24-1955, 66 y.o., male Today's Date: 12/30/2021     PT End of Session - 12/29/21 1106     Visit Number 37    Number of Visits 42    Date for PT Re-Evaluation 01/07/22    Authorization Type [employer]    PT Start Time 1025    PT Stop Time 1103    PT Time Calculation (min) 38 min    Activity Tolerance Patient tolerated treatment well    Behavior During Therapy WFL for tasks assessed/performed                   Past Medical History:  Diagnosis Date   Arthritis    Asthma    x 1 age 88   Hyperlipidemia    Right rotator cuff tear    Past Surgical History:  Procedure Laterality Date   COLONOSCOPY     JOINT REPLACEMENT     SHOULDER ARTHROSCOPY WITH ROTATOR CUFF REPAIR AND OPEN BICEPS TENODESIS Right 07/13/2021   Procedure: RIGHT SHOULDER ARTHROSCOPY WITH ROTATOR CUFF REPAIR AND BICEPS TENODESIS;  Surgeon: Vanetta Mulders, MD;  Location: [place];  Service: Orthopedics;  Laterality: Right;   TOTAL HIP ARTHROPLASTY     bilatera   Patient Active Problem List   Diagnosis Date Noted   Traumatic complete tear of right rotator cuff    Former smoker 12/14/2019   Acquired hallux rigidus of right foot 11/29/2018   Dyslipidemia 03/15/2018   Hyperglycemia 03/15/2018     REFERRING PROVIDER: Vanetta Mulders, MD   REFERRING DIAG: S46.011A (ICD-10-CM) - Traumatic complete tear of right rotator cuff, initial encounter   THERAPY DIAG:   Muscle weakness (generalized)  Stiffness of right shoulder, not elsewhere classified  Right shoulder pain, unspecified chronicity  Rationale for Evaluation and Treatment Rehabilitation  ONSET DATE: Injury 06/05/2021 ; DOS 07/13/2021  SUBJECTIVE:                                                                                                                                                                             SUBJECTIVE  STATEMENT: Pt is 5.5 months s/p supraspinatus, infraspinatus, and subscapularis repairs, biceps tenodesis, and subacromial decompression.  Pt states he has been washed out the last couple of days and has not performed as much of his HEP.  He states he's been traveling.  Pt reports improved reaching though reports no significant recent changes in functional status.  Pt states he felt fine after prior Rx.  He denies pain currently.  Pt is limited with lifting objects and states he can live with his shoulder the way it is right  now.     PERTINENT HISTORY: R shoulder arthroscopic rotator cuff repair of supraspinatus, infraspinatus, and subscapularis, biceps tenodesis, and subacromial decompression on 07/13/2021.  MD message indicated subscapularis restrictions also.  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 No NPRS:  Current and best:  0/10 ; Worst:  2-3/10 Location:  R shoulder  PRECAUTIONS: Other: RCR of supraspinatus, infraspinatus, and subscapularis.  Hx of bilat THA  WEIGHT BEARING RESTRICTIONS   FALLS:  Has patient fallen in last 6 months? Yes. Number of falls 1, this injury  LIVING ENVIRONMENT: Lives with: lives with their spouse Lives in: 1 story home Stairs: 2 steps without rail to enter home   OCCUPATION: Pt is retired  PLOF: Independent; Pt was able to perform all of his ADLs/IADLs and reaching and overhead activities independently without limitation.  Pt was able to perform yard work and work on car without limitation.  Pt was able to pick up granddaughter.   PATIENT GOALS return to PLOF, have full ROM and strength, to be able to perform car work and yard work, pick up granddaughter     OBJECTIVE:   DIAGNOSTIC FINDINGS:  Pt had x rays and a MRI prior to surgery.    TODAY'S TREATMENT:   Therapeutic Exercise: -Reviewed current function, HEP compliance, pain level, and response to prior Rx. -Pt performed:     UBE at L2 x 5 mins (3'  fwd/2' bwd)    Standing jobe's flexion 2x10 with 3#    Standing scaption 2x10 AROM verbal and visual cuing to decrease shoulder hike           Shelf reach 3x10 with 2#     Functional reaching waist to shoulder with 5#  2x10    Standing wall walks in abduction x 12 reps       R shoulder AROM (standing):  Flex:  159, Scaption:  154, Abd:  120                          R shoulder strength:  Flexion:  tolerated slight resistance        Attempted HHD with shoulder flexion though pt unable to perform on R.  L:  13.0 with HHD    Reviewed HEP.       Neuro Re-ed Activities:    Rhythmic stabs with p-ball on wall 3x30 sec    Wall walks with YTB x 3 reps and x 2 reps    Shoulder ABC in standing x 1 rep              PATIENT EDUCATION: Education details:  HEP, POC, objective findings, and exercise form.  Person educated: Patient Education method: Explanation, Demonstration, Tactile cues, Verbal cues Education comprehension: verbalized understanding, returned demonstration, verbal cues required, tactile cues required, and needs further education   HOME EXERCISE PROGRAM: Access Code: [email or web address] URL: [email or web address] Date: 07/17/2021 Prepared by: Ronny Flurry     ASSESSMENT:  CLINICAL IMPRESSION: Pt has good elevation AROM in standing with some limitations in abduction.  PT assessed strength today.  Pt continues to have significant weakness in R shoulder.  Pt only able to tolerate slight resistance with flexion and unable to perform HHD.  Pt unable to perform standing scaption with 1# with correct form.  PT had pt perform standing scaption without resistance.  Pt performed exercises well with cuing for control and speed of movement.  Pt has good ROM with shelf reach.  Pt responded well to Rx having no pain after Rx.  He should benefit from cont skilled PT services to improve strength, stability, and functional reaching and to restore desired level of function.           OBJECTIVE IMPAIRMENTS decreased activity tolerance, decreased endurance, decreased ROM, decreased strength, hypomobility, impaired flexibility, and pain.   ACTIVITY LIMITATIONS carrying, lifting, bathing, dressing, reach over head, hygiene/grooming, and caring for others  PARTICIPATION LIMITATIONS: meal prep, cleaning, community activity, and yard work   [employer] POTENTIAL: Good  CLINICAL DECISION MAKING: Stable/uncomplicated  EVALUATION COMPLEXITY: Low   GOALS:  SHORT TERM GOALS:   Pt will be independent and compliant with HEP for improved ROM, pain, and function.  Baseline: Goal status: GOAL MET Target date: 08/27/2021   2.  Pt will tolerate R shoulder PROM per protocol without significant pain for improved mobility and stiffness Baseline:  Goal status: GOAL MET  Target date: 08/06/2021   3.  Pt will demo R shoulder PROM to be 140 deg in flexion and 30 deg in ER for improved mobility and stiffness.  Baseline:  Goal status: GOAL MET Target date: 08/27/2021   4.  Pt will wean out of sling per MD allowance without adverse effects.  Baseline:  Goal status: GOAL MET Target date: 08/27/2021    5.  Pt will tolerate the initiation of shoulder AAROM without adverse effects.  Baseline:  Goal status: GOAL MET Target date: 08/27/2021   6.  Pt will demo supine shoulder AAROM to be 120 deg in flexion and in 40 deg in ER for progression of AAROM and protocol Baseline:  Goal status:  GOAL MET Target date:  09/10/2021  7.  Pt will demo R shoulder flexion and scaption AROM to at least 100 deg in standing without significant shoulder hike for improved UE elevation. Baseline:  Goal status: GOAL MET Target date:  10/01/2021  8.  Pt will be able to perform his self care activities with no > than minimal difficulty.   Baseline:  Goal status: GOAL MET Target date:  10/08/2021   LONG TERM GOALS: Target date: 01/07/2022     Pt will demo R shoulder AROM to be [facility] t/o for performance  of ADLs and IADLs.  Baseline:  Goal status: 50% MET  2.  Pt will be able to perform his ADLs and IADLs without significant difficulty and pain. Baseline:  Goal status: PARTIALLY MET  3.   Pt will be able to perform his normal reaching and overhead activities without significant pain and limitation. Baseline:  Goal status: PROGRESSING  4.  Pt will demo at least 4/5 MMT strength t/o R shoulder for improved tolerance with and performance of daily activities, functional lifting, and performance of yard work.  Baseline:  Goal status: ONGOING  5.  Pt will be able to perform appropriate functional carrying and lifting without significant pain in order to perform IADLs and household chores. Baseline:  Goal status: PROGRESSING  6.  Pt will be independent with advanced HEP for improved shoulder strength and stability to assist with returning to desired level of function and performing car work and yard work. Baseline:  Goal status: PROGRESSING   PLAN: PT FREQUENCY:  2 times per week  PT DURATION: other: 6 weeks  PLANNED INTERVENTIONS: Therapeutic exercises, Therapeutic activity, Neuromuscular re-education, Patient/Family education, Joint mobilization, Aquatic Therapy, Dry Needling, Electrical stimulation, Spinal mobilization, Cryotherapy, Moist heat, Taping, Ultrasound, Manual therapy, and Re-evaluation  PLAN FOR NEXT SESSION:   Cont per Dr. Eddie Dibbles Rotator Cuff Repair protocol.  Will use subscapularis repair protocol also.  Pt had supraspinatus, infraspinatus, and subscapularis repairs and biceps tenodesis.   Review HEP.  Assess ER strength next visit.     Selinda Michaels III PT, DPT 12/30/21 4:44 PM

## 2021-12-30 ENCOUNTER — Encounter (HOSPITAL_BASED_OUTPATIENT_CLINIC_OR_DEPARTMENT_OTHER): Payer: Medicare PPO | Admitting: Physical Therapy

## 2021-12-30 ENCOUNTER — Encounter (HOSPITAL_BASED_OUTPATIENT_CLINIC_OR_DEPARTMENT_OTHER): Payer: Self-pay | Admitting: Physical Therapy

## 2021-12-31 ENCOUNTER — Ambulatory Visit (HOSPITAL_BASED_OUTPATIENT_CLINIC_OR_DEPARTMENT_OTHER): Payer: Medicare PPO | Admitting: Physical Therapy

## 2021-12-31 ENCOUNTER — Encounter (HOSPITAL_BASED_OUTPATIENT_CLINIC_OR_DEPARTMENT_OTHER): Payer: Self-pay | Admitting: Physical Therapy

## 2021-12-31 DIAGNOSIS — M25511 Pain in right shoulder: Secondary | ICD-10-CM

## 2021-12-31 DIAGNOSIS — M25611 Stiffness of right shoulder, not elsewhere classified: Secondary | ICD-10-CM | POA: Diagnosis not present

## 2021-12-31 DIAGNOSIS — M6281 Muscle weakness (generalized): Secondary | ICD-10-CM

## 2021-12-31 NOTE — Therapy (Signed)
OUTPATIENT PHYSICAL THERAPY SHOULDER TREATMENT NOTE      Patient Name: Chad Manning MRN: [id number] DOB:[birth year], 66 y.o., male Today's Date: 01/01/2022     PT End of Session - 12/31/21 0938     Visit Number 38    Number of Visits 42    Date for PT Re-Evaluation 01/07/22    Authorization Type [employer]    PT Start Time [phone]    PT Stop Time 1014    PT Time Calculation (min) 40 min    Activity Tolerance Patient tolerated treatment well    Behavior During Therapy WFL for tasks assessed/performed                   Past Medical History:  Diagnosis Date   Arthritis    Asthma    x 1 age 52   Hyperlipidemia    Right rotator cuff tear    Past Surgical History:  Procedure Laterality Date   COLONOSCOPY     JOINT REPLACEMENT     SHOULDER ARTHROSCOPY WITH ROTATOR CUFF REPAIR AND OPEN BICEPS TENODESIS Right 07/13/2021   Procedure: RIGHT SHOULDER ARTHROSCOPY WITH ROTATOR CUFF REPAIR AND BICEPS TENODESIS;  Surgeon: Vanetta Mulders, MD;  Location: [place];  Service: Orthopedics;  Laterality: Right;   TOTAL HIP ARTHROPLASTY     bilatera   Patient Active Problem List   Diagnosis Date Noted   Traumatic complete tear of right rotator cuff    Former smoker 12/14/2019   Acquired hallux rigidus of right foot 11/29/2018   Dyslipidemia 03/15/2018   Hyperglycemia 03/15/2018     REFERRING PROVIDER: Vanetta Mulders, MD   REFERRING DIAG: S46.011A (ICD-10-CM) - Traumatic complete tear of right rotator cuff, initial encounter   THERAPY DIAG:   Muscle weakness (generalized)  Stiffness of right shoulder, not elsewhere classified  Right shoulder pain, unspecified chronicity  Rationale for Evaluation and Treatment Rehabilitation  ONSET DATE: Injury 06/05/2021 ; DOS 07/13/2021  SUBJECTIVE:                                                                                                                                                                             SUBJECTIVE  STATEMENT: Pt is 5.5 months s/p supraspinatus, infraspinatus, and subscapularis repairs, biceps tenodesis, and subacromial decompression.  Pt states the L side of his back is bothering him some, but not from anything PT is doing.  Pt reports improved reaching though reports no significant recent changes in functional status.  Pt states he felt fine after prior Rx.  He denies pain currently.  Pt is limited with lifting objects and states he can live with his shoulder the way it is right now.     PERTINENT HISTORY: R  shoulder arthroscopic rotator cuff repair of supraspinatus, infraspinatus, and subscapularis, biceps tenodesis, and subacromial decompression on 07/13/2021.  MD message indicated subscapularis restrictions also.  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 No NPRS:  Current and best:  0/10 ; Worst:  2-3/10 Location:  R shoulder  PRECAUTIONS: Other: RCR of supraspinatus, infraspinatus, and subscapularis.  Hx of bilat THA  WEIGHT BEARING RESTRICTIONS   FALLS:  Has patient fallen in last 6 months? Yes. Number of falls 1, this injury  LIVING ENVIRONMENT: Lives with: lives with their spouse Lives in: 1 story home Stairs: 2 steps without rail to enter home   OCCUPATION: Pt is retired  PLOF: Independent; Pt was able to perform all of his ADLs/IADLs and reaching and overhead activities independently without limitation.  Pt was able to perform yard work and work on car without limitation.  Pt was able to pick up granddaughter.   PATIENT GOALS return to PLOF, have full ROM and strength, to be able to perform car work and yard work, pick up granddaughter     OBJECTIVE:   DIAGNOSTIC FINDINGS:  Pt had x rays and a MRI prior to surgery.    TODAY'S TREATMENT:   Therapeutic Exercise: -Reviewed current function, HEP compliance, pain level, and response to prior Rx. -Pt performed:     UBE at L2 x 5 mins (2.5' fwd/bwd)    Standing jobe's flexion  with 2# 2 sets of 5-7    Standing scaption 2x5-8 AROM    Shelf reach 2x10     Standing wall walks in abduction x 12 reps    Seated ER with arm at approx 60-70 deg with 2# 3x10    Reviewed HEP and educated pt in progression of HEP.       Neuro Re-ed Activities:    4D ball rolls on wall 2x10    Scap protraction/retraction with hands on wall     Shoulder ABC in standing x 1 rep              PATIENT EDUCATION: Education details:  HEP, POC, objective findings, and exercise form.  Person educated: Patient Education method: Explanation, Demonstration, Tactile cues, Verbal cues Education comprehension: verbalized understanding, returned demonstration, verbal cues required, tactile cues required, and needs further education   HOME EXERCISE PROGRAM: Access Code: [email or web address] URL: [email or web address] Date: 07/17/2021 Prepared by: Ronny Flurry     ASSESSMENT:  CLINICAL IMPRESSION: Pt performed neuro re-ed activities first today and had increased difficulty with standing weighted elevation exercises.  Pt did not want to use L UE with standing DB exercises due to his L side of his lumbar bothering him.  Pt was fatigued after neuro re-ed activities and was unable to perform standing flexion and scaption with weight and also standing D2 flexion well.  PT decreased resistance with standing exercises due to decreased ability of performing exercises.  Pt's R shoulder quickly fatigues with exercises which decreases his form.  PT reviewed HEP and instructed pt in which exercises he can stop doing and which ones to progress.  Pt had no pain after Rx.          OBJECTIVE IMPAIRMENTS decreased activity tolerance, decreased endurance, decreased ROM, decreased strength, hypomobility, impaired flexibility, and pain.   ACTIVITY LIMITATIONS carrying, lifting, bathing, dressing, reach over head, hygiene/grooming, and caring for others  PARTICIPATION LIMITATIONS: meal prep, cleaning,  community activity, and yard work   [employer] POTENTIAL: Good  CLINICAL  DECISION MAKING: Stable/uncomplicated  EVALUATION COMPLEXITY: Low   GOALS:  SHORT TERM GOALS:   Pt will be independent and compliant with HEP for improved ROM, pain, and function.  Baseline: Goal status: GOAL MET Target date: 08/27/2021   2.  Pt will tolerate R shoulder PROM per protocol without significant pain for improved mobility and stiffness Baseline:  Goal status: GOAL MET  Target date: 08/06/2021   3.  Pt will demo R shoulder PROM to be 140 deg in flexion and 30 deg in ER for improved mobility and stiffness.  Baseline:  Goal status: GOAL MET Target date: 08/27/2021   4.  Pt will wean out of sling per MD allowance without adverse effects.  Baseline:  Goal status: GOAL MET Target date: 08/27/2021    5.  Pt will tolerate the initiation of shoulder AAROM without adverse effects.  Baseline:  Goal status: GOAL MET Target date: 08/27/2021   6.  Pt will demo supine shoulder AAROM to be 120 deg in flexion and in 40 deg in ER for progression of AAROM and protocol Baseline:  Goal status:  GOAL MET Target date:  09/10/2021  7.  Pt will demo R shoulder flexion and scaption AROM to at least 100 deg in standing without significant shoulder hike for improved UE elevation. Baseline:  Goal status: GOAL MET Target date:  10/01/2021  8.  Pt will be able to perform his self care activities with no > than minimal difficulty.   Baseline:  Goal status: GOAL MET Target date:  10/08/2021   LONG TERM GOALS: Target date: 01/07/2022     Pt will demo R shoulder AROM to be [facility] t/o for performance of ADLs and IADLs.  Baseline:  Goal status: 50% MET  2.  Pt will be able to perform his ADLs and IADLs without significant difficulty and pain. Baseline:  Goal status: PARTIALLY MET  3.   Pt will be able to perform his normal reaching and overhead activities without significant pain and limitation. Baseline:  Goal  status: PROGRESSING  4.  Pt will demo at least 4/5 MMT strength t/o R shoulder for improved tolerance with and performance of daily activities, functional lifting, and performance of yard work.  Baseline:  Goal status: ONGOING  5.  Pt will be able to perform appropriate functional carrying and lifting without significant pain in order to perform IADLs and household chores. Baseline:  Goal status: PROGRESSING  6.  Pt will be independent with advanced HEP for improved shoulder strength and stability to assist with returning to desired level of function and performing car work and yard work. Baseline:  Goal status: PROGRESSING   PLAN: PT FREQUENCY:  2 times per week  PT DURATION: other: 6 weeks  PLANNED INTERVENTIONS: Therapeutic exercises, Therapeutic activity, Neuromuscular re-education, Patient/Family education, Joint mobilization, Aquatic Therapy, Dry Needling, Electrical stimulation, Spinal mobilization, Cryotherapy, Moist heat, Taping, Ultrasound, Manual therapy, and Re-evaluation  PLAN FOR NEXT SESSION:   Cont per Dr. Eddie Dibbles Rotator Cuff Repair protocol.  Will use subscapularis repair protocol also.  Pt had supraspinatus, infraspinatus, and subscapularis repairs and biceps tenodesis.   Review HEP.  Assess ER strength next visit.     Selinda Michaels III PT, DPT 01/01/22 9:54 AM

## 2022-01-05 ENCOUNTER — Ambulatory Visit (HOSPITAL_BASED_OUTPATIENT_CLINIC_OR_DEPARTMENT_OTHER): Payer: Medicare PPO | Admitting: Physical Therapy

## 2022-01-06 ENCOUNTER — Encounter (HOSPITAL_BASED_OUTPATIENT_CLINIC_OR_DEPARTMENT_OTHER): Payer: Medicare PPO | Admitting: Physical Therapy

## 2022-01-06 NOTE — Therapy (Addendum)
OUTPATIENT PHYSICAL THERAPY SHOULDER TREATMENT NOTE / PROGRESS NOTE / [place]     Patient Name: Chad Manning MRN: [id number] DOB:Oct 10, 1955, 66 y.o., male Today's Date: 01/08/2022     PT End of Session - 01/07/22 1006     Visit Number 39    Number of Visits 43    Date for PT Re-Evaluation 02/04/22    Authorization Type [employer]    PT Start Time 0935    PT Stop Time 1018    PT Time Calculation (min) 43 min    Activity Tolerance Patient tolerated treatment well    Behavior During Therapy WFL for tasks assessed/performed                    Past Medical History:  Diagnosis Date   Arthritis    Asthma    x 1 age 22   Hyperlipidemia    Right rotator cuff tear    Past Surgical History:  Procedure Laterality Date   COLONOSCOPY     JOINT REPLACEMENT     SHOULDER ARTHROSCOPY WITH ROTATOR CUFF REPAIR AND OPEN BICEPS TENODESIS Right 07/13/2021   Procedure: RIGHT SHOULDER ARTHROSCOPY WITH ROTATOR CUFF REPAIR AND BICEPS TENODESIS;  Surgeon: Vanetta Mulders, MD;  Location: [facility];  Service: Orthopedics;  Laterality: Right;   TOTAL HIP ARTHROPLASTY     bilatera   Patient Active Problem List   Diagnosis Date Noted   Traumatic complete tear of right rotator cuff    Former smoker 12/14/2019   Acquired hallux rigidus of right foot 11/29/2018   Dyslipidemia 03/15/2018   Hyperglycemia 03/15/2018     REFERRING PROVIDER: Vanetta Mulders, MD   REFERRING DIAG: S46.011A (ICD-10-CM) - Traumatic complete tear of right rotator cuff, initial encounter   THERAPY DIAG:   Muscle weakness (generalized)  Stiffness of right shoulder, not elsewhere classified  Right shoulder pain, unspecified chronicity  Rationale for Evaluation and Treatment Rehabilitation  ONSET DATE: Injury 06/05/2021 ; DOS 07/13/2021  SUBJECTIVE:                                                                                                                                                                              SUBJECTIVE STATEMENT: Pt is over 5.5 months s/p supraspinatus, infraspinatus, and subscapularis repairs, biceps tenodesis, and subacromial decompression.   Pt states he felt fine after prior Rx.  He denies pain currently.  Pt reports no difficulty with self care activities.  Pt has no significant limitations or difficulty with reaching activities.  Pt states he is able to work on his cars now and is at 94% of normal level of working on cars.  "I'm not as strong as I once was, but I can  do what I need to do." Pt has limitations with lifting though states he is able to perform his functional lifting.     Pt states his L shoulder is bothering him some.    PERTINENT HISTORY: R shoulder arthroscopic rotator cuff repair of supraspinatus, infraspinatus, and subscapularis, biceps tenodesis, and subacromial decompression on 07/13/2021.  MD message indicated subscapularis restrictions also.  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 No NPRS:  Current and best:  0/10 ; Worst:  0/10 Location:  R shoulder  PRECAUTIONS: Other: RCR of supraspinatus, infraspinatus, and subscapularis.  Hx of bilat THA  WEIGHT BEARING RESTRICTIONS   FALLS:  Has patient fallen in last 6 months? Yes. Number of falls 1, this injury  LIVING ENVIRONMENT: Lives with: lives with their spouse Lives in: 1 story home Stairs: 2 steps without rail to enter home   OCCUPATION: Pt is retired  PLOF: Independent; Pt was able to perform all of his ADLs/IADLs and reaching and overhead activities independently without limitation.  Pt was able to perform yard work and work on car without limitation.  Pt was able to pick up granddaughter.   PATIENT GOALS return to PLOF, have full ROM and strength, to be able to perform car work and yard work, pick up granddaughter     OBJECTIVE:   DIAGNOSTIC FINDINGS:  Pt had x rays and a MRI prior to surgery.    TODAY'S TREATMENT:   Therapeutic  Exercise: -Reviewed current function, HEP compliance, pain level, and response to prior Rx.  -R shoulder AROM (standing):  Flex:  161           Scaption:  155           Abd:  134           ER: 87           IR:  52   -Functional IR AROM:  R:  T10, L:  3   -R shoulder strength:  Flexion:  MMT:  R:  tolerated min resistance,  L:  flexion:  4/5                     HHD:  R: 7.4, L:  18.4      Scaption:  R:  Unable to tolerate resistance, L:  5/5      ER:  4+/5      IR:  unable to tolerate resistance  -UEFI:  74/80   -Pt performed:     UBE at L2 x 5 mins (2.5' fwd/bwd)    Standing Cable Rows 10# 2x10-12    Standing Cable shoulder extension 10# 2x10             PATIENT EDUCATION: Education details:  Educated pt concerning objective findings and progress in comparison to prior findings.  Educated pt concerning POC and PT answered Pt's questions.  Exercise form.  Person educated: Patient Education method: Explanation, Demonstration, Tactile cues, Verbal cues Education comprehension: verbalized understanding, returned demonstration, verbal cues required, tactile cues required, and needs further education   HOME EXERCISE PROGRAM: Access Code: [email or web address] URL: [email or web address] Date: 07/17/2021 Prepared by: Ronny Flurry     ASSESSMENT:  CLINICAL IMPRESSION: Pt has made great progress functionally as evidenced by subjective reports.  He states he has no limitations with ADLs and self care activities.  He reports no significant limitations with reaching activities.  He is able to perform yard work and  is at 94% of his normal level with working on cars.  He is not having any pain.  Pt is able to elevate R UE well overhead and has good AROM t/o R shoulder.  Pt continues to have significant weakness in flexion, scaption, and IR.  Pt unable to tolerate any resistance in scaption and IR.  Though he has significant weakness, he is progressing with strengthening exercises  with increased resistance.  Pt has met all goals except LTG's # 4,6.  Pt should benefit from continued skilled PT services per protocol to improve strength and to establish long term HEP to maximize strength and stability.        OBJECTIVE IMPAIRMENTS decreased activity tolerance, decreased endurance, decreased ROM, decreased strength, hypomobility, impaired flexibility, and pain.   ACTIVITY LIMITATIONS carrying, lifting, bathing, dressing, reach over head, hygiene/grooming, and caring for others  PARTICIPATION LIMITATIONS: meal prep, cleaning, community activity, and yard work   [employer] POTENTIAL: Good  CLINICAL DECISION MAKING: Stable/uncomplicated  EVALUATION COMPLEXITY: Low   GOALS:  SHORT TERM GOALS:   Pt will be independent and compliant with HEP for improved ROM, pain, and function.  Baseline: Goal status: GOAL MET Target date: 08/27/2021   2.  Pt will tolerate R shoulder PROM per protocol without significant pain for improved mobility and stiffness Baseline:  Goal status: GOAL MET  Target date: 08/06/2021   3.  Pt will demo R shoulder PROM to be 140 deg in flexion and 30 deg in ER for improved mobility and stiffness.  Baseline:  Goal status: GOAL MET Target date: 08/27/2021   4.  Pt will wean out of sling per MD allowance without adverse effects.  Baseline:  Goal status: GOAL MET Target date: 08/27/2021    5.  Pt will tolerate the initiation of shoulder AAROM without adverse effects.  Baseline:  Goal status: GOAL MET Target date: 08/27/2021   6.  Pt will demo supine shoulder AAROM to be 120 deg in flexion and in 40 deg in ER for progression of AAROM and protocol Baseline:  Goal status:  GOAL MET Target date:  09/10/2021  7.  Pt will demo R shoulder flexion and scaption AROM to at least 100 deg in standing without significant shoulder hike for improved UE elevation. Baseline:  Goal status: GOAL MET Target date:  10/01/2021  8.  Pt will be able to perform his  self care activities with no > than minimal difficulty.   Baseline:  Goal status: GOAL MET Target date:  10/08/2021   LONG TERM GOALS: Target date: 01/07/2022     Pt will demo R shoulder AROM to be [facility] t/o for performance of ADLs and IADLs.  Baseline:  Goal status: GOAL MET  2.  Pt will be able to perform his ADLs and IADLs without significant difficulty and pain. Baseline:  Goal status: GOAL MET  3.   Pt will be able to perform his normal reaching and overhead activities without significant pain and limitation. Baseline:  Goal status: GOAL MET  4.  Pt will demo at least 4/5 MMT strength t/o R shoulder for improved tolerance with and performance of daily activities, functional lifting, and performance of yard work.  Baseline:  Goal status: ONGOING Target date: 02/04/22  5.  Pt will be able to perform appropriate functional carrying and lifting without significant pain in order to perform IADLs and household chores. Baseline:  Goal status: GOAL MET  6.  Pt will be independent with advanced HEP for improved shoulder  strength and stability to assist with returning to desired level of function and performing car work and yard work. Baseline:  Goal status: PROGRESSING Target date: 02/04/22    PLAN: PT FREQUENCY:  1 time per week  PT DURATION: other: 4 weeks  PLANNED INTERVENTIONS: Therapeutic exercises, Therapeutic activity, Neuromuscular re-education, Patient/Family education, Joint mobilization, Aquatic Therapy, Dry Needling, Electrical stimulation, Spinal mobilization, Cryotherapy, Moist heat, Taping, Ultrasound, Manual therapy, and Re-evaluation  PLAN FOR NEXT SESSION:   Cont per Dr. Eddie Dibbles Rotator Cuff Repair protocol.  Will use subscapularis repair protocol also.  Pt had supraspinatus, infraspinatus, and subscapularis repairs and biceps tenodesis.   Work on long term HEP.    Selinda Michaels III PT, DPT 01/08/22 9:33 AM

## 2022-01-07 ENCOUNTER — Ambulatory Visit (HOSPITAL_BASED_OUTPATIENT_CLINIC_OR_DEPARTMENT_OTHER): Payer: Medicare PPO | Admitting: Physical Therapy

## 2022-01-07 ENCOUNTER — Encounter (HOSPITAL_BASED_OUTPATIENT_CLINIC_OR_DEPARTMENT_OTHER): Payer: Self-pay | Admitting: Physical Therapy

## 2022-01-07 ENCOUNTER — Encounter (HOSPITAL_BASED_OUTPATIENT_CLINIC_OR_DEPARTMENT_OTHER): Payer: Medicare PPO | Admitting: Physical Therapy

## 2022-01-07 DIAGNOSIS — M25511 Pain in right shoulder: Secondary | ICD-10-CM | POA: Diagnosis not present

## 2022-01-07 DIAGNOSIS — M25611 Stiffness of right shoulder, not elsewhere classified: Secondary | ICD-10-CM | POA: Diagnosis not present

## 2022-01-07 DIAGNOSIS — M6281 Muscle weakness (generalized): Secondary | ICD-10-CM | POA: Diagnosis not present

## 2022-01-15 ENCOUNTER — Ambulatory Visit (INDEPENDENT_AMBULATORY_CARE_PROVIDER_SITE_OTHER): Payer: Medicare PPO | Admitting: Family Medicine

## 2022-01-15 ENCOUNTER — Encounter: Payer: Self-pay | Admitting: Family Medicine

## 2022-01-15 VITALS — BP 126/79 | HR 73 | Temp 97.8°F | Ht 70.0 in | Wt 211.8 lb

## 2022-01-15 DIAGNOSIS — E785 Hyperlipidemia, unspecified: Secondary | ICD-10-CM

## 2022-01-15 DIAGNOSIS — Z87891 Personal history of nicotine dependence: Secondary | ICD-10-CM | POA: Diagnosis not present

## 2022-01-15 DIAGNOSIS — Z0001 Encounter for general adult medical examination with abnormal findings: Secondary | ICD-10-CM | POA: Diagnosis not present

## 2022-01-15 DIAGNOSIS — Z125 Encounter for screening for malignant neoplasm of prostate: Secondary | ICD-10-CM

## 2022-01-15 DIAGNOSIS — R739 Hyperglycemia, unspecified: Secondary | ICD-10-CM

## 2022-01-15 NOTE — Assessment & Plan Note (Addendum)
Gets yearly lung cancer screening.  Has a scan next month.

## 2022-01-15 NOTE — Assessment & Plan Note (Signed)
Doing well on Lipitor 40 mg daily.  Check labs.

## 2022-01-15 NOTE — Assessment & Plan Note (Signed)
Check A1c. 

## 2022-01-15 NOTE — Progress Notes (Signed)
Chief Complaint:  Chad Manning is a 67 y.o. male who presents today for his annual comprehensive physical exam.    Assessment/Plan:  Chronic Problems Addressed Today: Dyslipidemia Doing well on Lipitor 40 mg daily.  Check labs.  Hyperglycemia Check A1c.  Former smoker Gets yearly lung cancer screening.  Has a scan next month.  Preventative Healthcare: Check labs.  Up-to-date on colon cancer screening.  Declined shingles and pneumonia vaccine.  Patient Counseling(The following topics were reviewed and/or handout was given):  -Nutrition: Stressed importance of moderation in sodium/caffeine intake, saturated fat and cholesterol, caloric balance, sufficient intake of fresh fruits, vegetables, and fiber.  -Stressed the importance of regular exercise.   -Substance Abuse: Discussed cessation/primary prevention of tobacco, alcohol, or other drug use; driving or other dangerous activities under the influence; availability of treatment for abuse.   -Injury prevention: Discussed safety belts, safety helmets, smoke detector, smoking near bedding or upholstery.   -Sexuality: Discussed sexually transmitted diseases, partner selection, use of condoms, avoidance of unintended pregnancy and contraceptive alternatives.   -Dental health: Discussed importance of regular tooth brushing, flossing, and dental visits.  -Health maintenance and immunizations reviewed. Please refer to Health maintenance section.  Return to care in 1 year for next preventative visit.     Subjective:  HPI:  Chad Manning has no acute complaints today. See Ap/p for status of chronic conditions.   Lifestyle Diet: Balanced. Plenty of fruits and vegetables.  Exercise: Physically active around the house.      01/15/2022    8:37 AM  Depression screen PHQ 2/9  Decreased Interest 0  Down, Depressed, Hopeless 0  PHQ - 2 Score 0    Health Maintenance Due  Topic Date Due   Medicare Annual Wellness (AWV)  Never done     ROS:  Per HPI, otherwise a complete review of systems was negative.   PMH:  The following were reviewed and entered/updated in epic: Past Medical History:  Diagnosis Date   Arthritis    Asthma    x 1 age 33   Hyperlipidemia    Right rotator cuff tear    Patient Active Problem List   Diagnosis Date Noted   Traumatic complete tear of right rotator cuff    Former smoker 12/14/2019   Acquired hallux rigidus of right foot 11/29/2018   Dyslipidemia 03/15/2018   Hyperglycemia 03/15/2018   Past Surgical History:  Procedure Laterality Date   COLONOSCOPY     JOINT REPLACEMENT     SHOULDER ARTHROSCOPY WITH ROTATOR CUFF REPAIR AND OPEN BICEPS TENODESIS Right 07/13/2021   Procedure: RIGHT SHOULDER ARTHROSCOPY WITH ROTATOR CUFF REPAIR AND BICEPS TENODESIS;  Surgeon: Vanetta Mulders, MD;  Location: [place];  Service: Orthopedics;  Laterality: Right;   TOTAL HIP ARTHROPLASTY     bilatera    Family History  Problem Relation Age of Onset   Heart disease Father    Cancer Mother    Lung cancer Sister        smoker   Colon cancer Neg Hx    Prostate cancer Neg Hx    Esophageal cancer Neg Hx    Rectal cancer Neg Hx     Medications- reviewed and updated Current Outpatient Medications  Medication Sig Dispense Refill   acetaminophen (TYLENOL) 500 MG tablet Take 500 mg by mouth every 6 (six) hours as needed.     AMBULATORY NON FORMULARY MEDICATION Lift chair.  Dispense 1.  R26.81. 1 each 0   amoxicillin (AMOXIL) 500 MG capsule Take 4  capsules 1 hour prior to dental appointment. 16 capsule 1   aspirin EC 325 MG tablet Take 1 tablet (325 mg total) by mouth daily. 30 tablet 0   atorvastatin (LIPITOR) 40 MG tablet TAKE 1 TABLET BY MOUTH DAILY 90 tablet 1   diclofenac Sodium (VOLTAREN) 1 % GEL Apply 1 Application topically 4 (four) times daily as needed (knee Manning).     docusate sodium (COLACE) 100 MG capsule Take 200 mg by mouth daily.     ibuprofen (ADVIL) 200 MG tablet Take 600 mg by mouth every 8  (eight) hours as needed for moderate Manning.     methocarbamol (ROBAXIN) 500 MG tablet Take 2 tablets by mouth every 8 hours as needed for 30 days. 180 tablet 0   No current facility-administered medications for this visit.    Allergies-reviewed and updated No Known Allergies  Social History   Socioeconomic History   Marital status: Married    Spouse name: Not on file   Number of children: 2   Years of education: Not on file   Highest education level: Not on file  Occupational History   Not on file  Tobacco Use   Smoking status: Former    Packs/day: 0.50    Years: 40.00    Total pack years: 20.00    Types: Cigarettes    Quit date: 2010    Years since quitting: 14.0   Smokeless tobacco: Never  Vaping Use   Vaping Use: Never used  Substance and Sexual Activity   Alcohol use: Yes    Alcohol/week: 5.0 - 10.0 standard drinks of alcohol    Types: 5 - 10 Cans of beer per week    Comment: Beer   Drug use: Never   Sexual activity: Yes  Other Topics Concern   Not on file  Social History Narrative   [employer]   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[employer]   Social Determinants of Health   Financial Resource Strain: Not on file  Food Insecurity: Not on file  Transportation Needs: Not on file  Physical Activity: Not on file  Stress: Not on file  Social Connections: Not on file        Objective:  Physical Exam: BP 126/79   Pulse 73   Temp 97.8 F (36.6 C) (Temporal)   Ht '5\' 10"'$  (1.778 m)   Wt 211 lb 12.8 oz (96.1 kg)   SpO2 98%   BMI 30.39 kg/m   Body mass index is 30.39 kg/m. Wt Readings from Last 3 Encounters:  01/15/22 211 lb 12.8 oz (96.1 kg)  07/13/21 209 lb (94.8 kg)  07/10/21 209 lb 8 oz (95 kg)   Gen: NAD, resting comfortably HEENT: TMs normal bilaterally. OP clear. No thyromegaly noted.  CV: RRR with no murmurs appreciated Pulm: NWOB, CTAB with no crackles, wheezes, or rhonchi GI: Normal bowel sounds present. Soft, Nontender, Nondistended. MSK: no edema,  cyanosis, or clubbing noted Skin: warm, dry Neuro: CN2-12 grossly intact. Strength 5/5 in upper and lower extremities. Reflexes symmetric and intact bilaterally.  Psych: Normal affect and thought content     Chad Jenison M. Jerline Pain, MD 01/15/2022 9:22 AM

## 2022-01-20 ENCOUNTER — Encounter (HOSPITAL_BASED_OUTPATIENT_CLINIC_OR_DEPARTMENT_OTHER): Payer: Self-pay | Admitting: Physical Therapy

## 2022-01-20 ENCOUNTER — Ambulatory Visit (HOSPITAL_BASED_OUTPATIENT_CLINIC_OR_DEPARTMENT_OTHER): Payer: Medicare PPO | Attending: Orthopaedic Surgery | Admitting: Physical Therapy

## 2022-01-20 DIAGNOSIS — M25611 Stiffness of right shoulder, not elsewhere classified: Secondary | ICD-10-CM | POA: Insufficient documentation

## 2022-01-20 DIAGNOSIS — M6281 Muscle weakness (generalized): Secondary | ICD-10-CM | POA: Diagnosis not present

## 2022-01-20 DIAGNOSIS — M25511 Pain in right shoulder: Secondary | ICD-10-CM | POA: Diagnosis not present

## 2022-01-20 NOTE — Therapy (Signed)
OUTPATIENT PHYSICAL THERAPY SHOULDER TREATMENT NOTE      Patient Name: Chad Manning MRN: [id number] DOB:03-30-1955, 67 y.o., male Today's Date: 01/20/2022     PT End of Session - 01/20/22 0814     Visit Number 40    Number of Visits 43    Date for PT Re-Evaluation 02/04/22    Authorization Type [employer]    PT Start Time 0807    PT Stop Time 0847    PT Time Calculation (min) 40 min    Activity Tolerance Patient tolerated treatment well    Behavior During Therapy WFL for tasks assessed/performed                    Past Medical History:  Diagnosis Date   Arthritis    Asthma    x 1 age 44   Hyperlipidemia    Right rotator cuff tear    Past Surgical History:  Procedure Laterality Date   COLONOSCOPY     JOINT REPLACEMENT     SHOULDER ARTHROSCOPY WITH ROTATOR CUFF REPAIR AND OPEN BICEPS TENODESIS Right 07/13/2021   Procedure: RIGHT SHOULDER ARTHROSCOPY WITH ROTATOR CUFF REPAIR AND BICEPS TENODESIS;  Surgeon: Vanetta Mulders, MD;  Location: [place] [facility];  Service: Orthopedics;  Laterality: Right;   TOTAL HIP ARTHROPLASTY     bilatera   Patient Active Problem List   Diagnosis Date Noted   Traumatic complete tear of right rotator cuff    Former smoker 12/14/2019   Acquired hallux rigidus of right foot 11/29/2018   Dyslipidemia 03/15/2018   Hyperglycemia 03/15/2018     REFERRING PROVIDER: Vanetta Mulders, MD   REFERRING DIAG: S46.011A (ICD-10-CM) - Traumatic complete tear of right rotator cuff, initial encounter   THERAPY DIAG:   Muscle weakness (generalized)  Stiffness of right shoulder, not elsewhere classified  Right shoulder pain, unspecified chronicity  Rationale for Evaluation and Treatment Rehabilitation  ONSET DATE: Injury 06/05/2021 ; DOS 07/13/2021  SUBJECTIVE:                                                                                                                                                                             SUBJECTIVE  STATEMENT: Pt is 6 months s/p supraspinatus, infraspinatus, and subscapularis repairs, biceps tenodesis, and subacromial decompression.   Pt states he had no issues after prior Rx.  He denies pain currently in L shoulder and states his R shoulder is bothering him more today.  Pt reports no difficulty with self care activities.  Pt has no significant limitations or difficulty with reaching activities.  Pt has limitations with lifting though states he is able to perform his functional lifting.       PERTINENT HISTORY: R shoulder  arthroscopic rotator cuff repair of supraspinatus, infraspinatus, and subscapularis, biceps tenodesis, and subacromial decompression on 07/13/2021.  MD message indicated subscapularis restrictions also.  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 No NPRS:  Current and best:  0/10 ; Worst:  0/10 Location:  R shoulder  PRECAUTIONS: Other: RCR of supraspinatus, infraspinatus, and subscapularis.  Hx of bilat THA  WEIGHT BEARING RESTRICTIONS   FALLS:  Has patient fallen in last 6 months? Yes. Number of falls 1, this injury  LIVING ENVIRONMENT: Lives with: lives with their spouse Lives in: 1 story home Stairs: 2 steps without rail to enter home   OCCUPATION: Pt is retired  PLOF: Independent; Pt was able to perform all of his ADLs/IADLs and reaching and overhead activities independently without limitation.  Pt was able to perform yard work and work on car without limitation.  Pt was able to pick up granddaughter.   PATIENT GOALS return to PLOF, have full ROM and strength, to be able to perform car work and yard work, pick up granddaughter     OBJECTIVE:   DIAGNOSTIC FINDINGS:  Pt had x rays and a MRI prior to surgery.    TODAY'S TREATMENT:   Therapeutic Exercise: -Reviewed current function, HEP compliance, pain level, and response to prior Rx.  -Pt performed:     UBE at L2 x 5 mins (3' fwd/2' bwd)    Standing jobe's  flexion with 2# x13 and 3# 2x10     Standing scaption with 1# 3x10    Shoulder IR belly press with YTB 2x10 and RTB x10    Barrel Hugs with RTB 2x10    Standing Cable Rows 10# 2x10-12    Standing Cable shoulder extension 10# 2x10     Neuro Re-ed Activities:    Standing D2 flexion 0# 3x10    Rhythmic stab's with p-ball on wall at 90 deg flexion 3x30 sec    Shelf reach 2# 3x10           PATIENT EDUCATION: Education details:  Educated pt concerning objective findings and progress in comparison to prior findings.  Educated pt concerning POC and PT answered Pt's questions.  Exercise form.  Person educated: Patient Education method: Explanation, Demonstration, Tactile cues, Verbal cues Education comprehension: verbalized understanding, returned demonstration, verbal cues required, tactile cues required, and needs further education   HOME EXERCISE PROGRAM: Access Code: [email or web address] URL: [email or web address] Date: 07/17/2021 Prepared by: Ronny Flurry     ASSESSMENT:  CLINICAL IMPRESSION: Pt has made great progress functionally as evidenced by subjective reports.  PT sent message to MD after prior Rx concerning his progress and deficits.  Pt demonstrates much improved tolerance to and improved strength with exercises today having improved form with standing elevation exercises and able to perform with resistance.  PT tried to increase activation of subscapularis with performing belly press and barrel hugs with T band.  Pt required much cuing and assistance for correct form with belly press but was able to perform. Pt responded well to Rx having no c/o's after Rx.  Pt should benefit from continued skilled PT services per protocol to improve strength and to establish long term HEP to maximize strength and stability.     OBJECTIVE IMPAIRMENTS decreased activity tolerance, decreased endurance, decreased ROM, decreased strength, hypomobility, impaired flexibility, and pain.    ACTIVITY LIMITATIONS carrying, lifting, bathing, dressing, reach over head, hygiene/grooming, and caring for others  PARTICIPATION LIMITATIONS: meal prep, cleaning,  community activity, and yard work   [employer] POTENTIAL: Good  CLINICAL DECISION MAKING: Stable/uncomplicated  EVALUATION COMPLEXITY: Low   GOALS:  SHORT TERM GOALS:   Pt will be independent and compliant with HEP for improved ROM, pain, and function.  Baseline: Goal status: GOAL MET Target date: 08/27/2021   2.  Pt will tolerate R shoulder PROM per protocol without significant pain for improved mobility and stiffness Baseline:  Goal status: GOAL MET  Target date: 08/06/2021   3.  Pt will demo R shoulder PROM to be 140 deg in flexion and 30 deg in ER for improved mobility and stiffness.  Baseline:  Goal status: GOAL MET Target date: 08/27/2021   4.  Pt will wean out of sling per MD allowance without adverse effects.  Baseline:  Goal status: GOAL MET Target date: 08/27/2021    5.  Pt will tolerate the initiation of shoulder AAROM without adverse effects.  Baseline:  Goal status: GOAL MET Target date: 08/27/2021   6.  Pt will demo supine shoulder AAROM to be 120 deg in flexion and in 40 deg in ER for progression of AAROM and protocol Baseline:  Goal status:  GOAL MET Target date:  09/10/2021  7.  Pt will demo R shoulder flexion and scaption AROM to at least 100 deg in standing without significant shoulder hike for improved UE elevation. Baseline:  Goal status: GOAL MET Target date:  10/01/2021  8.  Pt will be able to perform his self care activities with no > than minimal difficulty.   Baseline:  Goal status: GOAL MET Target date:  10/08/2021   LONG TERM GOALS: Target date: 01/07/2022     Pt will demo R shoulder AROM to be [facility] t/o for performance of ADLs and IADLs.  Baseline:  Goal status: GOAL MET  2.  Pt will be able to perform his ADLs and IADLs without significant difficulty and pain. Baseline:   Goal status: GOAL MET  3.   Pt will be able to perform his normal reaching and overhead activities without significant pain and limitation. Baseline:  Goal status: GOAL MET  4.  Pt will demo at least 4/5 MMT strength t/o R shoulder for improved tolerance with and performance of daily activities, functional lifting, and performance of yard work.  Baseline:  Goal status: ONGOING Target date: 02/04/22  5.  Pt will be able to perform appropriate functional carrying and lifting without significant pain in order to perform IADLs and household chores. Baseline:  Goal status: GOAL MET  6.  Pt will be independent with advanced HEP for improved shoulder strength and stability to assist with returning to desired level of function and performing car work and yard work. Baseline:  Goal status: PROGRESSING Target date: 02/04/22    PLAN: PT FREQUENCY:  1 time per week  PT DURATION: other: 4 weeks  PLANNED INTERVENTIONS: Therapeutic exercises, Therapeutic activity, Neuromuscular re-education, Patient/Family education, Joint mobilization, Aquatic Therapy, Dry Needling, Electrical stimulation, Spinal mobilization, Cryotherapy, Moist heat, Taping, Ultrasound, Manual therapy, and Re-evaluation  PLAN FOR NEXT SESSION:   Cont per Dr. Eddie Dibbles Rotator Cuff Repair protocol.  Will use subscapularis repair protocol also.  Pt had supraspinatus, infraspinatus, and subscapularis repairs and biceps tenodesis.   Work on long term HEP.    Selinda Michaels III PT, DPT 01/20/22 10:32 PM

## 2022-01-22 ENCOUNTER — Encounter (HOSPITAL_BASED_OUTPATIENT_CLINIC_OR_DEPARTMENT_OTHER): Payer: Medicare PPO | Admitting: Physical Therapy

## 2022-01-26 ENCOUNTER — Ambulatory Visit (HOSPITAL_BASED_OUTPATIENT_CLINIC_OR_DEPARTMENT_OTHER): Payer: Medicare PPO | Admitting: Physical Therapy

## 2022-01-26 DIAGNOSIS — M25611 Stiffness of right shoulder, not elsewhere classified: Secondary | ICD-10-CM | POA: Diagnosis not present

## 2022-01-26 DIAGNOSIS — M25511 Pain in right shoulder: Secondary | ICD-10-CM | POA: Diagnosis not present

## 2022-01-26 DIAGNOSIS — M6281 Muscle weakness (generalized): Secondary | ICD-10-CM

## 2022-01-26 NOTE — Therapy (Signed)
OUTPATIENT PHYSICAL THERAPY SHOULDER TREATMENT NOTE      Patient Name: Chad Manning MRN: [id number] DOB:02-17-1955, 67 y.o., male Today's Date: 01/27/2022     PT End of Session - 01/26/22 1024     Visit Number 41    Number of Visits 43    Date for PT Re-Evaluation 02/04/22    Authorization Type [employer]    PT Start Time 0933    PT Stop Time 1016    PT Time Calculation (min) 43 min    Activity Tolerance Patient tolerated treatment well    Behavior During Therapy WFL for tasks assessed/performed                     Past Medical History:  Diagnosis Date   Arthritis    Asthma    x 1 age 64   Hyperlipidemia    Right rotator cuff tear    Past Surgical History:  Procedure Laterality Date   COLONOSCOPY     JOINT REPLACEMENT     SHOULDER ARTHROSCOPY WITH ROTATOR CUFF REPAIR AND OPEN BICEPS TENODESIS Right 07/13/2021   Procedure: RIGHT SHOULDER ARTHROSCOPY WITH ROTATOR CUFF REPAIR AND BICEPS TENODESIS;  Surgeon: Vanetta Mulders, MD;  Location: [place];  Service: Orthopedics;  Laterality: Right;   TOTAL HIP ARTHROPLASTY     bilatera   Patient Active Problem List   Diagnosis Date Noted   Traumatic complete tear of right rotator cuff    Former smoker 12/14/2019   Acquired hallux rigidus of right foot 11/29/2018   Dyslipidemia 03/15/2018   Hyperglycemia 03/15/2018     REFERRING PROVIDER: Vanetta Mulders, MD   REFERRING DIAG: S46.011A (ICD-10-CM) - Traumatic complete tear of right rotator cuff, initial encounter   THERAPY DIAG:   Muscle weakness (generalized)  Stiffness of right shoulder, not elsewhere classified  Right shoulder pain, unspecified chronicity  Rationale for Evaluation and Treatment Rehabilitation  ONSET DATE: Injury 06/05/2021 ; DOS 07/13/2021  SUBJECTIVE:                                                                                                                                                                             SUBJECTIVE  STATEMENT: Pt is 6 months s/p supraspinatus, infraspinatus, and subscapularis repairs, biceps tenodesis, and subacromial decompression.   Pt denies any adverse effects after prior Rx.  Pt reports no significant changes in his R shoulder.  Pt noticed difficulty with putting a flag on the holder overhead at his garage and had to use his L UE primarily.  He denies pain currently in R shoulder.  Pt reports having some soreness in L shoulder.  Pt reports no difficulty with self care activities.  Pt has no significant  limitations or difficulty with reaching activities.  Pt able to perform his daily functional lifting.       PERTINENT HISTORY: R shoulder arthroscopic rotator cuff repair of supraspinatus, infraspinatus, and subscapularis, biceps tenodesis, and subacromial decompression on 07/13/2021.  MD message indicated subscapularis restrictions also.  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 No NPRS:  Current and best:  0/10 ; Worst:  0/10 Location:  R shoulder  PRECAUTIONS: Other: RCR of supraspinatus, infraspinatus, and subscapularis.  Hx of bilat THA  WEIGHT BEARING RESTRICTIONS   FALLS:  Has patient fallen in last 6 months? Yes. Number of falls 1, this injury  LIVING ENVIRONMENT: Lives with: lives with their spouse Lives in: 1 story home Stairs: 2 steps without rail to enter home   OCCUPATION: Pt is retired  PLOF: Independent; Pt was able to perform all of his ADLs/IADLs and reaching and overhead activities independently without limitation.  Pt was able to perform yard work and work on car without limitation.  Pt was able to pick up granddaughter.   PATIENT GOALS return to PLOF, have full ROM and strength, to be able to perform car work and yard work, pick up granddaughter     OBJECTIVE:   DIAGNOSTIC FINDINGS:  Pt had x rays and a MRI prior to surgery.    TODAY'S TREATMENT:   Therapeutic Exercise: -Reviewed current function, HEP  compliance, pain level, and response to prior Rx.  -Pt performed:     UBE at L2 x 5 mins (4' fwd/1' bwd)    Standing jobe's flexion with 2# x13 and 3# 2x10     Standing scaption with 1# 2x10, 2#x10    Shoulder IR belly press with RTB 3x10    Barrel Hugs with RTB 2x10    Standing Cable Rows 10# x15, 15# x 10    Standing Cable shoulder extension 10# 2x10      Functional reaching waist to shoulder with 5# 2x10     Neuro Re-ed Activities:    Standing D2 flexion 0# x10, 1# 2x6-7    Shelf reach 2# 2x10    4D ball rolls 2x10    Standing shoulder ABC x1 reps    ABC with ball on wall A-S            PATIENT EDUCATION: Education details:  Educated pt concerning HEP, appropriate resistance and exercises, and POC.  Exercise form.  Person educated: Patient Education method: Explanation, Demonstration, Tactile cues, Verbal cues Education comprehension: verbalized understanding, returned demonstration, verbal cues required, tactile cues required, and needs further education   HOME EXERCISE PROGRAM: Access Code: [email or web address] URL: [email or web address] Date: 07/17/2021 Prepared by: Ronny Flurry     ASSESSMENT:  CLINICAL IMPRESSION: Pt is doing well overall functionally as evidenced by subjective reports.  He still has weakness in R shoulder.  He requires cuing for correct form with exercises including to slow down and control movement though has improved overall.  PT tried to increase activation of subscapularis with performing belly press and barrel hugs with T band.  Pt demonstrated improved form with belly press though still required cuing for correct form.  He tolerated resistance exercises well though had better form with standing D2 flexion without weight.  PT spent time educating pt concerning advanced HEP including appropriate resistance and exercises.  Pt responded well to Rx having no c/o's after Rx.  Pt should benefit from continued skilled PT services per protocol to  improve strength and to establish long term HEP to maximize strength and stability.     OBJECTIVE IMPAIRMENTS decreased activity tolerance, decreased endurance, decreased ROM, decreased strength, hypomobility, impaired flexibility, and pain.   ACTIVITY LIMITATIONS carrying, lifting, bathing, dressing, reach over head, hygiene/grooming, and caring for others  PARTICIPATION LIMITATIONS: meal prep, cleaning, community activity, and yard work   [employer] POTENTIAL: Good  CLINICAL DECISION MAKING: Stable/uncomplicated  EVALUATION COMPLEXITY: Low   GOALS:  SHORT TERM GOALS:   Pt will be independent and compliant with HEP for improved ROM, pain, and function.  Baseline: Goal status: GOAL MET Target date: 08/27/2021   2.  Pt will tolerate R shoulder PROM per protocol without significant pain for improved mobility and stiffness Baseline:  Goal status: GOAL MET  Target date: 08/06/2021   3.  Pt will demo R shoulder PROM to be 140 deg in flexion and 30 deg in ER for improved mobility and stiffness.  Baseline:  Goal status: GOAL MET Target date: 08/27/2021   4.  Pt will wean out of sling per MD allowance without adverse effects.  Baseline:  Goal status: GOAL MET Target date: 08/27/2021    5.  Pt will tolerate the initiation of shoulder AAROM without adverse effects.  Baseline:  Goal status: GOAL MET Target date: 08/27/2021   6.  Pt will demo supine shoulder AAROM to be 120 deg in flexion and in 40 deg in ER for progression of AAROM and protocol Baseline:  Goal status:  GOAL MET Target date:  09/10/2021  7.  Pt will demo R shoulder flexion and scaption AROM to at least 100 deg in standing without significant shoulder hike for improved UE elevation. Baseline:  Goal status: GOAL MET Target date:  10/01/2021  8.  Pt will be able to perform his self care activities with no > than minimal difficulty.   Baseline:  Goal status: GOAL MET Target date:  10/08/2021   LONG TERM GOALS:  Target date: 01/07/2022     Pt will demo R shoulder AROM to be [facility] t/o for performance of ADLs and IADLs.  Baseline:  Goal status: GOAL MET  2.  Pt will be able to perform his ADLs and IADLs without significant difficulty and pain. Baseline:  Goal status: GOAL MET  3.   Pt will be able to perform his normal reaching and overhead activities without significant pain and limitation. Baseline:  Goal status: GOAL MET  4.  Pt will demo at least 4/5 MMT strength t/o R shoulder for improved tolerance with and performance of daily activities, functional lifting, and performance of yard work.  Baseline:  Goal status: ONGOING Target date: 02/04/22  5.  Pt will be able to perform appropriate functional carrying and lifting without significant pain in order to perform IADLs and household chores. Baseline:  Goal status: GOAL MET  6.  Pt will be independent with advanced HEP for improved shoulder strength and stability to assist with returning to desired level of function and performing car work and yard work. Baseline:  Goal status: PROGRESSING Target date: 02/04/22    PLAN: PT FREQUENCY:  1 time per week  PT DURATION: other: 4 weeks  PLANNED INTERVENTIONS: Therapeutic exercises, Therapeutic activity, Neuromuscular re-education, Patient/Family education, Joint mobilization, Aquatic Therapy, Dry Needling, Electrical stimulation, Spinal mobilization, Cryotherapy, Moist heat, Taping, Ultrasound, Manual therapy, and Re-evaluation  PLAN FOR NEXT SESSION:   Cont per Dr. Eddie Dibbles Rotator Cuff Repair protocol.  Will use subscapularis repair protocol also.  Pt had  supraspinatus, infraspinatus, and subscapularis repairs and biceps tenodesis.   Work on long term HEP.  Possible discharge in 1-2 visits.    Selinda Michaels III PT, DPT 01/27/22 7:27 AM

## 2022-01-27 ENCOUNTER — Encounter (HOSPITAL_BASED_OUTPATIENT_CLINIC_OR_DEPARTMENT_OTHER): Payer: Self-pay | Admitting: Physical Therapy

## 2022-01-28 ENCOUNTER — Encounter (HOSPITAL_BASED_OUTPATIENT_CLINIC_OR_DEPARTMENT_OTHER): Payer: Medicare PPO | Admitting: Physical Therapy

## 2022-02-02 ENCOUNTER — Encounter (HOSPITAL_BASED_OUTPATIENT_CLINIC_OR_DEPARTMENT_OTHER): Payer: Self-pay | Admitting: Physical Therapy

## 2022-02-02 ENCOUNTER — Ambulatory Visit (HOSPITAL_BASED_OUTPATIENT_CLINIC_OR_DEPARTMENT_OTHER): Payer: Medicare PPO | Admitting: Physical Therapy

## 2022-02-02 DIAGNOSIS — M25511 Pain in right shoulder: Secondary | ICD-10-CM | POA: Diagnosis not present

## 2022-02-02 DIAGNOSIS — M6281 Muscle weakness (generalized): Secondary | ICD-10-CM | POA: Diagnosis not present

## 2022-02-02 DIAGNOSIS — M25611 Stiffness of right shoulder, not elsewhere classified: Secondary | ICD-10-CM | POA: Diagnosis not present

## 2022-02-02 NOTE — Therapy (Addendum)
OUTPATIENT PHYSICAL THERAPY SHOULDER TREATMENT NOTE      Patient Name: Chad Manning MRN: [id number] DOB:September 08, 1955, 67 y.o., male Today's Date: 02/03/2022     PT End of Session - 02/02/22 0910     Visit Number 42    Number of Visits 42    Date for PT Re-Evaluation 02/04/22    Authorization Type [employer]    PT Start Time 0805    PT Stop Time 0853    PT Time Calculation (min) 48 min    Activity Tolerance Patient tolerated treatment well    Behavior During Therapy WFL for tasks assessed/performed                      Past Medical History:  Diagnosis Date   Arthritis    Asthma    x 1 age 55   Hyperlipidemia    Right rotator cuff tear    Past Surgical History:  Procedure Laterality Date   COLONOSCOPY     JOINT REPLACEMENT     SHOULDER ARTHROSCOPY WITH ROTATOR CUFF REPAIR AND OPEN BICEPS TENODESIS Right 07/13/2021   Procedure: RIGHT SHOULDER ARTHROSCOPY WITH ROTATOR CUFF REPAIR AND BICEPS TENODESIS;  Surgeon: Vanetta Mulders, MD;  Location: [place];  Service: Orthopedics;  Laterality: Right;   TOTAL HIP ARTHROPLASTY     bilatera   Patient Active Problem List   Diagnosis Date Noted   Traumatic complete tear of right rotator cuff    Former smoker 12/14/2019   Acquired hallux rigidus of right foot 11/29/2018   Dyslipidemia 03/15/2018   Hyperglycemia 03/15/2018     REFERRING PROVIDER: Vanetta Mulders, MD   REFERRING DIAG: S46.011A (ICD-10-CM) - Traumatic complete tear of right rotator cuff, initial encounter   THERAPY DIAG:   Muscle weakness (generalized)  Stiffness of right shoulder, not elsewhere classified  Right shoulder pain, unspecified chronicity  Rationale for Evaluation and Treatment Rehabilitation  ONSET DATE: Injury 06/05/2021 ; DOS 07/13/2021  SUBJECTIVE:                                                                                                                                                                             SUBJECTIVE  STATEMENT: Pt is 6.5 months s/p supraspinatus, infraspinatus, and subscapularis repairs, biceps tenodesis, and subacromial decompression.  Pt states he thinks today should be his last day.  Pt denies any adverse effects after prior Rx.  Pt states he is doing well.  He denies pain currently in R shoulder.  Pt reports no difficulty with self care activities.  Pt has no significant limitations or difficulty with reaching activities.  Pt able to perform his daily functional lifting.        PERTINENT HISTORY:  R shoulder arthroscopic rotator cuff repair of supraspinatus, infraspinatus, and subscapularis, biceps tenodesis, and subacromial decompression on 07/13/2021.  MD message indicated subscapularis restrictions also.  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 No NPRS:  Current and best:  0/10 ; Worst:  0/10 Location:  R shoulder  PRECAUTIONS: Other: RCR of supraspinatus, infraspinatus, and subscapularis.  Hx of bilat THA  WEIGHT BEARING RESTRICTIONS   FALLS:  Has patient fallen in last 6 months? Yes. Number of falls 1, this injury  LIVING ENVIRONMENT: Lives with: lives with their spouse Lives in: 1 story home Stairs: 2 steps without rail to enter home   OCCUPATION: Pt is retired  PLOF: Independent; Pt was able to perform all of his ADLs/IADLs and reaching and overhead activities independently without limitation.  Pt was able to perform yard work and work on car without limitation.  Pt was able to pick up granddaughter.   PATIENT GOALS return to PLOF, have full ROM and strength, to be able to perform car work and yard work, pick up granddaughter     OBJECTIVE:   DIAGNOSTIC FINDINGS:  Pt had x rays and a MRI prior to surgery.    TODAY'S TREATMENT:    Therapeutic Exercise: -Reviewed current function, HEP compliance, pain level, and response to prior Rx.   -R shoulder AROM (standing):  Flex:  161                                                    Scaption:  155                                                   Abd:  143                                                   ER: 90                                                   IR:  58    -Functional IR AROM:  R:  T10, L:  3     -R shoulder strength:  Flexion:  MMT:  R:  tolerated min resistance,  L:  flexion:  4/5                                                   HHD:  R: Prior/Current: 7.4 / 14.9 with compensation, L:  18.1                                    Scaption:  R:  tolerated slight resistance, L:  5/5  ER:  4+/5                                    IR:  unable to tolerate resistance -UEFI:  78/80  -PT extensively went through HEP educating pt in correct exercises, correct form, and appropriate frequency.  PT updated HEP and gave pt a HEP handout.  Educated pt in appropriate resistance and frequency.  Instructed pt in appropriate frequency of cable gym exercises.    Pt performed:     UBE at L2 x 3 mins (1.5' fwd and bwd)    Standing jobe's flexion with 3# 2x10     Standing scaption with 2# 2x10    Shoulder IR with arm at side 2x10    Shoulder IR belly press with GTB 2x10    Standing Cable Rows 10# x15, 15# x 10    Standing Cable shoulder extension 10# 2x10          Neuro Re-ed Activities:    Standing D2 flexion 0# 2x10    Shelf reach 2# 2x10    ABC with ball on wall x 1 reps           PATIENT EDUCATION: Education details:  Educated pt concerning HEP including appropriate resistance, frequency, and exercises.  Discharge planning.  Exercise form.  PT answered questions.  Person educated: Patient Education method: Explanation, Demonstration, Tactile cues, Verbal cues, handout Education comprehension:  verbalized understanding, returned demonstration, verbal cues required, tactile cues required   HOME EXERCISE PROGRAM: Access Code: [email or web address] URL: [email or web address] Date: 07/17/2021 Prepared by: Ronny Flurry  Updated HEP: - Standing Shoulder Flexion with Dumbbells  - 1 x daily - 3 x weekly - 3 sets - 10 reps - Scaption with Dumbbells  - 1 x daily - 3 x weekly - 3 sets - 10 reps - Standing Wall [employer] with Mini Swiss Ball  - 1 x daily - 4 x weekly - 2 sets - 10 reps - Shoulder PNF D2 Flexion  - 1 x daily - 3 x weekly - 3 sets - 5-10 reps - Shoulder Alphabet with Ball at Wall  - 1 x daily - 4 x weekly - 1 reps    ASSESSMENT:  CLINICAL IMPRESSION: Pt has made good progress in PT.  Pt is doing well overall functionally and denies any functional limitations.  He reports he is able to perform his ADLs and IADLs well without significant difficulty and also ale to perform his daily functional lifting.  Pt has good AROM t/o R shoulder though continues to have significant weakness in R shoulder.  Pt performs exercises per protocol well and has progressed with resistance though continues to have strength deficits.  PT thoroughly educated pt with current HEP and advanced HEP.  PT went through exercises he should perform and updated HEP.  PT educated pt in appropriate frequency, exercises, and resistance.  Pt demonstrates good understanding and is independent with program.  He is able to reach overhead well.  Pt demonstrates improved self perceived disability with UEFI improving from prior 74/80 to currently 78/80.  Pt has met all goals except strength goal (LTG #4).  Pt states he is ready for discharge.  PT will plan to discharge, but will wait until he sees MD later this week.     OBJECTIVE IMPAIRMENTS decreased activity tolerance, decreased endurance, decreased ROM, decreased strength, hypomobility, impaired flexibility, and pain.   ACTIVITY  LIMITATIONS carrying, lifting, bathing, dressing, reach over head, hygiene/grooming, and caring for others  PARTICIPATION LIMITATIONS: meal prep, cleaning, community activity, and yard work   [employer] POTENTIAL: Good  CLINICAL DECISION MAKING:  Stable/uncomplicated  EVALUATION COMPLEXITY: Low   GOALS:  SHORT TERM GOALS:   Pt will be independent and compliant with HEP for improved ROM, pain, and function.  Baseline: Goal status: GOAL MET Target date: 08/27/2021   2.  Pt will tolerate R shoulder PROM per protocol without significant pain for improved mobility and stiffness Baseline:  Goal status: GOAL MET  Target date: 08/06/2021   3.  Pt will demo R shoulder PROM to be 140 deg in flexion and 30 deg in ER for improved mobility and stiffness.  Baseline:  Goal status: GOAL MET Target date: 08/27/2021   4.  Pt will wean out of sling per MD allowance without adverse effects.  Baseline:  Goal status: GOAL MET Target date: 08/27/2021    5.  Pt will tolerate the initiation of shoulder AAROM without adverse effects.  Baseline:  Goal status: GOAL MET Target date: 08/27/2021   6.  Pt will demo supine shoulder AAROM to be 120 deg in flexion and in 40 deg in ER for progression of AAROM and protocol Baseline:  Goal status:  GOAL MET Target date:  09/10/2021  7.  Pt will demo R shoulder flexion and scaption AROM to at least 100 deg in standing without significant shoulder hike for improved UE elevation. Baseline:  Goal status: GOAL MET Target date:  10/01/2021  8.  Pt will be able to perform his self care activities with no > than minimal difficulty.   Baseline:  Goal status: GOAL MET Target date:  10/08/2021   LONG TERM GOALS: Target date: 01/07/2022     Pt will demo R shoulder AROM to be [facility] t/o for performance of ADLs and IADLs.  Baseline:  Goal status: GOAL MET  2.  Pt will be able to perform his ADLs and IADLs without significant difficulty and pain. Baseline:  Goal status: GOAL MET  3.   Pt will be able to perform his normal reaching and overhead activities without significant pain and limitation. Baseline:  Goal status: GOAL MET  4.  Pt will demo at least 4/5 MMT strength t/o R shoulder for improved  tolerance with and performance of daily activities, functional lifting, and performance of yard work.  Baseline:  Goal status: NOT MET Target date: 02/04/22  5.  Pt will be able to perform appropriate functional carrying and lifting without significant pain in order to perform IADLs and household chores. Baseline:  Goal status: GOAL MET  6.  Pt will be independent with advanced HEP for improved shoulder strength and stability to assist with returning to desired level of function and performing car work and yard work. Baseline:  Goal status: GOAL MET Target date: 02/04/22    PLAN: PT FREQUENCY:  1 time per week  PT DURATION: other: 4 weeks  PLANNED INTERVENTIONS: Therapeutic exercises, Therapeutic activity, Neuromuscular re-education, Patient/Family education, Joint mobilization, Aquatic Therapy, Dry Needling, Electrical stimulation, Spinal mobilization, Cryotherapy, Moist heat, Taping, Ultrasound, Manual therapy, and Re-evaluation  PLAN FOR NEXT SESSION:   Plan to discharge pt.  Pt sees MD later this week and will wait until Pt sees MD.  Pt has advanced HEP.   Selinda Michaels III PT, DPT 02/05/22 6:14 PM  PHYSICAL THERAPY DISCHARGE SUMMARY  Visits from Start of Care: 42  Current functional level related to  goals / functional outcomes: See above   Remaining deficits: See above   Education / Equipment: Pt has a HEP.     Patient stopped by PT after seeing MD.  He states he is fine to be discharged.  Pt will be discharged due to meeting all goals except strength goal (LTG #4).  He will cont with HEP.        Selinda Michaels III PT, DPT 02/03/22 8:14 AM

## 2022-02-04 ENCOUNTER — Ambulatory Visit (INDEPENDENT_AMBULATORY_CARE_PROVIDER_SITE_OTHER): Payer: Medicare PPO | Admitting: Orthopaedic Surgery

## 2022-02-04 ENCOUNTER — Other Ambulatory Visit (INDEPENDENT_AMBULATORY_CARE_PROVIDER_SITE_OTHER): Payer: Medicare PPO

## 2022-02-04 ENCOUNTER — Other Ambulatory Visit (HOSPITAL_COMMUNITY): Payer: Self-pay

## 2022-02-04 DIAGNOSIS — S46011A Strain of muscle(s) and tendon(s) of the rotator cuff of right shoulder, initial encounter: Secondary | ICD-10-CM | POA: Diagnosis not present

## 2022-02-04 DIAGNOSIS — E785 Hyperlipidemia, unspecified: Secondary | ICD-10-CM | POA: Diagnosis not present

## 2022-02-04 DIAGNOSIS — Z0001 Encounter for general adult medical examination with abnormal findings: Secondary | ICD-10-CM

## 2022-02-04 DIAGNOSIS — Z125 Encounter for screening for malignant neoplasm of prostate: Secondary | ICD-10-CM | POA: Diagnosis not present

## 2022-02-04 DIAGNOSIS — R739 Hyperglycemia, unspecified: Secondary | ICD-10-CM | POA: Diagnosis not present

## 2022-02-04 LAB — COMPREHENSIVE METABOLIC PANEL
ALT: 13 U/L (ref 0–53)
AST: 13 U/L (ref 0–37)
Albumin: 4.1 g/dL (ref 3.5–5.2)
Alkaline Phosphatase: 73 U/L (ref 39–117)
BUN: 17 mg/dL (ref 6–23)
CO2: 28 mEq/L (ref 19–32)
Calcium: 9.1 mg/dL (ref 8.4–10.5)
Chloride: 106 mEq/L (ref 96–112)
Creatinine, Ser: 1.03 mg/dL (ref 0.40–1.50)
GFR: 75.63 mL/min (ref >60.00)
Glucose, Bld: 115 mg/dL — ABNORMAL HIGH (ref 70–99)
Potassium: 4.8 mEq/L (ref 3.5–5.1)
Sodium: 140 mEq/L (ref 135–145)
Total Bilirubin: 0.8 mg/dL (ref 0.2–1.2)
Total Protein: 6.6 g/dL (ref 6.0–8.3)

## 2022-02-04 LAB — HEMOGLOBIN A1C: Hgb A1c MFr Bld: 6.1 % (ref 4.6–6.5)

## 2022-02-04 LAB — CBC
HCT: 40.5 % (ref 39.0–52.0)
Hemoglobin: 13.6 g/dL (ref 13.0–17.0)
MCHC: 33.6 g/dL (ref 30.0–36.0)
MCV: 90.1 fl (ref 78.0–100.0)
Platelets: 212 10*3/uL (ref 150.0–400.0)
RBC: 4.5 Mil/uL (ref 4.22–5.81)
RDW: 13.6 % (ref 11.5–15.5)
WBC: 7.8 10*3/uL (ref 4.0–10.5)

## 2022-02-04 LAB — LIPID PANEL
Cholesterol: 162 mg/dL (ref 0–200)
HDL: 63.6 mg/dL (ref >39.00)
LDL Cholesterol: 86 mg/dL (ref 0–99)
NonHDL: 98.49
Total CHOL/HDL Ratio: 3
Triglycerides: 62 mg/dL (ref 0.0–149.0)
VLDL: 12.4 mg/dL (ref 0.0–40.0)

## 2022-02-04 LAB — PSA: PSA: 0.71 ng/mL (ref 0.10–4.00)

## 2022-02-04 LAB — TSH: TSH: 2.15 u[IU]/mL (ref 0.35–5.50)

## 2022-02-04 MED ORDER — METHOCARBAMOL 500 MG PO TABS
500.0000 mg | ORAL_TABLET | Freq: Four times a day (QID) | ORAL | 3 refills | Status: DC
  Filled 2022-02-04: qty 30, 8d supply, fill #0
  Filled 2022-04-29: qty 30, 8d supply, fill #1
  Filled 2022-07-19: qty 30, 8d supply, fill #2

## 2022-02-04 NOTE — Progress Notes (Signed)
Post Operative Evaluation    Procedure/Date of Surgery: Shoulder rotator cuff repair and subscapularis repair 07/13/2021  Interval History:  Presents today for follow-up of the above procedure.  Overall he is doing extremely well.  Range of motion is improving strength is improving.  He has no pain.  There is occasional clicking about the [facility] joint.   PMH/PSH/Family History/Social History/Meds/Allergies:    Past Medical History:  Diagnosis Date   Arthritis    Asthma    x 1 age 80   Hyperlipidemia    Right rotator cuff tear    Past Surgical History:  Procedure Laterality Date   COLONOSCOPY     JOINT REPLACEMENT     SHOULDER ARTHROSCOPY WITH ROTATOR CUFF REPAIR AND OPEN BICEPS TENODESIS Right 07/13/2021   Procedure: RIGHT SHOULDER ARTHROSCOPY WITH ROTATOR CUFF REPAIR AND BICEPS TENODESIS;  Surgeon: Vanetta Mulders, MD;  Location: [place];  Service: Orthopedics;  Laterality: Right;   TOTAL HIP ARTHROPLASTY     bilatera   Social History   Socioeconomic History   Marital status: Married    Spouse name: Not on file   Number of children: 2   Years of education: Not on file   Highest education level: Not on file  Occupational History   Not on file  Tobacco Use   Smoking status: Former    Packs/day: 0.50    Years: 40.00    Total pack years: 20.00    Types: Cigarettes    Quit date: 2010    Years since quitting: 14.0   Smokeless tobacco: Never  Vaping Use   Vaping Use: Never used  Substance and Sexual Activity   Alcohol use: Yes    Alcohol/week: 5.0 - 10.0 standard drinks of alcohol    Types: 5 - 10 Cans of beer per week    Comment: Beer   Drug use: Never   Sexual activity: Yes  Other Topics Concern   Not on file  Social History Narrative   [employer]   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[employer]   Social Determinants of Health   Financial Resource Strain: Not on file  Food Insecurity: Not on file  Transportation Needs: Not on file  Physical  Activity: Not on file  Stress: Not on file  Social Connections: Not on file   Family History  Problem Relation Age of Onset   Heart disease Father    Cancer Mother    Lung cancer Sister        smoker   Colon cancer Neg Hx    Prostate cancer Neg Hx    Esophageal cancer Neg Hx    Rectal cancer Neg Hx    No Known Allergies Current Outpatient Medications  Medication Sig Dispense Refill   methocarbamol (ROBAXIN) 500 MG tablet Take 1 tablet (500 mg total) by mouth 4 (four) times daily. 30 tablet 3   acetaminophen (TYLENOL) 500 MG tablet Take 500 mg by mouth every 6 (six) hours as needed.     AMBULATORY NON FORMULARY MEDICATION Lift chair.  Dispense 1.  R26.81. 1 each 0   amoxicillin (AMOXIL) 500 MG capsule Take 4 capsules 1 hour prior to dental appointment. 16 capsule 1   aspirin EC 325 MG tablet Take 1 tablet (325 mg total) by mouth daily. 30 tablet 0   atorvastatin (LIPITOR) 40 MG  tablet TAKE 1 TABLET BY MOUTH DAILY 90 tablet 1   diclofenac Sodium (VOLTAREN) 1 % GEL Apply 1 Application topically 4 (four) times daily as needed (knee pain).     docusate sodium (COLACE) 100 MG capsule Take 200 mg by mouth daily.     ibuprofen (ADVIL) 200 MG tablet Take 600 mg by mouth every 8 (eight) hours as needed for moderate pain.     methocarbamol (ROBAXIN) 500 MG tablet Take 2 tablets by mouth every 8 hours as needed for 30 days. 180 tablet 0   No current facility-administered medications for this visit.   No results found.  Review of Systems:   A ROS was performed including pertinent positives and negatives as documented in the HPI.   Musculoskeletal Exam:    Right shoulder portals are well-healed.  Active forward elevation in standing position is to 160 degrees bilaterally.  External rotation at the side is to 70 degrees equal to the contralateral side.  I internal rotation is to L1 compared to T12 on the contralateral side.    Imaging:    None  I personally reviewed and interpreted the  radiographs.   Assessment:   67 year old male who is status post right shoulder rotator cuff repair with subscapularis repair continue to improve.  At this time I do believe that he has done quite well.  He does not have any pain.  Overhead motion is dramatically improved.  His strength is near equal to the contralateral side.  He will continue to work on this and I will plan to see him back as needed Plan :    -Return to clinic as needed     I personally saw and evaluated the patient, and participated in the management and treatment plan.  Vanetta Mulders, MD Attending Physician, Orthopedic Surgery  This document was dictated using Dragon voice recognition software. A reasonable attempt at proof reading has been made to minimize errors.

## 2022-02-05 ENCOUNTER — Encounter (HOSPITAL_BASED_OUTPATIENT_CLINIC_OR_DEPARTMENT_OTHER): Payer: Self-pay

## 2022-02-05 ENCOUNTER — Ambulatory Visit (HOSPITAL_BASED_OUTPATIENT_CLINIC_OR_DEPARTMENT_OTHER): Payer: Medicare PPO | Admitting: Physical Therapy

## 2022-02-05 NOTE — Progress Notes (Signed)
Please inform patient of the following:  A1c in prediabetic range but stable compared to previous.  Always on the labs are at goal.  Do not need to make any changes to treatment plan at this time.  He should continue to work on diet and exercise and we can recheck in year.

## 2022-02-08 ENCOUNTER — Other Ambulatory Visit (HOSPITAL_COMMUNITY): Payer: Self-pay

## 2022-02-09 ENCOUNTER — Ambulatory Visit (HOSPITAL_BASED_OUTPATIENT_CLINIC_OR_DEPARTMENT_OTHER): Payer: Medicare PPO | Admitting: Physical Therapy

## 2022-02-10 ENCOUNTER — Telehealth: Payer: Self-pay | Admitting: Family Medicine

## 2022-02-10 NOTE — Telephone Encounter (Signed)
Patient returned call

## 2022-02-11 ENCOUNTER — Ambulatory Visit (HOSPITAL_BASED_OUTPATIENT_CLINIC_OR_DEPARTMENT_OTHER): Payer: Medicare PPO | Admitting: Physical Therapy

## 2022-02-11 NOTE — Telephone Encounter (Signed)
See results note. 

## 2022-02-15 ENCOUNTER — Other Ambulatory Visit: Payer: Self-pay | Admitting: Acute Care

## 2022-02-15 DIAGNOSIS — Z87891 Personal history of nicotine dependence: Secondary | ICD-10-CM

## 2022-02-17 ENCOUNTER — Other Ambulatory Visit: Payer: Medicare PPO

## 2022-02-26 ENCOUNTER — Other Ambulatory Visit (HOSPITAL_COMMUNITY): Payer: Self-pay

## 2022-02-26 ENCOUNTER — Encounter: Payer: Self-pay | Admitting: Family Medicine

## 2022-02-26 ENCOUNTER — Ambulatory Visit (INDEPENDENT_AMBULATORY_CARE_PROVIDER_SITE_OTHER): Payer: Medicare PPO | Admitting: Family Medicine

## 2022-02-26 VITALS — BP 124/76 | HR 93 | Temp 97.7°F | Ht 70.0 in | Wt 208.0 lb

## 2022-02-26 DIAGNOSIS — E785 Hyperlipidemia, unspecified: Secondary | ICD-10-CM | POA: Diagnosis not present

## 2022-02-26 DIAGNOSIS — J329 Chronic sinusitis, unspecified: Secondary | ICD-10-CM | POA: Diagnosis not present

## 2022-02-26 DIAGNOSIS — R6883 Chills (without fever): Secondary | ICD-10-CM

## 2022-02-26 DIAGNOSIS — Z87891 Personal history of nicotine dependence: Secondary | ICD-10-CM | POA: Diagnosis not present

## 2022-02-26 DIAGNOSIS — R739 Hyperglycemia, unspecified: Secondary | ICD-10-CM

## 2022-02-26 LAB — POCT INFLUENZA A/B
Influenza A, POC: NEGATIVE
Influenza B, POC: NEGATIVE

## 2022-02-26 LAB — POC COVID19 BINAXNOW: SARS Coronavirus 2 Ag: NEGATIVE

## 2022-02-26 MED ORDER — AMOXICILLIN-POT CLAVULANATE 875-125 MG PO TABS
1.0000 | ORAL_TABLET | Freq: Two times a day (BID) | ORAL | 0 refills | Status: DC
  Filled 2022-02-26: qty 20, 10d supply, fill #0

## 2022-02-26 NOTE — Assessment & Plan Note (Signed)
Lipids at goal on last lipid panel.  Continue Lipitor 40 mg daily.

## 2022-02-26 NOTE — Assessment & Plan Note (Signed)
Last A1c stable 6.1.  He is working on lifestyle modifications.

## 2022-02-26 NOTE — Progress Notes (Signed)
   Chad Manning is a 67 y.o. male who presents today for an office visit.  Assessment/Plan:  New/Acute Problems: Sinusitis  No red flags.  Given length of symptoms he would benefit from continued course of antibiotics.  He has had modest improvement with amoxicillin.  Will switch to Augmentin.  He can continue over-the-counter meds otherwise.  Encouraged hydration.  We discussed reasons to return to care.  He will let me know if not proving.  Chronic Problems Addressed Today: Former smoker He does have upcoming appointment for yearly CT scan however with his current respiratory illness he will postpone this by few weeks to prevent any potential false positive readings.  Dyslipidemia Lipids at goal on last lipid panel.  Continue Lipitor 40 mg daily.  Hyperglycemia Last A1c stable 6.1.  He is working on lifestyle modifications.     Subjective:  HPI:  See A/p for status of chronic conditions.  Main concern today is sinus infection. He has been traveling back and forth to [place] to help with his son and his new grand child. A couple of weeks ago he started developing a sore throat and then worsening sinus congestion. No treatments tried for this specifically. Over the last week or so started developing chills and sweats. Earlier this week started taking claritin and ibuprofen. Also started some left over amoxicillin 566m tid. HE has noticed some improvement in his symptoms over the last few days or so. Still has some sinus pressure but improving. Mild cough. Some mild sputum production.  No chest pain or shortness of breath. Symptoms do seem to be worse at night.        Objective:  Physical Exam: BP 124/76   Pulse 93   Temp 97.7 F (36.5 C) (Temporal)   Ht 5' 10"$  (1.778 m)   Wt 208 lb (94.3 kg)   SpO2 96%   BMI 29.84 kg/m   Gen: No acute distress, resting comfortably HEENT: TMs clear.  OP erythematous.  Nasal mucosa erythematous and boggy with clear discharge. CV:  Regular rate and rhythm with no murmurs appreciated Pulm: Normal work of breathing, clear to auscultation bilaterally with no crackles, wheezes, or rhonchi Neuro: Grossly normal, moves all extremities Psych: Normal affect and thought content      Chad Manning M. PJerline Pain MD 02/26/2022 10:53 AM

## 2022-02-26 NOTE — Patient Instructions (Addendum)
It was very nice to see you today!  You have a sinus infection.  Please start the augmentin. Let me know if not improving.   Take care, Dr Jerline Pain  PLEASE NOTE:  If you had any lab tests, please let us know if you have not heard back within a few days. You may see your results on mychart before we have a chance to review them but we will give you a call once they are reviewed by [place].   If we ordered any referrals today, please let us know if you have not heard from their office within the next week.   If you had any urgent prescriptions sent in today, please check with the pharmacy within an hour of our visit to make sure the prescription was transmitted appropriately.   Please try these tips to maintain a healthy lifestyle:  Eat at least 3 REAL meals and 1-2 snacks per day.  Aim for no more than 5 hours between eating.  If you eat breakfast, please do so within one hour of getting up.   Each meal should contain half fruits/vegetables, one quarter protein, and one quarter carbs (no bigger than a computer mouse)  Cut down on sweet beverages. This includes juice, soda, and sweet tea.   Drink at least 1 glass of water with each meal and aim for at least 8 glasses per day  Exercise at least 150 minutes every week.

## 2022-02-26 NOTE — Assessment & Plan Note (Signed)
He does have upcoming appointment for yearly CT scan however with his current respiratory illness he will postpone this by few weeks to prevent any potential false positive readings.

## 2022-03-03 ENCOUNTER — Other Ambulatory Visit: Payer: Medicare PPO

## 2022-03-05 ENCOUNTER — Encounter: Payer: Self-pay | Admitting: Family Medicine

## 2022-03-05 ENCOUNTER — Other Ambulatory Visit (HOSPITAL_COMMUNITY): Payer: Self-pay

## 2022-03-05 ENCOUNTER — Other Ambulatory Visit: Payer: Self-pay

## 2022-03-05 MED ORDER — CLOTRIMAZOLE 10 MG MT TROC
10.0000 mg | Freq: Every day | OROMUCOSAL | 0 refills | Status: AC
  Filled 2022-03-05: qty 7, 1d supply, fill #0
  Filled 2022-03-05: qty 20, 4d supply, fill #0
  Filled 2022-03-05: qty 28, 6d supply, fill #0

## 2022-03-05 NOTE — Telephone Encounter (Signed)
Please advise 

## 2022-03-05 NOTE — Telephone Encounter (Signed)
Rx for clotrimazole sent in. Would like for him to let us know if not improving.  Algis Greenhouse. Jerline Pain, MD 03/05/2022 2:01 PM

## 2022-03-08 ENCOUNTER — Other Ambulatory Visit (HOSPITAL_COMMUNITY): Payer: Self-pay

## 2022-03-11 ENCOUNTER — Ambulatory Visit: Payer: Medicare PPO | Admitting: Family Medicine

## 2022-03-16 ENCOUNTER — Telehealth: Payer: Self-pay | Admitting: Family Medicine

## 2022-03-16 NOTE — Telephone Encounter (Signed)
Contacted Chad Manning to schedule their annual wellness visit. Call back at later date: 06/12/2022  [place] [phone]

## 2022-03-30 ENCOUNTER — Ambulatory Visit
Admission: RE | Admit: 2022-03-30 | Discharge: 2022-03-30 | Disposition: A | Discharge status: Home / Self Care | Payer: Medicare PPO | Source: Ambulatory Visit | Attending: Acute Care | Admitting: Acute Care

## 2022-03-30 DIAGNOSIS — Z87891 Personal history of nicotine dependence: Secondary | ICD-10-CM

## 2022-04-05 ENCOUNTER — Telehealth: Payer: Self-pay | Admitting: *Deleted

## 2022-04-05 DIAGNOSIS — Z122 Encounter for screening for malignant neoplasm of respiratory organs: Secondary | ICD-10-CM

## 2022-04-05 DIAGNOSIS — Z87891 Personal history of nicotine dependence: Secondary | ICD-10-CM

## 2022-04-05 NOTE — Telephone Encounter (Signed)
Spoke with pt regarding CT results. Small lung nodules seen that will be continue to be monitored at yearly scan. Pt reports that he had been sick with sinus issue recently and was treated with 10 days of Augmentin. Pt denies any current respiratory symptoms at this time. CT results sent to Dr Jerline Pain. Order placed for 12 mth lung screening CT.

## 2022-04-06 ENCOUNTER — Telehealth: Payer: Self-pay | Admitting: Family Medicine

## 2022-04-06 NOTE — Telephone Encounter (Signed)
Copied from [place] [phone]. Topic: Medicare AWV >> Apr 06, 2022  3:16 PM Gillis Santa wrote: Reason for CRM: Called patient to schedule Medicare Annual Wellness Visit (AWV). Left message for patient to call back and schedule Medicare Annual Wellness Visit (AWV).  Last date of AWV: n/a  Please schedule an appointment at any time with Otila Kluver, [facility]. Please schedule AWVS with Otila Kluver, [place].  If any questions, please contact me at [phone].  Thank you ,  Shaune Pollack [facility] AWV TEAM Direct Dial [phone]

## 2022-04-11 ENCOUNTER — Other Ambulatory Visit: Payer: Self-pay | Admitting: Family Medicine

## 2022-04-11 DIAGNOSIS — E785 Hyperlipidemia, unspecified: Secondary | ICD-10-CM

## 2022-04-13 ENCOUNTER — Other Ambulatory Visit (HOSPITAL_COMMUNITY): Payer: Self-pay

## 2022-04-13 MED ORDER — ATORVASTATIN CALCIUM 40 MG PO TABS
ORAL_TABLET | ORAL | 1 refills | Status: DC
  Filled 2022-04-13: qty 90, 90d supply, fill #0
  Filled 2022-07-12: qty 90, 90d supply, fill #1

## 2022-06-02 ENCOUNTER — Other Ambulatory Visit (HOSPITAL_COMMUNITY): Payer: Self-pay

## 2022-06-02 ENCOUNTER — Other Ambulatory Visit: Payer: Self-pay | Admitting: Family Medicine

## 2022-06-02 ENCOUNTER — Other Ambulatory Visit (HOSPITAL_BASED_OUTPATIENT_CLINIC_OR_DEPARTMENT_OTHER): Payer: Self-pay | Admitting: Orthopaedic Surgery

## 2022-06-02 MED ORDER — IBUPROFEN 800 MG PO TABS
800.0000 mg | ORAL_TABLET | Freq: Three times a day (TID) | ORAL | 0 refills | Status: AC
  Filled 2022-06-02: qty 30, 10d supply, fill #0

## 2022-07-19 ENCOUNTER — Other Ambulatory Visit (HOSPITAL_BASED_OUTPATIENT_CLINIC_OR_DEPARTMENT_OTHER): Payer: Self-pay

## 2022-07-19 MED ORDER — PNEUMOCOCCAL 20-VAL CONJ VACC 0.5 ML IM SUSY
0.5000 mL | PREFILLED_SYRINGE | Freq: Once | INTRAMUSCULAR | 0 refills | Status: AC
  Filled 2022-07-19: qty 0.5, 1d supply, fill #0

## 2022-07-20 ENCOUNTER — Other Ambulatory Visit (HOSPITAL_COMMUNITY): Payer: Self-pay

## 2022-07-23 ENCOUNTER — Telehealth: Payer: Self-pay | Admitting: Family Medicine

## 2022-07-23 NOTE — Telephone Encounter (Signed)
Patient has initial medicare AWV on 7/16 but can't come in person. Could this be via telephone since it's initial visit.? Please Advise.

## 2022-07-26 NOTE — Telephone Encounter (Signed)
Thank you. Just let me know if patient needs to be changed when able.

## 2022-07-26 NOTE — Telephone Encounter (Signed)
Patient has been informed of this and will be by the phone @ 9:30 am on 7/16.

## 2022-07-27 ENCOUNTER — Ambulatory Visit: Payer: Medicare PPO

## 2022-07-27 VITALS — Wt 208.0 lb

## 2022-07-27 DIAGNOSIS — Z0000 Encounter for general adult medical examination without abnormal findings: Secondary | ICD-10-CM

## 2022-07-27 NOTE — Progress Notes (Signed)
Subjective:   Chad Manning is a 67 y.o. male who presents for an Initial Medicare Annual Wellness Visit.    Per patient no change in vitals since last visit, unable to obtain new vitals due to telehealth visit   Visit Complete: Virtual  I connected with  Gus Rankin on 07/27/22 by a audio enabled telemedicine application and verified that I am speaking with the correct person using two identifiers.  Patient Location: Home  Provider Location: Office/Clinic  I discussed the limitations of evaluation and management by telemedicine. The patient expressed understanding and agreed to proceed.  Patient Medicare AWV questionnaire was completed by the patient on 07/26/22; I have confirmed that all information answered by patient is correct and no changes since this date.  Review of Systems     Cardiac Risk Factors include: advanced age (>37men, >41 women);dyslipidemia;male gender     Objective:    Today's Vitals   07/27/22 0924  Weight: 208 lb (94.3 kg)   Body mass index is 29.84 kg/m.     07/27/2022    9:31 AM 07/16/2021   11:00 AM 07/13/2021    1:36 PM 07/03/2021    9:47 AM  Advanced Directives  Does Patient Have a Medical Advance Directive? Yes Yes Yes Yes  Type of Estate agent of [place];Living will Healthcare Power of [place];Living will Healthcare Power of [place];Living will Healthcare Power of [place];Living will  Does patient want to make changes to medical advance directive?   No - Guardian declined   Copy of Healthcare Power of Attorney in Chart? No - copy requested  No - copy requested     Current Medications (verified) Outpatient Encounter Medications as of 07/27/2022  Medication Sig   acetaminophen (TYLENOL) 500 MG tablet Take 500 mg by mouth every 6 (six) hours as needed.   atorvastatin (LIPITOR) 40 MG tablet TAKE 1 TABLET BY MOUTH DAILY   docusate sodium (COLACE) 100 MG capsule Take 200 mg by mouth daily.   ibuprofen (ADVIL)  200 MG tablet Take 600 mg by mouth every 8 (eight) hours as needed for moderate pain.   amoxicillin (AMOXIL) 500 MG capsule Take 4 capsules 1 hour prior to dental appointment. (Patient not taking: Reported on 07/27/2022)   methocarbamol (ROBAXIN) 500 MG tablet Take 1 tablet (500 mg total) by mouth 4 (four) times daily. (Patient not taking: Reported on 07/27/2022)   [DISCONTINUED] AMBULATORY NON FORMULARY MEDICATION Lift chair.  Dispense 1.  R26.81.   [DISCONTINUED] amoxicillin-clavulanate (AUGMENTIN) 875-125 MG tablet Take 1 tablet by mouth 2 (two) times daily.   [DISCONTINUED] aspirin EC 325 MG tablet Take 1 tablet (325 mg total) by mouth daily.   [DISCONTINUED] diclofenac Sodium (VOLTAREN) 1 % GEL Apply 1 Application topically 4 (four) times daily as needed (knee pain).   [DISCONTINUED] methocarbamol (ROBAXIN) 500 MG tablet Take 2 tablets by mouth every 8 hours as needed for 30 days.   No facility-administered encounter medications on file as of 07/27/2022.    Allergies (verified) Patient has no known allergies.   History: Past Medical History:  Diagnosis Date   Arthritis    Asthma    x 1 age 61   Hyperlipidemia    Right rotator cuff tear    Past Surgical History:  Procedure Laterality Date   COLONOSCOPY     JOINT REPLACEMENT     SHOULDER ARTHROSCOPY WITH ROTATOR CUFF REPAIR AND OPEN BICEPS TENODESIS Right 07/13/2021   Procedure: RIGHT SHOULDER ARTHROSCOPY WITH ROTATOR CUFF REPAIR AND BICEPS  TENODESIS;  Surgeon: Huel Cote, MD;  Location: [facility] OR;  Service: Orthopedics;  Laterality: Right;   TOTAL HIP ARTHROPLASTY     bilatera   Family History  Problem Relation Age of Onset   Heart disease Father    Cancer Mother    Lung cancer Sister        smoker   Colon cancer Neg Hx    Prostate cancer Neg Hx    Esophageal cancer Neg Hx    Rectal cancer Neg Hx    Social History   Socioeconomic History   Marital status: Married    Spouse name: Not on file   Number of children: 2    Years of education: Not on file   Highest education level: Not on file  Occupational History   Not on file  Tobacco Use   Smoking status: Former    Current packs/day: 0.00    Average packs/day: 0.5 packs/day for 40.0 years (20.0 ttl pk-yrs)    Types: Cigarettes    Start date: 29    Quit date: 2010    Years since quitting: 14.5   Smokeless tobacco: Never  Vaping Use   Vaping status: Never Used  Substance and Sexual Activity   Alcohol use: Yes    Alcohol/week: 5.0 - 10.0 standard drinks of alcohol    Types: 5 - 10 Cans of beer per week    Comment: Beer   Drug use: Never   Sexual activity: Yes  Other Topics Concern   Not on file  Social History Narrative   [employer]   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[employer]   Social Determinants of Health   Financial Resource Strain: Low Risk  (07/26/2022)   Overall Financial Resource Strain (CARDIA)    Difficulty of Paying Living Expenses: Not hard at all  Food Insecurity: No Food Insecurity (07/26/2022)   Hunger Vital Sign    Worried About Running Out of Food in the Last Year: Never true    Ran Out of Food in the Last Year: Never true  Transportation Needs: No Transportation Needs (07/26/2022)   PRAPARE - Administrator, Civil Service (Medical): No    Lack of Transportation (Non-Medical): No  Physical Activity: Insufficiently Active (07/26/2022)   Exercise Vital Sign    Days of Exercise per Week: 2 days    Minutes of Exercise per Session: 10 min  Stress: No Stress Concern Present (07/26/2022)   [employer] of Occupational Health - Occupational Stress Questionnaire    Feeling of Stress : Not at all  Social Connections: Moderately Integrated (07/26/2022)   Social Connection and Isolation Panel [NHANES]    Frequency of Communication with Friends and Family: Three times a week    Frequency of Social Gatherings with Friends and Family: Once a week    Attends Religious Services: Never    Database administrator or Organizations: Yes     Attends Banker Meetings: Never    Marital Status: Married    Tobacco Counseling Counseling given: Not Answered   Clinical Intake:  Pre-visit preparation completed: Yes  Pain : No/denies pain     BMI - recorded: 29.84 Nutritional Status: BMI 25 -29 Overweight Nutritional Risks: None Diabetes: No  How often do you need to have someone help you when you read instructions, pamphlets, or other written materials from your doctor or pharmacy?: 1 - Never  Interpreter Needed?: No  Information entered by :: Lanier Ensign, LPN   Activities of Daily Living  07/26/2022    6:47 PM  In your present state of health, do you have any difficulty performing the following activities:  Hearing? 0  Vision? 0  Difficulty concentrating or making decisions? 0  Walking or climbing stairs? 0  Dressing or bathing? 0  Doing errands, shopping? 0  Preparing Food and eating ? N  Using the Toilet? N  In the past six months, have you accidently leaked urine? N  Do you have problems with loss of bowel control? N  Managing your Medications? N  Managing your Finances? N  Housekeeping or managing your Housekeeping? N    Patient Care Team: Ardith Dark, MD as PCP - General (Family Medicine) Ollen Gross, MD as Consulting Physician (Orthopedic Surgery)  Indicate any recent Medical Services you may have received from other than [facility] providers in the past year (date may be approximate).     Assessment:   This is a routine wellness examination for Chad Manning.  Hearing/Vision screen Hearing Screening - Comments:: Pt denies any hearing issues  Vision Screening - Comments:: Pt [facility] for [facility] exams   Dietary issues and exercise activities discussed:     Goals Addressed             This Visit's Progress    Patient Stated       Stay healthy        Depression Screen    07/27/2022    9:30 AM 02/26/2022   10:22 AM 01/15/2022    8:37 AM  01/14/2021    8:41 AM 12/14/2019    9:15 AM 03/15/2018    8:02 AM  PHQ 2/9 Scores  PHQ - 2 Score 0 0 0 0 0 0    Fall Risk    07/26/2022    6:47 PM 02/26/2022   10:22 AM 01/15/2022    8:37 AM 07/10/2021   12:53 PM 01/14/2021    8:42 AM  Fall Risk   Falls in the past year? 0 0 0 1 0  Number falls in past yr:  0 0 0 0  Injury with Fall?  0 0 1 0  Risk for fall due to : Impaired vision  No Fall Risks Impaired balance/gait   Follow up Falls prevention discussed   Education provided;Falls prevention discussed     MEDICARE RISK AT HOME:   TIMED UP AND GO:  Was the test performed? No    Cognitive Function:        07/27/2022    9:32 AM  6CIT Screen  What Year? 0 points  What month? 0 points  What time? 0 points  Count back from 20 0 points  Months in reverse 0 points  Repeat phrase 0 points  Total Score 0 points    Immunizations Immunization History  Administered Date(s) Administered   Fluad Quad(high Dose 65+) 12/02/2020, 12/08/2021   Hepatitis B 05/16/1990, 06/21/1990, 10/20/2016   Moderna SARS-COV2 Booster Vaccination 01/17/2020   Moderna Sars-Covid-2 Vaccination 01/15/2019, 02/12/2019   PNEUMOCOCCAL CONJUGATE-20 07/19/2022   Tdap 08/23/2017    TDAP status: Up to date  Flu Vaccine status: Up to date  Pneumococcal vaccine status: Up to date  Covid-19 vaccine status: Completed vaccines  Qualifies for Shingles Vaccine? Yes   Zostavax completed No   Shingrix Completed?: No.    Education has been provided regarding the importance of this vaccine. Patient has been advised to call insurance company to determine out of pocket expense if they have not yet received  this vaccine. Advised may also receive vaccine at local pharmacy or Health Dept. Verbalized acceptance and understanding.  Screening Tests Health Maintenance  Topic Date Due   Zoster Vaccines- Shingrix (1 of 2) Never done   Hepatitis C Screening  01/16/2023 (Originally 06/19/1973)   INFLUENZA VACCINE  08/12/2022    Lung Cancer Screening  03/30/2023   Medicare Annual Wellness (AWV)  07/27/2023   Colonoscopy  08/10/2025   DTaP/Tdap/Td (2 - Td or Tdap) 08/24/2027   Pneumonia Vaccine 68+ Years old  Completed   HPV VACCINES  Aged Out   COVID-19 Vaccine  Discontinued    Health Maintenance  Health Maintenance Due  Topic Date Due   Zoster Vaccines- Shingrix (1 of 2) Never done    Colorectal cancer screening: Type of screening: Colonoscopy. Completed 08/11/18. Repeat every 7 years  Lung Cancer Screening: (Low Dose CT Chest recommended if Age 66-80 years, 20 pack-year currently smoking OR have quit w/in 15years.) does qualify.   Lung Cancer Screening Referral: ordered 03/30/22 and completed   Additional Screening:  Hepatitis C Screening: does qualify  Vision Screening: Recommended annual ophthalmology exams for early detection of glaucoma and other disorders of the eye. Is the patient up to date with their annual eye exam?  Yes  Who is the provider or what is the name of the office in which the patient attends annual eye exams? [facility]  If pt is not established with a provider, would they like to be referred to a provider to establish care? No .   Dental Screening: Recommended annual dental exams for proper oral hygiene Community Resource Referral / Chronic Care Management: CRR required this visit?  No   CCM required this visit?  No    Plan:     I have personally reviewed and noted the following in the patient's chart:   Medical and social history Use of alcohol, tobacco or illicit drugs  Current medications and supplements including opioid prescriptions. Patient is not currently taking opioid prescriptions. Functional ability and status Nutritional status Physical activity Advanced directives List of other physicians Hospitalizations, surgeries, and ER visits in previous 12 months Vitals Screenings to include cognitive, depression, and falls Referrals and appointments  In  addition, I have reviewed and discussed with patient certain preventive protocols, quality metrics, and best practice recommendations. A written personalized care plan for preventive services as well as general preventive health recommendations were provided to patient.     Marzella Schlein, LPN   1/61/0960   After Visit Summary: (MyChart) Due to this being a telephonic visit, the after visit summary with patients personalized plan was offered to patient via MyChart   Nurse Notes: none

## 2022-07-27 NOTE — Patient Instructions (Signed)
Chad Manning , Thank you for taking time to come for your Medicare Wellness Visit. I appreciate your ongoing commitment to your health goals. Please review the following plan we discussed and let me know if I can assist you in the future.   These are the goals we discussed:  Goals   None     This is a list of the screening recommended for you and due dates:  Health Maintenance  Topic Date Due   Zoster (Shingles) Vaccine (1 of 2) Never done   Hepatitis C Screening  01/16/2023*   Flu Shot  08/12/2022   Screening for Lung Cancer  03/30/2023   Medicare Annual Wellness Visit  07/27/2023   Colon Cancer Screening  08/10/2025   DTaP/Tdap/Td vaccine (2 - Td or Tdap) 08/24/2027   Pneumonia Vaccine  Completed   HPV Vaccine  Aged Out   COVID-19 Vaccine  Discontinued  *Topic was postponed. The date shown is not the original due date.    Advanced directives: Please bring a copy of your health care power of attorney and living will to the office at your convenience.   Conditions/risks identified: stay healthy   Next appointment: Follow up in one year for your annual wellness visit.   Preventive Care 31 Years and Older, Male  Preventive care refers to lifestyle choices and visits with your health care provider that can promote health and wellness. What does preventive care include? A yearly physical exam. This is also called an annual well check. Dental exams once or twice a year. Routine eye exams. Ask your health care provider how often you should have your eyes checked. Personal lifestyle choices, including: Daily care of your teeth and gums. Regular physical activity. Eating a healthy diet. Avoiding tobacco and drug use. Limiting alcohol use. Practicing safe sex. Taking low doses of aspirin every day. Taking vitamin and mineral supplements as recommended by your health care provider. What happens during an annual well check? The services and screenings done by your health care  provider during your annual well check will depend on your age, overall health, lifestyle risk factors, and family history of disease. Counseling  Your health care provider may ask you questions about your: Alcohol use. Tobacco use. Drug use. Emotional well-being. Home and relationship well-being. Sexual activity. Eating habits. History of falls. Memory and ability to understand (cognition). Work and work Astronomer. Screening  You may have the following tests or measurements: Height, weight, and BMI. Blood pressure. Lipid and cholesterol levels. These may be checked every 5 years, or more frequently if you are over 69 years old. Skin check. Lung cancer screening. You may have this screening every year starting at age 4 if you have a 30-pack-year history of smoking and currently smoke or have quit within the past 15 years. Fecal occult blood test (FOBT) of the stool. You may have this test every year starting at age 66. Flexible sigmoidoscopy or colonoscopy. You may have a sigmoidoscopy every 5 years or a colonoscopy every 10 years starting at age 15. Prostate cancer screening. Recommendations will vary depending on your family history and other risks. Hepatitis C blood test. Hepatitis B blood test. Sexually transmitted disease (STD) testing. Diabetes screening. This is done by checking your blood sugar (glucose) after you have not eaten for a while (fasting). You may have this done every 1-3 years. Abdominal aortic aneurysm (AAA) screening. You may need this if you are a current or former smoker. Osteoporosis. You may be screened starting  at age 28 if you are at high risk. Talk with your health care provider about your test results, treatment options, and if necessary, the need for more tests. Vaccines  Your health care provider may recommend certain vaccines, such as: Influenza vaccine. This is recommended every year. Tetanus, diphtheria, and acellular pertussis (Tdap, Td)  vaccine. You may need a Td booster every 10 years. Zoster vaccine. You may need this after age 31. Pneumococcal 13-valent conjugate (PCV13) vaccine. One dose is recommended after age 75. Pneumococcal polysaccharide (PPSV23) vaccine. One dose is recommended after age 63. Talk to your health care provider about which screenings and vaccines you need and how often you need them. This information is not intended to replace advice given to you by your health care provider. Make sure you discuss any questions you have with your health care provider. Document Released: 01/24/2015 Document Revised: 09/17/2015 Document Reviewed: 10/29/2014 Elsevier Interactive Patient Education  2017 [employer].  Fall Prevention in the Home Falls can cause injuries. They can happen to people of all ages. There are many things you can do to make your home safe and to help prevent falls. What can I do on the outside of my home? Regularly fix the edges of walkways and driveways and fix any cracks. Remove anything that might make you trip as you walk through a door, such as a raised step or threshold. Trim any bushes or trees on the path to your home. Use bright outdoor lighting. Clear any walking paths of anything that might make someone trip, such as rocks or tools. Regularly check to see if handrails are loose or broken. Make sure that both sides of any steps have handrails. Any raised decks and porches should have guardrails on the edges. Have any leaves, snow, or ice cleared regularly. Use sand or salt on walking paths during winter. Clean up any spills in your garage right away. This includes oil or grease spills. What can I do in the bathroom? Use night lights. Install grab bars by the toilet and in the tub and shower. Do not use towel bars as grab bars. Use non-skid mats or decals in the tub or shower. If you need to sit down in the shower, use a plastic, non-slip stool. Keep the floor dry. Clean up any  water that spills on the floor as soon as it happens. Remove soap buildup in the tub or shower regularly. Attach bath mats securely with double-sided non-slip rug tape. Do not have throw rugs and other things on the floor that can make you trip. What can I do in the bedroom? Use night lights. Make sure that you have a light by your bed that is easy to reach. Do not use any sheets or blankets that are too big for your bed. They should not hang down onto the floor. Have a firm chair that has side arms. You can use this for support while you get dressed. Do not have throw rugs and other things on the floor that can make you trip. What can I do in the kitchen? Clean up any spills right away. Avoid walking on wet floors. Keep items that you use a lot in easy-to-reach places. If you need to reach something above you, use a strong step stool that has a grab bar. Keep electrical cords out of the way. Do not use floor polish or wax that makes floors slippery. If you must use wax, use non-skid floor wax. Do not have throw rugs  and other things on the floor that can make you trip. What can I do with my stairs? Do not leave any items on the stairs. Make sure that there are handrails on both sides of the stairs and use them. Fix handrails that are broken or loose. Make sure that handrails are as long as the stairways. Check any carpeting to make sure that it is firmly attached to the stairs. Fix any carpet that is loose or worn. Avoid having throw rugs at the top or bottom of the stairs. If you do have throw rugs, attach them to the floor with carpet tape. Make sure that you have a light switch at the top of the stairs and the bottom of the stairs. If you do not have them, ask someone to add them for you. What else can I do to help prevent falls? Wear shoes that: Do not have high heels. Have rubber bottoms. Are comfortable and fit you well. Are closed at the toe. Do not wear sandals. If you use a  stepladder: Make sure that it is fully opened. Do not climb a closed stepladder. Make sure that both sides of the stepladder are locked into place. Ask someone to hold it for you, if possible. Clearly mark and make sure that you can see: Any grab bars or handrails. First and last steps. Where the edge of each step is. Use tools that help you move around (mobility aids) if they are needed. These include: Canes. Walkers. Scooters. Crutches. Turn on the lights when you go into a dark area. Replace any light bulbs as soon as they burn out. Set up your furniture so you have a clear path. Avoid moving your furniture around. If any of your floors are uneven, fix them. If there are any pets around you, be aware of where they are. Review your medicines with your doctor. Some medicines can make you feel dizzy. This can increase your chance of falling. Ask your doctor what other things that you can do to help prevent falls. This information is not intended to replace advice given to you by your health care provider. Make sure you discuss any questions you have with your health care provider. Document Released: 10/24/2008 Document Revised: 06/05/2015 Document Reviewed: 02/01/2014 Elsevier Interactive Patient Education  2017 [employer].

## 2022-08-11 ENCOUNTER — Ambulatory Visit (INDEPENDENT_AMBULATORY_CARE_PROVIDER_SITE_OTHER): Payer: Medicare PPO | Admitting: Orthopaedic Surgery

## 2022-08-11 ENCOUNTER — Ambulatory Visit (INDEPENDENT_AMBULATORY_CARE_PROVIDER_SITE_OTHER): Payer: Medicare PPO

## 2022-08-11 DIAGNOSIS — G8929 Other chronic pain: Secondary | ICD-10-CM | POA: Diagnosis not present

## 2022-08-11 DIAGNOSIS — S46011A Strain of muscle(s) and tendon(s) of the rotator cuff of right shoulder, initial encounter: Secondary | ICD-10-CM | POA: Diagnosis not present

## 2022-08-11 DIAGNOSIS — M25562 Pain in left knee: Secondary | ICD-10-CM | POA: Diagnosis not present

## 2022-08-11 DIAGNOSIS — M1712 Unilateral primary osteoarthritis, left knee: Secondary | ICD-10-CM | POA: Diagnosis not present

## 2022-08-11 NOTE — Progress Notes (Signed)
Post Operative Evaluation    Procedure/Date of Surgery: Left knee pain  Interval History:   Presents today for follow-up of his left knee.  He states that he recently was on a trip to [place] and did experience pain in the medial aspect of the knee particular after being more active.  He has been having medial based knee pain for the last several years.  He has previously been seen by Dr. Denyse Amass.  He is here today for further discussion.  PMH/PSH/Family History/Social History/Meds/Allergies:    Past Medical History:  Diagnosis Date   Arthritis    Asthma    x 1 age 71   Hyperlipidemia    Right rotator cuff tear    Past Surgical History:  Procedure Laterality Date   COLONOSCOPY     JOINT REPLACEMENT     SHOULDER ARTHROSCOPY WITH ROTATOR CUFF REPAIR AND OPEN BICEPS TENODESIS Right 07/13/2021   Procedure: RIGHT SHOULDER ARTHROSCOPY WITH ROTATOR CUFF REPAIR AND BICEPS TENODESIS;  Surgeon: Huel Cote, MD;  Location: [facility] OR;  Service: Orthopedics;  Laterality: Right;   TOTAL HIP ARTHROPLASTY     bilatera   Social History   Socioeconomic History   Marital status: Married    Spouse name: Not on file   Number of children: 2   Years of education: Not on file   Highest education level: Not on file  Occupational History   Not on file  Tobacco Use   Smoking status: Former    Current packs/day: 0.00    Average packs/day: 0.5 packs/day for 40.0 years (20.0 ttl pk-yrs)    Types: Cigarettes    Start date: 88    Quit date: 2010    Years since quitting: 14.5   Smokeless tobacco: Never  Vaping Use   Vaping status: Never Used  Substance and Sexual Activity   Alcohol use: Yes    Alcohol/week: 5.0 - 10.0 standard drinks of alcohol    Types: 5 - 10 Cans of beer per week    Comment: Beer   Drug use: Never   Sexual activity: Yes  Other Topics Concern   Not on file  Social History Narrative   [employer]   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[employer]    Social Determinants of Health   Financial Resource Strain: Low Risk  (07/26/2022)   Overall Financial Resource Strain (CARDIA)    Difficulty of Paying Living Expenses: Not hard at all  Food Insecurity: No Food Insecurity (07/26/2022)   Hunger Vital Sign    Worried About Running Out of Food in the Last Year: Never true    Ran Out of Food in the Last Year: Never true  Transportation Needs: No Transportation Needs (07/26/2022)   PRAPARE - Administrator, Civil Service (Medical): No    Lack of Transportation (Non-Medical): No  Physical Activity: Insufficiently Active (07/26/2022)   Exercise Vital Sign    Days of Exercise per Week: 2 days    Minutes of Exercise per Session: 10 min  Stress: No Stress Concern Present (07/26/2022)   [employer] of Occupational Health - Occupational Stress Questionnaire    Feeling of Stress : Not at all  Social Connections: Moderately Integrated (07/26/2022)   Social Connection and Isolation Panel [NHANES]    Frequency of Communication with Friends and Family: Three times a  week    Frequency of Social Gatherings with Friends and Family: Once a week    Attends Religious Services: Never    Database administrator or Organizations: Yes    Attends Banker Meetings: Never    Marital Status: Married   Family History  Problem Relation Age of Onset   Heart disease Father    Cancer Mother    Lung cancer Sister        smoker   Colon cancer Neg Hx    Prostate cancer Neg Hx    Esophageal cancer Neg Hx    Rectal cancer Neg Hx    No Known Allergies Current Outpatient Medications  Medication Sig Dispense Refill   acetaminophen (TYLENOL) 500 MG tablet Take 500 mg by mouth every 6 (six) hours as needed.     amoxicillin (AMOXIL) 500 MG capsule Take 4 capsules 1 hour prior to dental appointment. (Patient not taking: Reported on 07/27/2022) 16 capsule 1   atorvastatin (LIPITOR) 40 MG tablet TAKE 1 TABLET BY MOUTH DAILY 90 tablet 1    docusate sodium (COLACE) 100 MG capsule Take 200 mg by mouth daily.     ibuprofen (ADVIL) 200 MG tablet Take 600 mg by mouth every 8 (eight) hours as needed for moderate pain.     methocarbamol (ROBAXIN) 500 MG tablet Take 1 tablet (500 mg total) by mouth 4 (four) times daily. (Patient not taking: Reported on 07/27/2022) 30 tablet 3   No current facility-administered medications for this visit.   No results found.  Review of Systems:   A ROS was performed including pertinent positives and negatives as documented in the HPI.   Musculoskeletal Exam:    Left knee tenderness to palpation about the medial joint line posteriorly.  Positive effusion.  Positive McMurray medially.  Negative Lachman, no laxity with varus or valgus stress.  Of motion is from 0-130 without crepitus.  Distal neurosensory exam is intact  Imaging:    X-ray 4 views left knee: Normal  I personally reviewed and interpreted the radiographs.   Assessment:   67 year old male with left knee pain consistent with a possible meniscal tear.  I do believe that at this time an MRI would be needed to rule out any type of underlying meniscal root injury.  I did discuss that this would potentially need to be intervened upon given his persistent pain and difficulty bearing weight.  Will plan for MRI left knee and follow-up discuss results Plan :    -Plan for MRI left knee and follow-up to discuss results     I personally saw and evaluated the patient, and participated in the management and treatment plan.  Huel Cote, MD Attending Physician, Orthopedic Surgery  This document was dictated using Dragon voice recognition software. A reasonable attempt at proof reading has been made to minimize errors.

## 2022-08-17 ENCOUNTER — Encounter (HOSPITAL_BASED_OUTPATIENT_CLINIC_OR_DEPARTMENT_OTHER): Payer: Self-pay | Admitting: Orthopaedic Surgery

## 2022-08-22 ENCOUNTER — Ambulatory Visit
Admission: RE | Admit: 2022-08-22 | Discharge: 2022-08-22 | Disposition: A | Discharge status: Home / Self Care | Payer: Medicare PPO | Source: Ambulatory Visit | Attending: Orthopaedic Surgery | Admitting: Orthopaedic Surgery

## 2022-08-22 DIAGNOSIS — M23332 Other meniscus derangements, other medial meniscus, left knee: Secondary | ICD-10-CM | POA: Diagnosis not present

## 2022-08-22 DIAGNOSIS — M23362 Other meniscus derangements, other lateral meniscus, left knee: Secondary | ICD-10-CM | POA: Diagnosis not present

## 2022-08-22 DIAGNOSIS — M25562 Pain in left knee: Secondary | ICD-10-CM | POA: Diagnosis not present

## 2022-08-22 DIAGNOSIS — M25462 Effusion, left knee: Secondary | ICD-10-CM | POA: Diagnosis not present

## 2022-08-22 DIAGNOSIS — G8929 Other chronic pain: Secondary | ICD-10-CM

## 2022-08-25 ENCOUNTER — Encounter (HOSPITAL_BASED_OUTPATIENT_CLINIC_OR_DEPARTMENT_OTHER): Payer: Self-pay | Admitting: Orthopaedic Surgery

## 2022-08-26 ENCOUNTER — Ambulatory Visit (INDEPENDENT_AMBULATORY_CARE_PROVIDER_SITE_OTHER): Payer: Medicare PPO | Admitting: Orthopaedic Surgery

## 2022-08-26 ENCOUNTER — Other Ambulatory Visit (HOSPITAL_BASED_OUTPATIENT_CLINIC_OR_DEPARTMENT_OTHER): Payer: Self-pay

## 2022-08-26 DIAGNOSIS — S46011A Strain of muscle(s) and tendon(s) of the rotator cuff of right shoulder, initial encounter: Secondary | ICD-10-CM

## 2022-08-26 MED ORDER — OXYCODONE HCL 5 MG PO TABS
5.0000 mg | ORAL_TABLET | ORAL | 0 refills | Status: DC | PRN
  Filled 2022-08-26: qty 10, 2d supply, fill #0

## 2022-08-26 MED ORDER — IBUPROFEN 800 MG PO TABS
800.0000 mg | ORAL_TABLET | Freq: Three times a day (TID) | ORAL | 0 refills | Status: DC
  Filled 2022-08-26: qty 30, 10d supply, fill #0

## 2022-08-26 MED ORDER — ACETAMINOPHEN 500 MG PO TABS
500.0000 mg | ORAL_TABLET | Freq: Three times a day (TID) | ORAL | 0 refills | Status: AC
  Filled 2022-08-26: qty 30, 10d supply, fill #0

## 2022-08-26 MED ORDER — ASPIRIN 325 MG PO TBEC
325.0000 mg | DELAYED_RELEASE_TABLET | Freq: Every day | ORAL | 0 refills | Status: DC
  Filled 2022-08-26: qty 14, 14d supply, fill #0

## 2022-08-26 NOTE — Progress Notes (Signed)
Post Operative Evaluation    Procedure/Date of Surgery: Left knee pain  Interval History:   08/26/2022: Presents today for follow-up of his MRI with persistent left medial knee base pain.  Presents today for follow-up of his left knee.  He states that he recently was on a trip to [place] and did experience pain in the medial aspect of the knee particular after being more active.  He has been having medial based knee pain for the last several years.  He has previously been seen by Dr. Denyse Amass.  He is here today for further discussion.  PMH/PSH/Family History/Social History/Meds/Allergies:    Past Medical History:  Diagnosis Date   Arthritis    Asthma    x 1 age 31   Hyperlipidemia    Right rotator cuff tear    Past Surgical History:  Procedure Laterality Date   COLONOSCOPY     JOINT REPLACEMENT     SHOULDER ARTHROSCOPY WITH ROTATOR CUFF REPAIR AND OPEN BICEPS TENODESIS Right 07/13/2021   Procedure: RIGHT SHOULDER ARTHROSCOPY WITH ROTATOR CUFF REPAIR AND BICEPS TENODESIS;  Surgeon: Huel Cote, MD;  Location: [facility] OR;  Service: Orthopedics;  Laterality: Right;   TOTAL HIP ARTHROPLASTY     bilatera   Social History   Socioeconomic History   Marital status: Married    Spouse name: Not on file   Number of children: 2   Years of education: Not on file   Highest education level: Not on file  Occupational History   Not on file  Tobacco Use   Smoking status: Former    Current packs/day: 0.00    Average packs/day: 0.5 packs/day for 40.0 years (20.0 ttl pk-yrs)    Types: Cigarettes    Start date: 83    Quit date: 2010    Years since quitting: 14.6   Smokeless tobacco: Never  Vaping Use   Vaping status: Never Used  Substance and Sexual Activity   Alcohol use: Yes    Alcohol/week: 5.0 - 10.0 standard drinks of alcohol    Types: 5 - 10 Cans of beer per week    Comment: Beer   Drug use: Never   Sexual activity: Yes  Other Topics Concern    Not on file  Social History Narrative   [employer]   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[employer]   Social Determinants of Health   Financial Resource Strain: Low Risk  (07/26/2022)   Overall Financial Resource Strain (CARDIA)    Difficulty of Paying Living Expenses: Not hard at all  Food Insecurity: No Food Insecurity (07/26/2022)   Hunger Vital Sign    Worried About Running Out of Food in the Last Year: Never true    Ran Out of Food in the Last Year: Never true  Transportation Needs: No Transportation Needs (07/26/2022)   PRAPARE - Administrator, Civil Service (Medical): No    Lack of Transportation (Non-Medical): No  Physical Activity: Insufficiently Active (07/26/2022)   Exercise Vital Sign    Days of Exercise per Week: 2 days    Minutes of Exercise per Session: 10 min  Stress: No Stress Concern Present (07/26/2022)   [employer] of Occupational Health - Occupational Stress Questionnaire    Feeling of Stress : Not at all  Social Connections: Moderately Integrated (07/26/2022)   Social Connection and  Isolation Panel [NHANES]    Frequency of Communication with Friends and Family: Three times a week    Frequency of Social Gatherings with Friends and Family: Once a week    Attends Religious Services: Never    Database administrator or Organizations: Yes    Attends Banker Meetings: Never    Marital Status: Married   Family History  Problem Relation Age of Onset   Heart disease Father    Cancer Mother    Lung cancer Sister        smoker   Colon cancer Neg Hx    Prostate cancer Neg Hx    Esophageal cancer Neg Hx    Rectal cancer Neg Hx    No Known Allergies Current Outpatient Medications  Medication Sig Dispense Refill   acetaminophen (TYLENOL) 500 MG tablet Take 1 tablet (500 mg total) by mouth every 8 (eight) hours for 10 days. 30 tablet 0   aspirin EC 325 MG tablet Take 1 tablet (325 mg total) by mouth daily. 14 tablet 0   ibuprofen (ADVIL) 800 MG tablet  Take 1 tablet (800 mg total) by mouth every 8 (eight) hours for 10 days. Please take with food, please alternate with acetaminophen 30 tablet 0   oxyCODONE (ROXICODONE) 5 MG immediate release tablet Take 1 tablet (5 mg total) by mouth every 4 (four) hours as needed for severe pain or breakthrough pain. 10 tablet 0   acetaminophen (TYLENOL) 500 MG tablet Take 500 mg by mouth every 6 (six) hours as needed.     amoxicillin (AMOXIL) 500 MG capsule Take 4 capsules 1 hour prior to dental appointment. (Patient not taking: Reported on 07/27/2022) 16 capsule 1   atorvastatin (LIPITOR) 40 MG tablet TAKE 1 TABLET BY MOUTH DAILY 90 tablet 1   docusate sodium (COLACE) 100 MG capsule Take 200 mg by mouth daily.     ibuprofen (ADVIL) 200 MG tablet Take 600 mg by mouth every 8 (eight) hours as needed for moderate pain.     methocarbamol (ROBAXIN) 500 MG tablet Take 1 tablet (500 mg total) by mouth 4 (four) times daily. (Patient not taking: Reported on 07/27/2022) 30 tablet 3   No current facility-administered medications for this visit.   No results found.  Review of Systems:   A ROS was performed including pertinent positives and negatives as documented in the HPI.   Musculoskeletal Exam:    Left knee tenderness to palpation about the medial joint line posteriorly.  Positive effusion.  Positive McMurray medially.  Negative Lachman, no laxity with varus or valgus stress.  Of motion is from 0-130 without crepitus.  Distal neurosensory exam is intact  Imaging:    X-ray 4 views left knee: Normal  MRI left knee: There is a radial appearing tear of the medial meniscus with extrusion as well as a lateral horizontal longitudinal tear  I personally reviewed and interpreted the radiographs.   Assessment:   67 year old male with left knee pain in the setting of a medial radial tear with meniscal root extrusion as well as a lateral horizontal tear.  At today's visit I did describe the risk of progression of  osteoarthritis given the meniscal insufficiency.  We did discuss treatment options including injection versus activity restriction although he has already tried this without success.  He has been taking anti-inflammatories with limited relief.  We did discuss the possibility of risk of arthritis in the future at this time he is hoping to avoid this  at all cost.  After long discussion of the risks and limitations he has elected for left knee medial lateral meniscal repair Plan :    -Plan for left knee arthroscopy with medial lateral meniscal repair   After a lengthy discussion of treatment options, including risks, benefits, alternatives, complications of surgical and nonsurgical conservative options, the patient elected surgical repair.   The patient  is aware of the material risks  and complications including, but not limited to injury to adjacent structures, neurovascular injury, infection, numbness, bleeding, implant failure, thermal burns, stiffness, persistent pain, failure to heal, disease transmission from allograft, need for further surgery, dislocation, anesthetic risks, blood clots, risks of death,and others. The probabilities of surgical success and failure discussed with patient given their particular co-morbidities.The time and nature of expected rehabilitation and recovery was discussed.The patient's questions were all answered preoperatively.  No barriers to understanding were noted. I explained the natural history of the disease process and Rx rationale.  I explained to the patient what I considered to be reasonable expectations given their personal situation.  The final treatment plan was arrived at through a shared patient decision making process model.      I personally saw and evaluated the patient, and participated in the management and treatment plan.  Huel Cote, MD Attending Physician, Orthopedic Surgery  This document was dictated using Dragon voice recognition  software. A reasonable attempt at proof reading has been made to minimize errors.

## 2022-08-31 ENCOUNTER — Telehealth: Payer: Self-pay | Admitting: *Deleted

## 2022-08-31 NOTE — Telephone Encounter (Signed)
Please schedule an OV with Dr Jimmey Ralph for surgery clearance

## 2022-09-02 ENCOUNTER — Encounter: Payer: Self-pay | Admitting: Family Medicine

## 2022-09-02 ENCOUNTER — Ambulatory Visit (INDEPENDENT_AMBULATORY_CARE_PROVIDER_SITE_OTHER): Payer: Medicare PPO | Admitting: Family Medicine

## 2022-09-02 VITALS — BP 116/72 | HR 65 | Temp 98.0°F | Ht 70.0 in | Wt 222.8 lb

## 2022-09-02 DIAGNOSIS — E785 Hyperlipidemia, unspecified: Secondary | ICD-10-CM | POA: Diagnosis not present

## 2022-09-02 DIAGNOSIS — R739 Hyperglycemia, unspecified: Secondary | ICD-10-CM

## 2022-09-02 DIAGNOSIS — Z01818 Encounter for other preprocedural examination: Secondary | ICD-10-CM | POA: Diagnosis not present

## 2022-09-02 DIAGNOSIS — M25562 Pain in left knee: Secondary | ICD-10-CM | POA: Diagnosis not present

## 2022-09-02 LAB — COMPREHENSIVE METABOLIC PANEL
ALT: 15 U/L (ref 0–53)
AST: 15 U/L (ref 0–37)
Albumin: 4.1 g/dL (ref 3.5–5.2)
Alkaline Phosphatase: 70 U/L (ref 39–117)
BUN: 16 mg/dL (ref 6–23)
CO2: 26 mEq/L (ref 19–32)
Calcium: 9.4 mg/dL (ref 8.4–10.5)
Chloride: 106 mEq/L (ref 96–112)
Creatinine, Ser: 1.01 mg/dL (ref 0.40–1.50)
GFR: 77.12 mL/min (ref >60.00)
Glucose, Bld: 123 mg/dL — ABNORMAL HIGH (ref 70–99)
Potassium: 4.4 mEq/L (ref 3.5–5.1)
Sodium: 139 mEq/L (ref 135–145)
Total Bilirubin: 0.7 mg/dL (ref 0.2–1.2)
Total Protein: 6.9 g/dL (ref 6.0–8.3)

## 2022-09-02 LAB — CBC
HCT: 38.5 % — ABNORMAL LOW (ref 39.0–52.0)
Hemoglobin: 12.5 g/dL — ABNORMAL LOW (ref 13.0–17.0)
MCHC: 32.5 g/dL (ref 30.0–36.0)
MCV: 87 fl (ref 78.0–100.0)
Platelets: 246 10*3/uL (ref 150.0–400.0)
RBC: 4.43 Mil/uL (ref 4.22–5.81)
RDW: 14.8 % (ref 11.5–15.5)
WBC: 8.4 10*3/uL (ref 4.0–10.5)

## 2022-09-02 LAB — HEMOGLOBIN A1C: Hgb A1c MFr Bld: 6 % (ref 4.6–6.5)

## 2022-09-02 NOTE — Patient Instructions (Addendum)
It was very nice to see you today!  Your EKG today is normal.   We will check your blood work today.  We will fax over your clearance form once we get your blood work back.  Take care, Dr Jimmey Ralph  PLEASE NOTE:  If you had any lab tests, please let us know if you have not heard back within a few days. You may see your results on mychart before we have a chance to review them but we will give you a call once they are reviewed by [place].   If we ordered any referrals today, please let us know if you have not heard from their office within the next week.   If you had any urgent prescriptions sent in today, please check with the pharmacy within an hour of our visit to make sure the prescription was transmitted appropriately.   Please try these tips to maintain a healthy lifestyle:  Eat at least 3 REAL meals and 1-2 snacks per day.  Aim for no more than 5 hours between eating.  If you eat breakfast, please do so within one hour of getting up.   Each meal should contain half fruits/vegetables, one quarter protein, and one quarter carbs (no bigger than a computer mouse)  Cut down on sweet beverages. This includes juice, soda, and sweet tea.   Drink at least 1 glass of water with each meal and aim for at least 8 glasses per day  Exercise at least 150 minutes every week.

## 2022-09-02 NOTE — Progress Notes (Signed)
   Chad Manning is a 68 y.o. male who presents today for an office visit.  Assessment/Plan:  New/Acute Problems: Encounter for surgical clearance Patient overall low risk For complication from surgery.  EKG today shows NSR without any ischemic changes.  He has good functional status.  Will check labs today.  Assuming his labs are normal we will fax surgical clearance form over to his requesting surgeon's office.  Left knee pain Secondary to meniscal tear.  Will be having upcoming repair as above with orthopedics.  Chronic Problems Addressed Today: Dyslipidemia Stable on Lipitor 40 mg daily.  Check lipids next blood draw.  Hyperglycemia Last A1c stable 6.1.  Recheck A1c today as part of his preop labs.     Subjective:  HPI:  See Assessment / plan for status of chronic conditions.  Patient is here today for surgical clearance at the request of Dr Steward Drone.  Patient has been dealing with left knee pain for awhile. He has been following with orthopedics and was recently diagnosed with medial and lateral meniscal tear.  They are planning on arthroscopic repair in the near future.  He needs preop evaluation for this.  Overall is doing well.  Has good functional status.  No chest pain or shortness of breath.        Objective:  Physical Exam: BP 116/72   Pulse 65   Temp 98 F (36.7 C) (Temporal)   Ht 5\' 10"  (1.778 m)   Wt 222 lb 12.8 oz (101.1 kg)   SpO2 95%   BMI 31.97 kg/m   Gen: No acute distress, resting comfortably CV: Regular rate and rhythm with no murmurs appreciated Pulm: Normal work of breathing, clear to auscultation bilaterally with no crackles, wheezes, or rhonchi Neuro: Grossly normal, moves all extremities Psych: Normal affect and thought content  EKG: NSR. No ischemic changes.       Katina Degree. Jimmey Ralph, MD 09/02/2022 7:56 AM

## 2022-09-02 NOTE — Assessment & Plan Note (Signed)
Stable on Lipitor 40 mg daily.  Check lipids next blood draw.

## 2022-09-02 NOTE — Assessment & Plan Note (Signed)
Last A1c stable 6.1.  Recheck A1c today as part of his preop labs.

## 2022-09-02 NOTE — Telephone Encounter (Signed)
I spoke with the patient, and informed him we are currently waiting on clearance from his PCP before we can schedule his surgery. I advised patient I would call him as soon as I received the clearance, and discuss possible surgery dates. Patient understood.

## 2022-09-03 NOTE — Progress Notes (Signed)
His blood counts dropped a little bit.  This should not prevent him from getting his surgery however I would like to make sure that this is stable.  Please have him come back next week to recheck CBC.  A1c is borderline but stable compared to previous values.  The rest of his labs are normal.  We can fax over the rest of his surgical clearance paperwork.

## 2022-09-06 ENCOUNTER — Encounter (HOSPITAL_BASED_OUTPATIENT_CLINIC_OR_DEPARTMENT_OTHER): Payer: Self-pay | Admitting: Orthopaedic Surgery

## 2022-09-07 ENCOUNTER — Other Ambulatory Visit: Payer: Self-pay | Admitting: *Deleted

## 2022-09-07 ENCOUNTER — Other Ambulatory Visit (HOSPITAL_BASED_OUTPATIENT_CLINIC_OR_DEPARTMENT_OTHER): Payer: Self-pay | Admitting: Orthopaedic Surgery

## 2022-09-07 DIAGNOSIS — R7989 Other specified abnormal findings of blood chemistry: Secondary | ICD-10-CM

## 2022-09-07 DIAGNOSIS — G8929 Other chronic pain: Secondary | ICD-10-CM

## 2022-09-08 NOTE — Telephone Encounter (Signed)
I spoke with patient, and scheduled his surgery. Clearance has been received from patient's GP.

## 2022-09-10 ENCOUNTER — Other Ambulatory Visit: Payer: Self-pay | Admitting: *Deleted

## 2022-09-10 ENCOUNTER — Other Ambulatory Visit (INDEPENDENT_AMBULATORY_CARE_PROVIDER_SITE_OTHER): Payer: Medicare PPO

## 2022-09-10 DIAGNOSIS — R7989 Other specified abnormal findings of blood chemistry: Secondary | ICD-10-CM

## 2022-09-10 LAB — CBC
HCT: 39.1 % (ref 39.0–52.0)
Hemoglobin: 12.6 g/dL — ABNORMAL LOW (ref 13.0–17.0)
MCHC: 32.2 g/dL (ref 30.0–36.0)
MCV: 87.8 fl (ref 78.0–100.0)
Platelets: 244 K/uL (ref 150.0–400.0)
RBC: 4.45 Mil/uL (ref 4.22–5.81)
RDW: 15.1 % (ref 11.5–15.5)
WBC: 8.2 K/uL (ref 4.0–10.5)

## 2022-09-10 NOTE — Progress Notes (Signed)
Blood counts are still low. Can we add on a B12 and iron panel?  I would also like for him to check an FOBT to make sure he is not having any signs of internal bleeding.  Katina Degree. Jimmey Ralph, MD 09/10/2022 2:39 PM

## 2022-09-14 ENCOUNTER — Other Ambulatory Visit: Payer: Medicare PPO

## 2022-09-14 DIAGNOSIS — R7989 Other specified abnormal findings of blood chemistry: Secondary | ICD-10-CM

## 2022-09-14 NOTE — Addendum Note (Signed)
Addended by: Lorn Junes on: 09/14/2022 08:53 AM   Modules accepted: Orders

## 2022-09-15 ENCOUNTER — Other Ambulatory Visit: Payer: Self-pay | Admitting: *Deleted

## 2022-09-15 ENCOUNTER — Other Ambulatory Visit (INDEPENDENT_AMBULATORY_CARE_PROVIDER_SITE_OTHER): Payer: Medicare PPO

## 2022-09-15 DIAGNOSIS — E611 Iron deficiency: Secondary | ICD-10-CM

## 2022-09-15 DIAGNOSIS — R7989 Other specified abnormal findings of blood chemistry: Secondary | ICD-10-CM | POA: Diagnosis not present

## 2022-09-15 LAB — VITAMIN B12: Vitamin B-12: 323 pg/mL (ref 200–1100)

## 2022-09-15 LAB — FECAL OCCULT BLOOD, IMMUNOCHEMICAL: Fecal Occult Bld: NEGATIVE

## 2022-09-15 LAB — IRON,TIBC AND FERRITIN PANEL
%SAT: 14 % — ABNORMAL LOW (ref 20–48)
Ferritin: 9 ng/mL — ABNORMAL LOW (ref 24–380)
Iron: 55 ug/dL (ref 50–180)
TIBC: 380 ug/dL (ref 250–425)

## 2022-09-15 NOTE — Progress Notes (Signed)
His iron counts are low.  This indicates that he is probably losing blood somewhere.  We need to find out where it is coming from. Has he completed the FOBT yet?  It is ok for him to start iron supplementation while we determine the source of his blood loss.  Recommend ferrous sulfate 65 mg every other day on an empty stomach.  Katina Degree. Jimmey Ralph, MD 09/15/2022 7:54 AM

## 2022-09-15 NOTE — Progress Notes (Signed)
His stool card was negative for blood.  We do not have a source of his blood loss.  Recommend referral to GI for further evaluation.

## 2022-09-24 ENCOUNTER — Encounter: Payer: Self-pay | Admitting: Internal Medicine

## 2022-09-27 ENCOUNTER — Other Ambulatory Visit: Payer: Self-pay

## 2022-09-27 ENCOUNTER — Encounter (HOSPITAL_BASED_OUTPATIENT_CLINIC_OR_DEPARTMENT_OTHER): Payer: Self-pay | Admitting: Orthopaedic Surgery

## 2022-10-01 ENCOUNTER — Ambulatory Visit (HOSPITAL_BASED_OUTPATIENT_CLINIC_OR_DEPARTMENT_OTHER): Payer: Self-pay | Admitting: Orthopaedic Surgery

## 2022-10-01 DIAGNOSIS — S46011A Strain of muscle(s) and tendon(s) of the rotator cuff of right shoulder, initial encounter: Secondary | ICD-10-CM

## 2022-10-01 DIAGNOSIS — S83282A Other tear of lateral meniscus, current injury, left knee, initial encounter: Secondary | ICD-10-CM

## 2022-10-01 DIAGNOSIS — G8929 Other chronic pain: Secondary | ICD-10-CM

## 2022-10-04 ENCOUNTER — Ambulatory Visit (HOSPITAL_BASED_OUTPATIENT_CLINIC_OR_DEPARTMENT_OTHER)
Admission: RE | Admit: 2022-10-04 | Discharge: 2022-10-04 | Disposition: A | Discharge status: Home / Self Care | Payer: Medicare PPO | Attending: Orthopaedic Surgery | Admitting: Orthopaedic Surgery

## 2022-10-04 ENCOUNTER — Ambulatory Visit (HOSPITAL_BASED_OUTPATIENT_CLINIC_OR_DEPARTMENT_OTHER): Payer: Medicare PPO | Admitting: Certified Registered"

## 2022-10-04 ENCOUNTER — Encounter (HOSPITAL_BASED_OUTPATIENT_CLINIC_OR_DEPARTMENT_OTHER)
Admission: RE | Disposition: A | Discharge status: Home / Self Care | Payer: Self-pay | Source: Home / Self Care | Attending: Orthopaedic Surgery

## 2022-10-04 ENCOUNTER — Encounter (HOSPITAL_BASED_OUTPATIENT_CLINIC_OR_DEPARTMENT_OTHER): Payer: Self-pay | Admitting: Orthopaedic Surgery

## 2022-10-04 ENCOUNTER — Other Ambulatory Visit: Payer: Self-pay

## 2022-10-04 DIAGNOSIS — Z01818 Encounter for other preprocedural examination: Secondary | ICD-10-CM

## 2022-10-04 DIAGNOSIS — S83242A Other tear of medial meniscus, current injury, left knee, initial encounter: Secondary | ICD-10-CM

## 2022-10-04 DIAGNOSIS — X58XXXA Exposure to other specified factors, initial encounter: Secondary | ICD-10-CM | POA: Diagnosis not present

## 2022-10-04 DIAGNOSIS — S83282A Other tear of lateral meniscus, current injury, left knee, initial encounter: Secondary | ICD-10-CM | POA: Diagnosis not present

## 2022-10-04 DIAGNOSIS — Z87891 Personal history of nicotine dependence: Secondary | ICD-10-CM | POA: Insufficient documentation

## 2022-10-04 HISTORY — PX: KNEE ARTHROSCOPY WITH MENISCAL REPAIR: SHX5653

## 2022-10-04 SURGERY — ARTHROSCOPY, KNEE, WITH MENISCUS REPAIR
Anesthesia: Regional | Site: Knee | Laterality: Left

## 2022-10-04 MED ORDER — LIDOCAINE 2% (20 MG/ML) 5 ML SYRINGE
INTRAMUSCULAR | Status: AC
  Filled 2022-10-04: qty 5

## 2022-10-04 MED ORDER — FENTANYL CITRATE (PF) 100 MCG/2ML IJ SOLN
25.0000 ug | INTRAMUSCULAR | Status: DC | PRN
  Administered 2022-10-04: 50 ug via INTRAVENOUS

## 2022-10-04 MED ORDER — FENTANYL CITRATE (PF) 100 MCG/2ML IJ SOLN
50.0000 ug | Freq: Once | INTRAMUSCULAR | Status: AC
  Administered 2022-10-04: 50 ug via INTRAVENOUS

## 2022-10-04 MED ORDER — ACETAMINOPHEN 500 MG PO TABS
1000.0000 mg | ORAL_TABLET | Freq: Once | ORAL | Status: AC
  Administered 2022-10-04: 1000 mg via ORAL

## 2022-10-04 MED ORDER — CEFAZOLIN SODIUM-DEXTROSE 2-4 GM/100ML-% IV SOLN
INTRAVENOUS | Status: AC
  Filled 2022-10-04: qty 100

## 2022-10-04 MED ORDER — MIDAZOLAM HCL 2 MG/2ML IJ SOLN
INTRAMUSCULAR | Status: AC
  Filled 2022-10-04: qty 2

## 2022-10-04 MED ORDER — DEXAMETHASONE SODIUM PHOSPHATE 10 MG/ML IJ SOLN
INTRAMUSCULAR | Status: AC
  Filled 2022-10-04: qty 1

## 2022-10-04 MED ORDER — OXYCODONE HCL 5 MG PO TABS: 5.0000 mg | ORAL_TABLET | Freq: Once | ORAL | Status: DC | PRN

## 2022-10-04 MED ORDER — DEXAMETHASONE SODIUM PHOSPHATE 10 MG/ML IJ SOLN
INTRAMUSCULAR | Status: DC | PRN
  Administered 2022-10-04: 10 mg

## 2022-10-04 MED ORDER — TRANEXAMIC ACID-NACL 1000-0.7 MG/100ML-% IV SOLN
1000.0000 mg | INTRAVENOUS | Status: AC
  Administered 2022-10-04: 1000 mg via INTRAVENOUS

## 2022-10-04 MED ORDER — FENTANYL CITRATE (PF) 100 MCG/2ML IJ SOLN
INTRAMUSCULAR | Status: AC
  Filled 2022-10-04: qty 2

## 2022-10-04 MED ORDER — ONDANSETRON HCL 4 MG/2ML IJ SOLN
INTRAMUSCULAR | Status: AC
  Filled 2022-10-04: qty 2

## 2022-10-04 MED ORDER — PROPOFOL 10 MG/ML IV BOLUS
INTRAVENOUS | Status: DC | PRN
  Administered 2022-10-04: 150 mg via INTRAVENOUS

## 2022-10-04 MED ORDER — FENTANYL CITRATE (PF) 100 MCG/2ML IJ SOLN
INTRAMUSCULAR | Status: DC | PRN
  Administered 2022-10-04 (×4): 50 ug via INTRAVENOUS

## 2022-10-04 MED ORDER — ACETAMINOPHEN 500 MG PO TABS: 1000.0000 mg | ORAL_TABLET | Freq: Once | ORAL | Status: AC

## 2022-10-04 MED ORDER — PHENYLEPHRINE HCL (PRESSORS) 10 MG/ML IV SOLN
INTRAVENOUS | Status: DC | PRN
Start: 2022-10-04 — End: 2022-10-04
  Administered 2022-10-04 (×2): 80 ug via INTRAVENOUS
  Administered 2022-10-04: 160 ug via INTRAVENOUS
  Administered 2022-10-04 (×3): 80 ug via INTRAVENOUS

## 2022-10-04 MED ORDER — TRANEXAMIC ACID-NACL 1000-0.7 MG/100ML-% IV SOLN
INTRAVENOUS | Status: AC
  Filled 2022-10-04: qty 100

## 2022-10-04 MED ORDER — ROPIVACAINE HCL 5 MG/ML IJ SOLN
INTRAMUSCULAR | Status: DC | PRN
Start: 2022-10-04 — End: 2022-10-04
  Administered 2022-10-04: 20 mL via PERINEURAL

## 2022-10-04 MED ORDER — ONDANSETRON HCL 4 MG/2ML IJ SOLN
INTRAMUSCULAR | Status: DC | PRN
  Administered 2022-10-04: 4 mg via INTRAVENOUS

## 2022-10-04 MED ORDER — LIDOCAINE HCL (CARDIAC) PF 100 MG/5ML IV SOSY
PREFILLED_SYRINGE | INTRAVENOUS | Status: DC | PRN
  Administered 2022-10-04: 50 mg via INTRAVENOUS

## 2022-10-04 MED ORDER — DEXAMETHASONE SODIUM PHOSPHATE 4 MG/ML IJ SOLN
INTRAMUSCULAR | Status: DC | PRN
  Administered 2022-10-04: 5 mg via INTRAVENOUS

## 2022-10-04 MED ORDER — GABAPENTIN 300 MG PO CAPS
ORAL_CAPSULE | ORAL | Status: AC
  Filled 2022-10-04: qty 1

## 2022-10-04 MED ORDER — SODIUM CHLORIDE 0.9 % IR SOLN
Status: DC | PRN
  Administered 2022-10-04: 3000 mL

## 2022-10-04 MED ORDER — LACTATED RINGERS IV SOLN: INTRAVENOUS | Status: DC

## 2022-10-04 MED ORDER — EPINEPHRINE PF 1 MG/ML IJ SOLN
INTRAMUSCULAR | Status: AC
  Filled 2022-10-04: qty 2

## 2022-10-04 MED ORDER — ACETAMINOPHEN 500 MG PO TABS
ORAL_TABLET | ORAL | Status: AC
  Filled 2022-10-04: qty 2

## 2022-10-04 MED ORDER — MIDAZOLAM HCL 2 MG/2ML IJ SOLN
2.0000 mg | Freq: Once | INTRAMUSCULAR | Status: AC
  Administered 2022-10-04: 2 mg via INTRAVENOUS

## 2022-10-04 MED ORDER — CEFAZOLIN SODIUM-DEXTROSE 2-4 GM/100ML-% IV SOLN
2.0000 g | INTRAVENOUS | Status: AC
  Administered 2022-10-04: 2 g via INTRAVENOUS

## 2022-10-04 MED ORDER — GABAPENTIN 300 MG PO CAPS
300.0000 mg | ORAL_CAPSULE | Freq: Once | ORAL | Status: AC
  Administered 2022-10-04: 300 mg via ORAL

## 2022-10-04 SURGICAL SUPPLY — 67 items
APL PRP STRL LF DISP 70% ISPRP (MISCELLANEOUS) ×1
BANDAGE ESMARK 6X9 LF (GAUZE/BANDAGES/DRESSINGS) IMPLANT
BLADE EXCALIBUR 4.0X13 (MISCELLANEOUS) IMPLANT
BLADE SURG 15 STRL LF DISP TIS (BLADE) IMPLANT
BLADE SURG 15 STRL SS (BLADE)
BNDG CMPR 5X4 KNIT ELC UNQ LF (GAUZE/BANDAGES/DRESSINGS) ×1
BNDG CMPR 6 X 5 YARDS HK CLSR (GAUZE/BANDAGES/DRESSINGS) ×1
BNDG CMPR 9X6 STRL LF SNTH (GAUZE/BANDAGES/DRESSINGS)
BNDG ELASTIC 4INX 5YD STR LF (GAUZE/BANDAGES/DRESSINGS) ×1 IMPLANT
BNDG ELASTIC 6INX 5YD STR LF (GAUZE/BANDAGES/DRESSINGS) ×1 IMPLANT
BNDG ESMARK 6X9 LF (GAUZE/BANDAGES/DRESSINGS)
CARTRIDGE SUT 2-0 NONSTITCH (Anchor) IMPLANT
CHLORAPREP W/TINT 26 (MISCELLANEOUS) ×1 IMPLANT
COOLER ICEMAN CLASSIC (MISCELLANEOUS) ×1 IMPLANT
CUFF TOURN SGL QUICK 34 (TOURNIQUET CUFF)
CUFF TRNQT CYL 34X4.125X (TOURNIQUET CUFF) IMPLANT
DISSECTOR 3.8MM X 13CM (MISCELLANEOUS) ×1 IMPLANT
DRAPE ARTHROSCOPY W/POUCH 90 (DRAPES) ×1 IMPLANT
DRAPE IMP U-DRAPE 54X76 (DRAPES) ×1 IMPLANT
DRAPE INCISE IOBAN 66X45 STRL (DRAPES) IMPLANT
DRAPE U-SHAPE 47X51 STRL (DRAPES) ×1 IMPLANT
DW OUTFLOW CASSETTE/TUBE SET (MISCELLANEOUS) ×1 IMPLANT
ELECT REM PT RETURN 9FT ADLT (ELECTROSURGICAL)
ELECTRODE REM PT RTRN 9FT ADLT (ELECTROSURGICAL) IMPLANT
EXCALIBUR 3.8MM X 13CM (MISCELLANEOUS) IMPLANT
FASTFIX FLEX CRVD INSERT CANN (Miscellaneous) IMPLANT
GAUZE 4X4 16PLY ~~LOC~~+RFID DBL (SPONGE) IMPLANT
GAUZE PAD ABD 8X10 STRL (GAUZE/BANDAGES/DRESSINGS) ×1 IMPLANT
GAUZE SPONGE 4X4 12PLY STRL (GAUZE/BANDAGES/DRESSINGS) ×1 IMPLANT
GAUZE XEROFORM 1X8 LF (GAUZE/BANDAGES/DRESSINGS) ×1 IMPLANT
GLOVE BIO SURGEON STRL SZ 6 (GLOVE) ×1 IMPLANT
GLOVE BIO SURGEON STRL SZ7.5 (GLOVE) ×1 IMPLANT
GLOVE BIOGEL PI IND STRL 6.5 (GLOVE) ×1 IMPLANT
GLOVE BIOGEL PI IND STRL 8 (GLOVE) ×1 IMPLANT
GOWN STRL REUS W/ TWL LRG LVL3 (GOWN DISPOSABLE) ×2 IMPLANT
GOWN STRL REUS W/ TWL XL LVL3 (GOWN DISPOSABLE) ×1 IMPLANT
GOWN STRL REUS W/TWL LRG LVL3 (GOWN DISPOSABLE) ×2
GOWN STRL REUS W/TWL XL LVL3 (GOWN DISPOSABLE) ×1
INSERT FASTFIX FLEX CRVD CANN (Miscellaneous) IMPLANT
KIT ROOT REPAIR MEINISCAL PEEK (Anchor) IMPLANT
LOOP 2 FIBERLINK CLOSED (SUTURE) IMPLANT
MANAGER SUT NOVOCUT (CUTTER) IMPLANT
MANIFOLD NEPTUNE II (INSTRUMENTS) ×1 IMPLANT
MEINISCAL ROOT REPAIR KIT PEEK (Anchor) ×1 IMPLANT
NDL HYPO 18GX1.5 BLUNT FILL (NEEDLE) ×1 IMPLANT
NDL SAFETY ECLIP 18X1.5 (MISCELLANEOUS) ×1 IMPLANT
NDL SUT 2-0 SCORPION KNEE (NEEDLE) IMPLANT
NEEDLE HYPO 18GX1.5 BLUNT FILL (NEEDLE) ×1
NEEDLE SUT 2-0 SCORPION KNEE (NEEDLE)
NOVOSTICH PRO MENISCAL 2-0 (Miscellaneous) ×1 IMPLANT
PACK ARTHROSCOPY DSU (CUSTOM PROCEDURE TRAY) ×1 IMPLANT
PACK BASIN DAY SURGERY FS (CUSTOM PROCEDURE TRAY) ×1 IMPLANT
PAD COLD SHLDR WRAP-ON (PAD) ×1 IMPLANT
PADDING CAST COTTON 6X4 STRL (CAST SUPPLIES) ×1 IMPLANT
PENCIL SMOKE EVACUATOR (MISCELLANEOUS) IMPLANT
SLEEVE SCD COMPRESS KNEE MED (STOCKING) ×1 IMPLANT
SUCTION TUBE FRAZIER 10FR DISP (SUCTIONS) ×1 IMPLANT
SUT ETHILON 3 0 PS 1 (SUTURE) ×1 IMPLANT
SUT VIC AB 2-0 SH 27 (SUTURE) ×1
SUT VIC AB 2-0 SH 27XBRD (SUTURE) IMPLANT
SUT VIC AB 3-0 FS2 27 (SUTURE) IMPLANT
SYR 5ML LL (SYRINGE) ×1 IMPLANT
SYSTEM NVSTCH PRO MENISCAL 2-0 (Miscellaneous) IMPLANT
TOWEL GREEN STERILE FF (TOWEL DISPOSABLE) ×2 IMPLANT
TRAY DSU PREP LF (CUSTOM PROCEDURE TRAY) ×1 IMPLANT
TUBING ARTHROSCOPY IRRIG 16FT (MISCELLANEOUS) ×1 IMPLANT
WAND ABLATOR APOLLO I90 (BUR) ×1 IMPLANT

## 2022-10-04 NOTE — Op Note (Signed)
Date of Surgery: 10/04/2022  INDICATIONS: Chad Manning is a 67 y.o.-year-old male with lateral and medial meniscal tear of the left knee.  The risk and benefits of the procedure were discussed in detail and documented in the pre-operative evaluation.   PREOPERATIVE DIAGNOSIS: 1.  Left knee medial meniscal root tear, lateral meniscal horizontal cleavage tear  POSTOPERATIVE DIAGNOSIS: Same.  PROCEDURE: 1.  Lateral horizontal tear, medial meniscal root tear  SURGEON: Benancio Deeds MD  ASSISTANT: Kerby Less, ATC  ANESTHESIA:  general plus adductor canal block  IV FLUIDS AND URINE: See anesthesia record.  ANTIBIOTICS: Ancef  ESTIMATED BLOOD LOSS: 5 mL.  IMPLANTS:  Implant Name Type Inv. Item Serial No. Manufacturer Lot No. LRB No. Used Action  CARTRIDGE SUT 2-0 NONSTITCH - [id number] Anchor CARTRIDGE SUT 2-0 NONSTITCH  [employer] [id number] Left 2 Implanted  NOVOSTICH PRO MENISCAL 2-0 - [id number] Miscellaneous NOVOSTICH PRO MENISCAL 2-0  [employer] [id number] Left 1 Implanted  MEINISCAL ROOT REPAIR KIT PEEK - [id number] Anchor MEINISCAL ROOT REPAIR KIT PEEK  [employer] [id number] Left 1 Implanted  CARTRIDGE SUT 2-0 NONSTITCH - [id number] Anchor CARTRIDGE SUT 2-0 NONSTITCH  [employer] [id number] Left 1 Implanted    DRAINS: None  CULTURES: None  COMPLICATIONS: none  DESCRIPTION OF PROCEDURE:   Examination under anesthesia: A careful examination under anesthesia was performed.  Knee ROM motion was: 0-135 Lachman: Normal Pivot Shift: Normal Posterior drawer: normal.   Varus stability in full extension: normal.   Varus stability in 30 degrees of flexion: normal.  Valgus stability in full extension: normal.   Valgus stability in 30 degrees of flexion: normal.  Posterolateral drawer: normal   Intra-operative findings: A thorough arthroscopic examination of the knee was performed.  The findings are: 1. Suprapatellar pouch: Normal 2.  Undersurface of median ridge: Grade 2 cartilage changes 3. Medial patellar facet: Grade 2 cartilage changes 4. Lateral patellar facet: Grade 2 cartilage changes 5. Trochlea: Grade 2 cartilage changes 6. Lateral gutter/popliteus tendon: Normal 7. Hoffa's fat pad: Normal 8. Medial gutter/plica: Normal 9. ACL: Normal 10. PCL: Normal 11. Medial meniscus: Posterior meniscal root tear 12. Medial compartment cartilage: Grade 2 cartilage changes 13. Lateral meniscus: Horizontal cleavage tear involving the posterior body and posterior third junction 14. Lateral compartment cartilage: Grade 3 cartilage changes    I identified the patient in the pre-operative holding area.  I marked the operative knee with my initials. I reviewed the risks and benefits of the proposed surgical intervention and the patient wished to proceed. Anesthesia was then performed with regional block.  The patient was transferred to the operative suite and placed in the supine position with all bony prominences padded.     SCDs were placed on the non-operative lower extremity. Appropriate antibiotics was administered within 1 hour before incision. The operative extremity was then prepped and draped in standard fashion. A time out was performed confirming the correct extremity, correct patient and correct procedure. A tourniquet was placed but not inflated.  Final timeout was performed. Arthroscopy portals were marked and portals were established with an 11 blade.  A diagnostic arthroscopy was performed with findings above.    There was a tear of the media knee medial meniscus involving the posterior third obliquely at the root.  This was probed and given the fact that this came forward it was deemed unstable.  The leg was placed in figure-of-four position.  At this time to nonabsorbable 0 stitches were placed  in the posterior root with FiberWire.  This was done in a luggage tag fashion.  Using the ACL guide a tunnel was then drilled  up to the native root location.  A passing suture was placed through the tunnel.  This was done with a 3.5 mm drill.  The suture was then placed through the tunnel.  Care was taken to drill the tunnel more medially and the ACL tunnel so as to avoid convergence.  Once the luggage tag sutures were passed through the tunnel these had excellent opposition of the native meniscus into the tunnel.  The sutures were placed into a 4.75 mm swivel lock at the aperture of the tunnel just distally.  This was tapped the sutures placed in the anchor and this was subsequently placed.    At this time it was noted that there is also a latera meniscus longitudinal peripheral tear in the posterior third.  This was done in a side-to-side fashion with a Katrinka Blazing and nephew Novo stitch twice.  This provided excellent apposition of the peripheral tear.   The incisions were closed superficially with 2-0 vicryl and 3-0 nylon.Xeroform, gauze, webril, and Ace wrap, Iceman, and brace locked at 0 degrees were applied to the knee.     Instrument, sponge, and needle counts were correct prior to wound closure and at the conclusion of the case.   The patient awoke from anesthesia without difficulty and was transferred to the PACU in stable condition.     POSTOPERATIVE PLAN: Nonweightbearing for total of 2 weeks.  I will plan to see him back in the office for suture removal in 2 weeks.  He will be on aspirin and this time.  Benancio Deeds, MD 2:08 PM

## 2022-10-04 NOTE — Discharge Instructions (Addendum)
Discharge Instructions    Attending Surgeon: Huel Cote, MD Office Phone Number: [phone]   Diagnosis and Procedures:    Surgeries Performed: Left knee medial and lateral meniscal repair  Discharge Plan:    Diet: Resume usual diet. Begin with light or bland foods.  Drink plenty of fluids.  Activity:  Touchdown weight bearing for 2 weeks. You are advised to go home directly from the hospital or surgical center. Restrict your activities.  GENERAL INSTRUCTIONS: 1.  Please apply ice to your wound to help with swelling and inflammation. This will improve your comfort and your overall recovery following surgery.     2. Please call Dr. Serena Croissant office at [phone] with questions Monday-Friday during business hours. If no one answers, please leave a message and someone should get back to the patient within 24 hours. For emergencies please call 911 or proceed to the emergency room.   3. Patient to notify surgical team if experiences any of the following: Bowel/Bladder dysfunction, uncontrolled pain, nerve/muscle weakness, incision with increased drainage or redness, nausea/vomiting and Fever greater than 101.0 F.  Be alert for signs of infection including redness, streaking, odor, fever or chills. Be alert for excessive pain or bleeding and notify your surgeon immediately.  WOUND INSTRUCTIONS:   Leave your dressing, cast, or splint in place until your post operative visit.  Keep it clean and dry.  Always keep the incision clean and dry until the staples/sutures are removed. If there is no drainage from the incision you should keep it open to air. If there is drainage from the incision you must keep it covered at all times until the drainage stops  Do not soak in a bath tub, hot tub, pool, lake or other body of water until 21 days after your surgery and your incision is completely dry and healed.  If you have removable sutures (or staples) they must be removed 10-14 days  (unless otherwise instructed) from the day of your surgery.     1)  Elevate the extremity as much as possible.  2)  Keep the dressing clean and dry.  3)  Please call us if the dressing becomes wet or dirty.  4)  If you are experiencing worsening pain or worsening swelling, please call.     MEDICATIONS: Resume all previous home medications at the previous prescribed dose and frequency unless otherwise noted Start taking the  pain medications on an as-needed basis as prescribed  Please taper down pain medication over the next week following surgery.  Ideally you should not require a refill of any narcotic pain medication.  Take pain medication with food to minimize nausea. In addition to the prescribed pain medication, you may take over-the-counter pain relievers such as Tylenol.  Do NOT take additional tylenol if your pain medication already has tylenol in it.  Aspirin 325mg  daily for four weeks.      FOLLOWUP INSTRUCTIONS: 1. Follow up at the Physical Therapy Clinic 3-4 days following surgery. This appointment should be scheduled unless other arrangements have been made.The Physical Therapy scheduling number is [phone] if an appointment has not already been arranged.  2. Contact Dr. Serena Croissant office during office hours at [phone] or the practice after hours line at [phone] for non-emergencies. For medical emergencies call 911.   Discharge Location: Home     Next dose of Tylenol may be given after 5pm tonight, if needed.   Post Anesthesia Home Care Instructions  Activity: Get plenty of rest  for the remainder of the day. A responsible individual must stay with you for 24 hours following the procedure.  For the next 24 hours, DO NOT: -Drive a car -Advertising copywriter -Drink alcoholic beverages -Take any medication unless instructed by your physician -Make any legal decisions or sign important papers.  Meals: Start with liquid foods such as gelatin or soup.  Progress to regular foods as tolerated. Avoid greasy, spicy, heavy foods. If nausea and/or vomiting occur, drink only clear liquids until the nausea and/or vomiting subsides. Call your physician if vomiting continues.  Special Instructions/Symptoms: Your throat may feel dry or sore from the anesthesia or the breathing tube placed in your throat during surgery. If this causes discomfort, gargle with warm salt water. The discomfort should disappear within 24 hours.  If you had a scopolamine patch placed behind your ear for the management of post- operative nausea and/or vomiting:  1. The medication in the patch is effective for 72 hours, after which it should be removed.  Wrap patch in a tissue and discard in the trash. Wash hands thoroughly with soap and water. 2. You may remove the patch earlier than 72 hours if you experience unpleasant side effects which may include dry mouth, dizziness or visual disturbances. 3. Avoid touching the patch. Wash your hands with soap and water after contact with the patch.   Regional Anesthesia Blocks  1. You may not be able to move or feel the "blocked" extremity after a regional anesthetic block. This may last may last from 3-48 hours after placement, but it will go away. The length of time depends on the medication injected and your individual response to the medication. As the nerves start to wake up, you may experience tingling as the movement and feeling returns to your extremity. If the numbness and inability to move your extremity has not gone away after 48 hours, please call your surgeon.   2. The extremity that is blocked will need to be protected until the numbness is gone and the strength has returned. Because you cannot feel it, you will need to take extra care to avoid injury. Because it may be weak, you may have difficulty moving it or using it. You may not know what position it is in without looking at it while the block is in effect.  3. For blocks in  the legs and feet, returning to weight bearing and walking needs to be done carefully. You will need to wait until the numbness is entirely gone and the strength has returned. You should be able to move your leg and foot normally before you try and bear weight or walk. You will need someone to be with you when you first try to ensure you do not fall and possibly risk injury.  4. Bruising and tenderness at the needle site are common side effects and will resolve in a few days.  5. Persistent numbness or new problems with movement should be communicated to the surgeon or the [facility] [phone] [facility] [phone]).

## 2022-10-04 NOTE — Transfer of Care (Signed)
Immediate Anesthesia Transfer of Care Note  Patient: Chad Manning  Procedure(s) Performed: LEFT KNEE ARTHROSCOPY WITH MEDIAL AND LATERAL MENISCAL REPAIR (Left: Knee)  Patient Location: PACU  Anesthesia Type:General  Level of Consciousness: awake, alert , and oriented  Airway & Oxygen Therapy: Patient Spontanous Breathing and Patient connected to face mask oxygen  Post-op Assessment: Report given to RN and Post -op Vital signs reviewed and stable  Post vital signs: Reviewed and stable  Last Vitals:  Vitals Value Taken Time  BP 138/80 10/04/22 1335  Temp    Pulse 79 10/04/22 1339  Resp 15 10/04/22 1339  SpO2 98 % 10/04/22 1339  Vitals shown include unfiled device data.  Last Pain:  Vitals:   10/04/22 1054  TempSrc: Temporal  PainSc: 0-No pain         Complications: No notable events documented.

## 2022-10-04 NOTE — Interval H&P Note (Signed)
History and Physical Interval Note:  10/04/2022 11:52 AM  Chad Manning  has presented today for surgery, with the diagnosis of LEFT KNEE MEDIAL AND LATERAL MENISCUS TEAR.  The various methods of treatment have been discussed with the patient and family. After consideration of risks, benefits and other options for treatment, the patient has consented to  Procedure(s): LEFT KNEE ARTHROSCOPY WITH MEDIAL AND LATERAL MENISCAL REPAIR (Left) as a surgical intervention.  The patient's history has been reviewed, patient examined, no change in status, stable for surgery.  I have reviewed the patient's chart and labs.  Questions were answered to the patient's satisfaction.     Huel Cote

## 2022-10-04 NOTE — Progress Notes (Signed)
Assisted Dr. Armond Hang with left, adductor canal, ultrasound guided block. Side rails up, monitors on throughout procedure. See vital signs in flow sheet. Tolerated Procedure well.

## 2022-10-04 NOTE — H&P (Signed)
Post Operative Evaluation      Procedure/Date of Surgery: Left knee pain   Interval History:    08/26/2022: Presents today for follow-up of his MRI with persistent left medial knee base pain.   Presents today for follow-up of his left knee.  He states that he recently was on a trip to [place] and did experience pain in the medial aspect of the knee particular after being more active.  He has been having medial based knee pain for the last several years.  He has previously been seen by Dr. Denyse Amass.  He is here today for further discussion.   PMH/PSH/Family History/Social History/Meds/Allergies:         Past Medical History:  Diagnosis Date   Arthritis     Asthma      x 1 age 6   Hyperlipidemia     Right rotator cuff tear               Past Surgical History:  Procedure Laterality Date   COLONOSCOPY       JOINT REPLACEMENT       SHOULDER ARTHROSCOPY WITH ROTATOR CUFF REPAIR AND OPEN BICEPS TENODESIS Right 07/13/2021    Procedure: RIGHT SHOULDER ARTHROSCOPY WITH ROTATOR CUFF REPAIR AND BICEPS TENODESIS;  Surgeon: Huel Cote, MD;  Location: [facility] OR;  Service: Orthopedics;  Laterality: Right;   TOTAL HIP ARTHROPLASTY        bilatera        Social History         Socioeconomic History   Marital status: Married      Spouse name: Not on file   Number of children: 2   Years of education: Not on file   Highest education level: Not on file  Occupational History   Not on file  Tobacco Use   Smoking status: Former      Current packs/day: 0.00      Average packs/day: 0.5 packs/day for 40.0 years (20.0 ttl pk-yrs)      Types: Cigarettes      Start date: 68      Quit date: 2010      Years since quitting: 14.6   Smokeless tobacco: Never  Vaping Use   Vaping status: Never Used  Substance and Sexual Activity   Alcohol use: Yes      Alcohol/week: 5.0 - 10.0 standard drinks of alcohol      Types: 5 - 10 Cans of beer per week      Comment: Beer   Drug use:  Never   Sexual activity: Yes  Other Topics Concern   Not on file  Social History Narrative    [employer]    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[employer]    Social Determinants of Health        Financial Resource Strain: Low Risk  (07/26/2022)    Overall Financial Resource Strain (CARDIA)     Difficulty of Paying Living Expenses: Not hard at all  Food Insecurity: No Food Insecurity (07/26/2022)    Hunger Vital Sign     Worried About Running Out of Food in the Last Year: Never true     Ran Out of Food in the Last Year: Never true  Transportation Needs: No Transportation Needs (07/26/2022)    PRAPARE - Therapist, art (Medical): No     Lack of Transportation (Non-Medical): No  Physical Activity: Insufficiently Active (07/26/2022)    Exercise Vital Sign  Days of Exercise per Week: 2 days     Minutes of Exercise per Session: 10 min  Stress: No Stress Concern Present (07/26/2022)    [employer] of Occupational Health - Occupational Stress Questionnaire     Feeling of Stress : Not at all  Social Connections: Moderately Integrated (07/26/2022)    Social Connection and Isolation Panel [NHANES]     Frequency of Communication with Friends and Family: Three times a week     Frequency of Social Gatherings with Friends and Family: Once a week     Attends Religious Services: Never     Database administrator or Organizations: Yes     Attends Banker Meetings: Never     Marital Status: Married         Family History  Problem Relation Age of Onset   Heart disease Father     Cancer Mother     Lung cancer Sister          smoker   Colon cancer Neg Hx     Prostate cancer Neg Hx     Esophageal cancer Neg Hx     Rectal cancer Neg Hx          Allergies  No Known Allergies         Current Outpatient Medications  Medication Sig Dispense Refill   acetaminophen (TYLENOL) 500 MG tablet Take 1 tablet (500 mg total) by mouth every 8 (eight) hours for 10 days. 30  tablet 0   aspirin EC 325 MG tablet Take 1 tablet (325 mg total) by mouth daily. 14 tablet 0   ibuprofen (ADVIL) 800 MG tablet Take 1 tablet (800 mg total) by mouth every 8 (eight) hours for 10 days. Please take with food, please alternate with acetaminophen 30 tablet 0   oxyCODONE (ROXICODONE) 5 MG immediate release tablet Take 1 tablet (5 mg total) by mouth every 4 (four) hours as needed for severe pain or breakthrough pain. 10 tablet 0   acetaminophen (TYLENOL) 500 MG tablet Take 500 mg by mouth every 6 (six) hours as needed.       amoxicillin (AMOXIL) 500 MG capsule Take 4 capsules 1 hour prior to dental appointment. (Patient not taking: Reported on 07/27/2022) 16 capsule 1   atorvastatin (LIPITOR) 40 MG tablet TAKE 1 TABLET BY MOUTH DAILY 90 tablet 1   docusate sodium (COLACE) 100 MG capsule Take 200 mg by mouth daily.       ibuprofen (ADVIL) 200 MG tablet Take 600 mg by mouth every 8 (eight) hours as needed for moderate pain.       methocarbamol (ROBAXIN) 500 MG tablet Take 1 tablet (500 mg total) by mouth 4 (four) times daily. (Patient not taking: Reported on 07/27/2022) 30 tablet 3      No current facility-administered medications for this visit.      Imaging Results (Last 48 hours)  No results found.     Review of Systems:   A ROS was performed including pertinent positives and negatives as documented in the HPI.     Musculoskeletal Exam:     Left knee tenderness to palpation about the medial joint line posteriorly.  Positive effusion.  Positive McMurray medially.  Negative Lachman, no laxity with varus or valgus stress.  Of motion is from 0-130 without crepitus.  Distal neurosensory exam is intact   Imaging:     X-ray 4 views left knee: Normal   MRI left knee: There is a radial  appearing tear of the medial meniscus with extrusion as well as a lateral horizontal longitudinal tear   I personally reviewed and interpreted the radiographs.     Assessment:   67 year old male  with left knee pain in the setting of a medial radial tear with meniscal root extrusion as well as a lateral horizontal tear.  At today's visit I did describe the risk of progression of osteoarthritis given the meniscal insufficiency.  We did discuss treatment options including injection versus activity restriction although he has already tried this without success.  He has been taking anti-inflammatories with limited relief.  We did discuss the possibility of risk of arthritis in the future at this time he is hoping to avoid this at all cost.  After long discussion of the risks and limitations he has elected for left knee medial lateral meniscal repair Plan :     -Plan for left knee arthroscopy with medial lateral meniscal repair     After a lengthy discussion of treatment options, including risks, benefits, alternatives, complications of surgical and nonsurgical conservative options, the patient elected surgical repair.    The patient  is aware of the material risks  and complications including, but not limited to injury to adjacent structures, neurovascular injury, infection, numbness, bleeding, implant failure, thermal burns, stiffness, persistent pain, failure to heal, disease transmission from allograft, need for further surgery, dislocation, anesthetic risks, blood clots, risks of death,and others. The probabilities of surgical success and failure discussed with patient given their particular co-morbidities.The time and nature of expected rehabilitation and recovery was discussed.The patient's questions were all answered preoperatively.  No barriers to understanding were noted. I explained the natural history of the disease process and Rx rationale.  I explained to the patient what I considered to be reasonable expectations given their personal situation.  The final treatment plan was arrived at through a shared patient decision making process model.           I personally saw and evaluated the  patient, and participated in the management and treatment plan.   Huel Cote, MD Attending Physician, Orthopedic Surgery

## 2022-10-04 NOTE — Anesthesia Preprocedure Evaluation (Addendum)
Anesthesia Evaluation  Patient identified by MRN, date of birth, ID band Patient awake    Reviewed: Allergy & Precautions, NPO status , Patient's Chart, lab work & pertinent test results  Airway Mallampati: II  TM Distance: >3 FB Neck ROM: Full    Dental no notable dental hx. (+) Teeth Intact, Dental Advisory Given   Pulmonary asthma , former smoker   Pulmonary exam normal breath sounds clear to auscultation       Cardiovascular Normal cardiovascular exam Rhythm:Regular Rate:Normal  HLD   Neuro/Psych negative neurological ROS  negative psych ROS   GI/Hepatic negative GI ROS, Neg liver ROS,,,  Endo/Other  negative endocrine ROS    Renal/GU negative Renal ROS  negative genitourinary   Musculoskeletal  (+) Arthritis ,    Abdominal   Peds  Hematology negative hematology ROS (+)   Anesthesia Other Findings   Reproductive/Obstetrics                             Anesthesia Physical Anesthesia Plan  ASA: 2  Anesthesia Plan: General and Regional   Post-op Pain Management: Tylenol PO (pre-op)* and Regional block*   Induction: Intravenous  PONV Risk Score and Plan: 2 and Ondansetron, Dexamethasone and Midazolam  Airway Management Planned: LMA  Additional Equipment:   Intra-op Plan:   Post-operative Plan: Extubation in OR  Informed Consent: I have reviewed the patients History and Physical, chart, labs and discussed the procedure including the risks, benefits and alternatives for the proposed anesthesia with the patient or authorized representative who has indicated his/her understanding and acceptance.     Dental advisory given  Plan Discussed with: CRNA  Anesthesia Plan Comments:        Anesthesia Quick Evaluation

## 2022-10-04 NOTE — Anesthesia Procedure Notes (Signed)
Anesthesia Regional Block: Adductor canal block   Pre-Anesthetic Checklist: , timeout performed,  Correct Patient, Correct Site, Correct Laterality,  Correct Procedure, Correct Position, site marked,  Risks and benefits discussed,  Pre-op evaluation,  At surgeon's request and post-op pain management  Laterality: Left  Prep: Maximum Sterile Barrier Precautions used, chloraprep       Needles:  Injection technique: Single-shot  Needle Type: Echogenic Stimulator Needle     Needle Length: 9cm  Needle Gauge: 21     Additional Needles:   Procedures:,,,, ultrasound used (permanent image in chart),,    Narrative:  Start time: 10/04/2022 11:53 AM End time: 10/04/2022 11:57 AM Injection made incrementally with aspirations every 5 mL. Anesthesiologist: Elmer Picker, MD

## 2022-10-04 NOTE — Anesthesia Procedure Notes (Signed)
Procedure Name: LMA Insertion Date/Time: 10/04/2022 12:24 PM  Performed by: Cleda Clarks, CRNAPre-anesthesia Checklist: Patient identified, Emergency Drugs available, Suction available and Patient being monitored Patient Re-evaluated:Patient Re-evaluated prior to induction Oxygen Delivery Method: Circle system utilized Preoxygenation: Pre-oxygenation with 100% oxygen Induction Type: IV induction Ventilation: Mask ventilation without difficulty LMA: LMA inserted LMA Size: 5.0 Number of attempts: 1 Placement Confirmation: positive ETCO2 Tube secured with: Tape Dental Injury: Teeth and Oropharynx as per pre-operative assessment

## 2022-10-05 ENCOUNTER — Encounter (HOSPITAL_BASED_OUTPATIENT_CLINIC_OR_DEPARTMENT_OTHER): Payer: Self-pay | Admitting: Orthopaedic Surgery

## 2022-10-05 NOTE — Anesthesia Postprocedure Evaluation (Signed)
Anesthesia Post Note  Patient: Chad Manning  Procedure(s) Performed: LEFT KNEE ARTHROSCOPY WITH MEDIAL AND LATERAL MENISCAL REPAIR (Left: Knee)     Patient location during evaluation: PACU Anesthesia Type: Regional and General Level of consciousness: awake and alert Pain management: pain level controlled Vital Signs Assessment: post-procedure vital signs reviewed and stable Respiratory status: spontaneous breathing, nonlabored ventilation, respiratory function stable and patient connected to nasal cannula oxygen Cardiovascular status: blood pressure returned to baseline and stable Postop Assessment: no apparent nausea or vomiting Anesthetic complications: no  No notable events documented.  Last Vitals:  Vitals:   10/04/22 1400 10/04/22 1414  BP: (!) 141/85 (!) 152/90  Pulse: 81 82  Resp: 14 16  Temp:  (!) 36.3 C  SpO2: 96% 95%    Last Pain:  Vitals:   10/05/22 0957  TempSrc:   PainSc: 0-No pain                 Marajade Lei L Sharlyne Koeneman

## 2022-10-08 ENCOUNTER — Encounter (HOSPITAL_BASED_OUTPATIENT_CLINIC_OR_DEPARTMENT_OTHER): Payer: Self-pay | Admitting: Physical Therapy

## 2022-10-08 ENCOUNTER — Ambulatory Visit (HOSPITAL_BASED_OUTPATIENT_CLINIC_OR_DEPARTMENT_OTHER): Payer: Medicare PPO | Attending: Orthopaedic Surgery | Admitting: Physical Therapy

## 2022-10-08 DIAGNOSIS — R262 Difficulty in walking, not elsewhere classified: Secondary | ICD-10-CM | POA: Insufficient documentation

## 2022-10-08 DIAGNOSIS — M25562 Pain in left knee: Secondary | ICD-10-CM | POA: Insufficient documentation

## 2022-10-08 DIAGNOSIS — G8929 Other chronic pain: Secondary | ICD-10-CM | POA: Diagnosis not present

## 2022-10-08 DIAGNOSIS — M25662 Stiffness of left knee, not elsewhere classified: Secondary | ICD-10-CM

## 2022-10-08 DIAGNOSIS — M6281 Muscle weakness (generalized): Secondary | ICD-10-CM | POA: Insufficient documentation

## 2022-10-08 NOTE — Therapy (Incomplete)
OUTPATIENT PHYSICAL THERAPY LOWER EXTREMITY EVALUATION   Patient Name: Chad Manning MRN: [id number] DOB:[birth year], 67 y.o., male Today's Date: 10/09/2022  END OF SESSION:  PT End of Session - 10/08/22 1048     Visit Number 1    Number of Visits 28    Date for PT Re-Evaluation 12/31/22    Authorization Type [employer]    PT Start Time 1024    PT Stop Time 1114    PT Time Calculation (min) 50 min    Equipment Utilized During Treatment --   bilat crutches   Activity Tolerance Patient tolerated treatment well    Behavior During Therapy WFL for tasks assessed/performed             Past Medical History:  Diagnosis Date   Arthritis    Asthma    x 1 age 89   Hyperlipidemia    Right rotator cuff tear    Past Surgical History:  Procedure Laterality Date   COLONOSCOPY     JOINT REPLACEMENT     KNEE ARTHROSCOPY WITH MENISCAL REPAIR Left 10/04/2022   Procedure: LEFT KNEE ARTHROSCOPY WITH MEDIAL AND LATERAL MENISCAL REPAIR;  Surgeon: Huel Cote, MD;  Location: [facility];  Service: Orthopedics;  Laterality: Left;   SHOULDER ARTHROSCOPY WITH ROTATOR CUFF REPAIR AND OPEN BICEPS TENODESIS Right 07/13/2021   Procedure: RIGHT SHOULDER ARTHROSCOPY WITH ROTATOR CUFF REPAIR AND BICEPS TENODESIS;  Surgeon: Huel Cote, MD;  Location: [facility];  Service: Orthopedics;  Laterality: Right;   TOTAL HIP ARTHROPLASTY     bilatera   Patient Active Problem List   Diagnosis Date Noted   Acute lateral meniscus tear of left knee 10/04/2022   Acute medial meniscus tear of left knee 10/04/2022   Traumatic complete tear of right rotator cuff    Former smoker 12/14/2019   Acquired hallux rigidus of right foot 11/29/2018   Dyslipidemia 03/15/2018   Hyperglycemia 03/15/2018     REFERRING PROVIDER: Huel Cote, MD  REFERRING DIAG: [phone] (ICD-10-CM) - Chronic pain of left knee   S/p L knee medial and lateral meniscal repair  THERAPY DIAG:  Muscle weakness  (generalized)  Stiffness of left knee, not elsewhere classified  Difficulty in walking, not elsewhere classified  Left knee pain, unspecified chronicity  Rationale for Evaluation and Treatment: Rehabilitation  ONSET DATE: DOS 10/04/2022  SUBJECTIVE:   SUBJECTIVE STATEMENT: Pt began having L knee pain while on vacation at the beach in June.  He denies any specific MOI.  Pt had x rays in July and MRI in August.  MRI showed medial and lateral meniscal tears.  Pt underwent L knee arthroscopy with medial and lateral meniscal repairs on 10/04/2022.  Post op note indicated NWB'ing for a total of 2 weeks.  Pt is limited with all of his functional mobility skills including ambulation, transfers, and stairs.  Pt has bilat crutches though is primarily using a FWW with ambulation.  Pt unable to perform yard work including being unable to get on the roof.   PERTINENT HISTORY: L knee medial meniscal root repair and lateral meniscal repair on 10/04/22.  NWB'ing x 2 weeks.   R shoulder arthroscopic rotator cuff repair of supraspinatus, infraspinatus, and subscapularis, biceps tenodesis, and subacromial decompression on 07/13/2021.  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 Location:  L knee  PRECAUTIONS: Other: per meniscus repair protocol.  Pt has a hx of bilat THA   WEIGHT BEARING RESTRICTIONS: Yes  NWB'ing  FALLS:  Has patient fallen in last 6 months? No  LIVING ENVIRONMENT: Lives with: lives with their spouse Lives in: 1 story home Stairs: 2 steps without rail to enter home Has following equipment at home: bilat crutches, FWW  OCCUPATION: Pt is retired.  PLOF: Independent.  Pt was ambulating without an AD.  Pt was able to perform his daily activities and functional mobility skills independently.   PATIENT GOALS: return to PLOF  NEXT MD VISIT: 10/20/2022  OBJECTIVE:   DIAGNOSTIC FINDINGS: Pt is post op.  He had x rays and MRI prior to surgery.   This did show tricompartmental degenerative changes.  PATIENT SURVEYS:  LEFS Will give next visit.  COGNITION: Overall cognitive status: Within functional limits for tasks assessed      OBSERVATION:  Pt not in brace and states MD informed him he didn't need brace.  PT will confirm with MD. PT removed post op dressings.  Incision/portals intact with stitches and xeroform gauze over them.  Pt had no signs of infection.  PT applied gauze and tegaderm over incision/portals.  PT educated pt and wife concerning dressings.     LOWER EXTREMITY ROM:   ROM Right eval Left eval  Hip flexion    Hip extension    Hip abduction    Hip adduction    Hip internal rotation    Hip external rotation    Knee flexion 124 PROM 50  Knee extension 0 AROM lacking 7 deg   Ankle dorsiflexion    Ankle plantarflexion    Ankle inversion    Ankle eversion     (Blank rows = not tested)  LOWER EXTREMITY MMT:  Strength not tested due to healing constraints and surgical protocol   GAIT: Comments: PT instructed pt in correct gait pattern with NWB'ing restrictions.  Pt performed a hop to gait pattern with bilat crutches.   TODAY'S TREATMENT:                                                                                                                                 Gait:  PT adjusted appropriate handle height and educated pt and wife with appropriate crutch and handle height.  PT instructed pt in NWB'ing orders.  PT demonstrated and instructed pt in gait sequencing with NWB'ing using hop to gait with bilat crutches.  Pt performed in the clinic with cuing and was able to perform well.   Ther Ex:  pt performed ankle pumps x 20 reps and quad sets with 5 sec hold.  PT gave pt a HEP handout and educated pt in correct form and appropriate frequency.  PT instructed pt he should not have pain with HEP.   PATIENT EDUCATION:  Education details: post op and surgical restrictions and limitations, objective findings,  dx, HEP, POC,  NWB'ing orders, relevant anatomy, dressings, and gait.   Person educated: Patient and Spouse Education method: Explanation, Demonstration, Tactile cues, Verbal  cues, and Handouts Education comprehension: verbalized understanding, returned demonstration, verbal cues required, tactile cues required, and needs further education  HOME EXERCISE PROGRAM: Access Code: [email or web address] URL: [email or web address] Date: 10/08/2022 Prepared by: Aaron Edelman  Exercises - Supine Quadricep Sets  - 2 x daily - 7 x weekly - 2 sets - 10 reps - 5 seconds hold - ANKLE PUMPS  - 7 x weekly  ASSESSMENT:  CLINICAL IMPRESSION: Patient is a 67 y.o. male 4 days s/p L knee medial meniscal root repair and lateral meniscal repair.  Pt has expected post op findings of limited L knee ROM, muscle weakness in L LE, and difficulty in walking.  Pt currently denies L knee pain.  He is limited with all of his functional mobility skills including ambulation, transfers, and stairs.  Pt is using bilat crutches today though is primarily using a FWW with ambulation.  He has NWB'ing restrictions.  Pt is unable to perform yard work.  He should benefit from skilled PT services per protocol to address impairments and to assist in returning to desired level of function.     OBJECTIVE IMPAIRMENTS: Abnormal gait, decreased activity tolerance, decreased endurance, decreased mobility, difficulty walking, decreased ROM, decreased strength, hypomobility, increased edema, impaired flexibility, and pain.   ACTIVITY LIMITATIONS: lifting, bending, standing, squatting, stairs, transfers, and locomotion level  PARTICIPATION LIMITATIONS: meal prep, cleaning, driving, shopping, community activity, and yard work  PERSONAL FACTORS: 1 comorbidity: bilat THA  are also affecting patient's functional outcome.   REHAB POTENTIAL: Good  CLINICAL DECISION MAKING: Stable/uncomplicated  EVALUATION COMPLEXITY:  Low   GOALS:   SHORT TERM GOALS:   Pt will be independent and compliant with HEP for improved strength, ROM, pain, and function.  Baseline: Goal status: INITIAL Target date:  11/05/2022   2.  Pt will demo L knee PROM 0 - 90 deg for improved stiffness and mobility.  Baseline:  Goal status: INITIAL Target date:  11/12/2022   3.  Pt will demo a good quad set and be able to perform supine SLR independently with no > than minimal extensor lag for improved quad strength and to assist with preparing for gait Baseline:  Goal status: INITIAL Target date:  11/05/2022  4.  Pt will progress with exercises per protocol without adverse effects for improved strength and mobility.  Baseline:  Goal status: INITIAL Target date:  11/19/2022   5.  Pt will demo L knee AROM to 0 to 115-120 deg for improved mobility.   Baseline:  Goal status: INITIAL Target date:  12/31/2022  6.  Pt will be able to perform a 6 inch step up with good form and control.  Baseline:  Goal status: INITIAL Target date:  12/17/2022  7.  Pt will ambulate with a normalized heel to toe gait pattern without AD.   Baseline:  Goals status:  INITIAL  Target date:  12/17/2022    LONG TERM GOALS: Target date: 01/27/2022   Pt will be able to perform his ADLs/IADLs without significant pain and difficulty.  Baseline:  Goal status: INITIAL  2.  Pt will be able to perform stairs with a reciprocal gait with the rail. Baseline:  Goal status: INITIAL  3.  Pt will ambulate extended community distance without difficulty and significant pain.  Baseline:  Goal status: INITIAL  4.  Pt will demo 5/5 strength in L hip flex and at least 4+/5 strength in L knee flex and extension for improved performance of and tolerance with functional mobility  Baseline:  Goal status: INITIAL     PLAN:  PT FREQUENCY:  1x/wk x 4 weeks, 1-2x/wk x 2 weeks, and 2x/wk afterwards  PT DURATION: other: 16 weeks  PLANNED INTERVENTIONS:  Therapeutic exercises, Therapeutic activity, Neuromuscular re-education, Balance training, Gait training, Patient/Family education, Self Care, Joint mobilization, Stair training, DME instructions, Aquatic Therapy, Dry Needling, Electrical stimulation, Cryotherapy, Moist heat, scar mobilization, Taping, Ultrasound, Manual therapy, and Re-evaluation  PLAN FOR NEXT SESSION: Cont per Dr. Serena Croissant meniscus repair protocol.  Ice as needed.  Give LEFS next visit.  Referring diagnosis?  M25.562,G89.29 (ICD-10-CM) - Chronic pain of left knee  Treatment diagnosis? (if different than referring diagnosis)  Muscle weakness (generalized)  Stiffness of left knee, not elsewhere classified  Difficulty in walking, not elsewhere classified  Left knee pain, unspecified chronicity  What was this (referring dx) caused by? [x]  Surgery []  Fall []  Ongoing issue []  Arthritis []  Other: ____________  Laterality: []  Rt [x]  Lt []  Both  Check all possible CPT codes:  *CHOOSE 10 OR LESS*    [x]  97110 (Therapeutic Exercise)  []  92507 (SLP Treatment)  [x]  47829 (Neuro Re-ed)   []  92526 (Swallowing Treatment)   [x]  97116 (Gait Training)   []  [id number] (Cognitive Training, 1st 15 minutes) [x]  97140 (Manual Therapy)   []  97130 (Cognitive Training, each add'l 15 minutes)  [x]  97164 (Re-evaluation)                              []  Other, List CPT Code ____________  [x]  97530 (Therapeutic Activities)     [x]  97535 (Self Care)   []  All codes above (97110 - 97535)  []  97012 (Mechanical Traction)  [x]  97014 (E-stim Unattended)  [x]  97032 (E-stim manual)  []  97033 (Ionto)  [x]  97035 (Ultrasound) [x]  97750 (Physical Performance Training) [x]  [id number] (Aquatic Therapy) [x]  97016 (Vasopneumatic Device) []  [id number] (Paraffin) []  97034 (Contrast Bath) []  97597 (Wound Care 1st 20 sq cm) []  97598 (Wound Care each add'l 20 sq cm) []  97760 (Orthotic Fabrication, Fitting, Training Initial) []  [id number] (Prosthetic Management and  Training Initial) []  [id number] (Orthotic or Prosthetic Training/ Modification Subsequent)  Audie Clear III PT, DPT 10/09/22 7:32 AM

## 2022-10-09 ENCOUNTER — Encounter (HOSPITAL_BASED_OUTPATIENT_CLINIC_OR_DEPARTMENT_OTHER): Payer: Self-pay | Admitting: Orthopaedic Surgery

## 2022-10-09 ENCOUNTER — Other Ambulatory Visit: Payer: Self-pay

## 2022-10-11 ENCOUNTER — Other Ambulatory Visit: Payer: Self-pay | Admitting: Family Medicine

## 2022-10-11 ENCOUNTER — Other Ambulatory Visit (HOSPITAL_COMMUNITY): Payer: Self-pay

## 2022-10-11 DIAGNOSIS — E785 Hyperlipidemia, unspecified: Secondary | ICD-10-CM

## 2022-10-11 MED ORDER — ATORVASTATIN CALCIUM 40 MG PO TABS
ORAL_TABLET | ORAL | 1 refills | Status: DC
  Filled 2022-10-11: qty 90, 90d supply, fill #0
  Filled 2023-01-06: qty 90, 90d supply, fill #1

## 2022-10-13 ENCOUNTER — Other Ambulatory Visit (HOSPITAL_COMMUNITY): Payer: Self-pay

## 2022-10-13 ENCOUNTER — Other Ambulatory Visit (HOSPITAL_BASED_OUTPATIENT_CLINIC_OR_DEPARTMENT_OTHER): Payer: Self-pay

## 2022-10-13 ENCOUNTER — Ambulatory Visit (HOSPITAL_BASED_OUTPATIENT_CLINIC_OR_DEPARTMENT_OTHER): Payer: Medicare PPO | Attending: Orthopaedic Surgery | Admitting: Physical Therapy

## 2022-10-13 DIAGNOSIS — M25662 Stiffness of left knee, not elsewhere classified: Secondary | ICD-10-CM | POA: Insufficient documentation

## 2022-10-13 DIAGNOSIS — M6281 Muscle weakness (generalized): Secondary | ICD-10-CM | POA: Diagnosis not present

## 2022-10-13 DIAGNOSIS — R262 Difficulty in walking, not elsewhere classified: Secondary | ICD-10-CM | POA: Diagnosis not present

## 2022-10-13 DIAGNOSIS — M25562 Pain in left knee: Secondary | ICD-10-CM | POA: Insufficient documentation

## 2022-10-13 MED ORDER — FLUAD 0.5 ML IM SUSY
0.5000 mL | PREFILLED_SYRINGE | Freq: Once | INTRAMUSCULAR | 0 refills | Status: AC
  Filled 2022-10-13: qty 0.5, 1d supply, fill #0

## 2022-10-13 NOTE — Therapy (Signed)
OUTPATIENT PHYSICAL THERAPY LOWER EXTREMITY EVALUATION   Patient Name: Chad Manning MRN: [id number] DOB:10-Aug-1955, 67 y.o., male Today's Date: 10/14/2022  END OF SESSION:  PT End of Session - 10/13/22        Visit Number 2    Number of Visits 28     Date for PT Re-Evaluation 12/31/22     Authorization Type [employer]     PT Start Time 1024     PT Stop Time 1112    PT Time Calculation (min) 48 min     Equipment Utilized During Treatment --   bilat crutches    Activity Tolerance Patient tolerated treatment well     Behavior During Therapy WFL for tasks assessed/performed     Past Medical History:  Diagnosis Date   Arthritis    Asthma    x 1 age 21   Hyperlipidemia    Right rotator cuff tear    Past Surgical History:  Procedure Laterality Date   COLONOSCOPY     JOINT REPLACEMENT     KNEE ARTHROSCOPY WITH MENISCAL REPAIR Left 10/04/2022   Procedure: LEFT KNEE ARTHROSCOPY WITH MEDIAL AND LATERAL MENISCAL REPAIR;  Surgeon: Huel Cote, MD;  Location: [facility];  Service: Orthopedics;  Laterality: Left;   SHOULDER ARTHROSCOPY WITH ROTATOR CUFF REPAIR AND OPEN BICEPS TENODESIS Right 07/13/2021   Procedure: RIGHT SHOULDER ARTHROSCOPY WITH ROTATOR CUFF REPAIR AND BICEPS TENODESIS;  Surgeon: Huel Cote, MD;  Location: [facility];  Service: Orthopedics;  Laterality: Right;   TOTAL HIP ARTHROPLASTY     bilatera   Patient Active Problem List   Diagnosis Date Noted   Acute lateral meniscus tear of left knee 10/04/2022   Acute medial meniscus tear of left knee 10/04/2022   Traumatic complete tear of right rotator cuff    Former smoker 12/14/2019   Acquired hallux rigidus of right foot 11/29/2018   Dyslipidemia 03/15/2018   Hyperglycemia 03/15/2018     REFERRING PROVIDER: Huel Cote, MD  REFERRING DIAG: [phone] (ICD-10-CM) - Chronic pain of left knee   S/p L knee medial and lateral meniscal repair  THERAPY DIAG:  Muscle weakness  (generalized)  Stiffness of left knee, not elsewhere classified  Difficulty in walking, not elsewhere classified  Left knee pain, unspecified chronicity  Rationale for Evaluation and Treatment: Rehabilitation  ONSET DATE: DOS 10/04/2022  SUBJECTIVE:   SUBJECTIVE STATEMENT: Pt is 1 week and 2 days s/p L knee medial meniscal root repair and lateral meniscal repair.  Pt reports compliance with HEP.  Pt uses the walker at home and is complaint with NWB'ing restrictions.  Pt is limited with all of his functional mobility skills including ambulation, transfers, and stairs.     PERTINENT HISTORY: L knee medial meniscal root repair and lateral meniscal repair on 10/04/22.  NWB'ing x 2 weeks.   R shoulder arthroscopic rotator cuff repair of supraspinatus, infraspinatus, and subscapularis, biceps tenodesis, and subacromial decompression on 07/13/2021.  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 Location:  L knee  PRECAUTIONS: Other: per meniscus repair protocol.  Pt has a hx of bilat THA   WEIGHT BEARING RESTRICTIONS: Yes NWB'ing  FALLS:  Has patient fallen in last 6 months? No  LIVING ENVIRONMENT: Lives with: lives with their spouse Lives in: 1 story home Stairs: 2 steps without rail to enter home Has following equipment at home: bilat crutches, FWW  OCCUPATION: Pt is retired.  PLOF: Independent.  Pt was  ambulating without an AD.  Pt was able to perform his daily activities and functional mobility skills independently.   PATIENT GOALS: return to PLOF  NEXT MD VISIT: 10/20/2022  OBJECTIVE:   DIAGNOSTIC FINDINGS: Pt is post op.  He had x rays and MRI prior to surgery.  This did show tricompartmental degenerative changes.   TODAY'S TREATMENT:     LOWER EXTREMITY ROM:   ROM Right eval Left eval Left 10/2  Hip flexion     Hip extension     Hip abduction     Hip adduction     Hip internal rotation     Hip external rotation     Knee  flexion 124 PROM 50   Knee extension 0 AROM lacking 7 deg  AROM lacking 4 deg  Ankle dorsiflexion     Ankle plantarflexion     Ankle inversion     Ankle eversion      (Blank rows = not tested)                                                                                                                                OBSERVATION:  Pt's bandages are in place without any drainage seen.  PT changed dressings.  PT applied new gauze and tegaderm over incision and portals.  Ther Ex:   Pt performed:  ankle pumps x 20 reps  quad sets with 5 sec hold 2x10  Supine SLR 2x10  Supine static knee extension stretch with heel propped on half roll x 2 mins   PT gave pt a HEP handout and educated pt in correct form and appropriate frequency.  PT instructed pt he should not have pain with HEP.   Pt received gentle L knee flexion and extension PROM per pt and tissue tolerance w/n protocol ranges.   LEFS:  19/80  PATIENT EDUCATION:  Education details: post op and surgical restrictions and limitations, objective findings, dx, HEP, POC,  NWB'ing orders, relevant anatomy, dressings, and gait.  Pt answered pt and pt's wife's questions.   Person educated: Patient and Spouse Education method: Explanation, Demonstration, Tactile cues, Verbal cues, and Handouts Education comprehension: verbalized understanding, returned demonstration, verbal cues required, tactile cues required, and needs further education  HOME EXERCISE PROGRAM: Access Code: [email or web address] URL: [email or web address] Date: 10/08/2022 Prepared by: Aaron Edelman  Exercises - Supine Quadricep Sets  - 2 x daily - 7 x weekly - 2 sets - 10 reps - 5 seconds hold - ANKLE PUMPS  - 7 x weekly  Updated HEP: - Supine Active Straight Leg Raise  - 1 x daily - 6-7 x weekly - 2 sets - 10 reps - Supine Knee Extension Stretch on Towel Roll  - 2 x daily - 7 x weekly - 1 reps - 2 minutes hold  ASSESSMENT:  CLINICAL IMPRESSION: Pt is  compliant with HEP and NWB'ing restrictions.  PT sent MD a message concerning a brace and MD  responded to PT stating pt did not need a brace.  Pt had good quad activation and performs supine SLR well independently.  PT changed dressings.  Pt's incisions and portals are dry and have no signs of infection.  Pt tolerated PROM well and has good flexion PROM at this time in protocol.  He is limited in extension though demonstrates improved extension ROM as evidenced by goniometric measurements.  He performed exercises per protocol well.  PT updated HEP.  He responded well to Rx having no c/o's and no pain after Rx.  He should benefit from skilled PT services per protocol to address impairments and goals and to assist in returning to desired level of function.     OBJECTIVE IMPAIRMENTS: Abnormal gait, decreased activity tolerance, decreased endurance, decreased mobility, difficulty walking, decreased ROM, decreased strength, hypomobility, increased edema, impaired flexibility, and pain.   ACTIVITY LIMITATIONS: lifting, bending, standing, squatting, stairs, transfers, and locomotion level  PARTICIPATION LIMITATIONS: meal prep, cleaning, driving, shopping, community activity, and yard work  PERSONAL FACTORS: 1 comorbidity: bilat THA  are also affecting patient's functional outcome.   REHAB POTENTIAL: Good  CLINICAL DECISION MAKING: Stable/uncomplicated  EVALUATION COMPLEXITY: Low   GOALS:   SHORT TERM GOALS:   Pt will be independent and compliant with HEP for improved strength, ROM, pain, and function.  Baseline: Goal status: INITIAL Target date:  11/05/2022   2.  Pt will demo L knee PROM 0 - 90 deg for improved stiffness and mobility.  Baseline:  Goal status: INITIAL Target date:  11/12/2022   3.  Pt will demo a good quad set and be able to perform supine SLR independently with no > than minimal extensor lag for improved quad strength and to assist with preparing for gait Baseline:  Goal  status: INITIAL Target date:  11/05/2022  4.  Pt will progress with exercises per protocol without adverse effects for improved strength and mobility.  Baseline:  Goal status: INITIAL Target date:  11/19/2022   5.  Pt will demo L knee AROM to 0 to 115-120 deg for improved mobility.   Baseline:  Goal status: INITIAL Target date:  12/31/2022  6.  Pt will be able to perform a 6 inch step up with good form and control.  Baseline:  Goal status: INITIAL Target date:  12/17/2022  7.  Pt will ambulate with a normalized heel to toe gait pattern without AD.   Baseline:  Goals status:  INITIAL  Target date:  12/17/2022    LONG TERM GOALS: Target date: 01/27/2022   Pt will be able to perform his ADLs/IADLs without significant pain and difficulty.  Baseline:  Goal status: INITIAL  2.  Pt will be able to perform stairs with a reciprocal gait with the rail. Baseline:  Goal status: INITIAL  3.  Pt will ambulate extended community distance without difficulty and significant pain.  Baseline:  Goal status: INITIAL  4.  Pt will demo 5/5 strength in L hip flex and at least 4+/5 strength in L knee flex and extension for improved performance of and tolerance with functional mobility  Baseline:  Goal status: INITIAL     PLAN:  PT FREQUENCY:  1x/wk x 4 weeks, 1-2x/wk x 2 weeks, and 2x/wk afterwards  PT DURATION: other: 16 weeks  PLANNED INTERVENTIONS: Therapeutic exercises, Therapeutic activity, Neuromuscular re-education, Balance training, Gait training, Patient/Family education, Self Care, Joint mobilization, Stair training, DME instructions, Aquatic Therapy, Dry Needling, Electrical stimulation, Cryotherapy, Moist heat, scar mobilization, Taping, Ultrasound, Manual  therapy, and Re-evaluation  PLAN FOR NEXT SESSION: Cont per Dr. Serena Croissant meniscus repair protocol.  Ice as needed.      Audie Clear III PT, DPT 10/14/22 10:01 PM

## 2022-10-14 ENCOUNTER — Encounter (HOSPITAL_BASED_OUTPATIENT_CLINIC_OR_DEPARTMENT_OTHER): Payer: Self-pay | Admitting: Physical Therapy

## 2022-10-14 ENCOUNTER — Encounter (HOSPITAL_BASED_OUTPATIENT_CLINIC_OR_DEPARTMENT_OTHER): Payer: Self-pay | Admitting: Orthopaedic Surgery

## 2022-10-20 ENCOUNTER — Ambulatory Visit (HOSPITAL_BASED_OUTPATIENT_CLINIC_OR_DEPARTMENT_OTHER): Payer: Medicare PPO | Admitting: Physical Therapy

## 2022-10-20 ENCOUNTER — Ambulatory Visit (HOSPITAL_BASED_OUTPATIENT_CLINIC_OR_DEPARTMENT_OTHER): Payer: Medicare PPO | Admitting: Orthopaedic Surgery

## 2022-10-20 ENCOUNTER — Other Ambulatory Visit: Payer: Self-pay | Admitting: Family Medicine

## 2022-10-20 ENCOUNTER — Other Ambulatory Visit (HOSPITAL_COMMUNITY): Payer: Self-pay

## 2022-10-20 ENCOUNTER — Other Ambulatory Visit (HOSPITAL_BASED_OUTPATIENT_CLINIC_OR_DEPARTMENT_OTHER): Payer: Self-pay

## 2022-10-20 ENCOUNTER — Other Ambulatory Visit (HOSPITAL_BASED_OUTPATIENT_CLINIC_OR_DEPARTMENT_OTHER): Payer: Self-pay | Admitting: Orthopaedic Surgery

## 2022-10-20 DIAGNOSIS — M6281 Muscle weakness (generalized): Secondary | ICD-10-CM | POA: Diagnosis not present

## 2022-10-20 DIAGNOSIS — M25562 Pain in left knee: Secondary | ICD-10-CM

## 2022-10-20 DIAGNOSIS — S83282A Other tear of lateral meniscus, current injury, left knee, initial encounter: Secondary | ICD-10-CM

## 2022-10-20 DIAGNOSIS — R262 Difficulty in walking, not elsewhere classified: Secondary | ICD-10-CM

## 2022-10-20 DIAGNOSIS — M25662 Stiffness of left knee, not elsewhere classified: Secondary | ICD-10-CM

## 2022-10-20 MED ORDER — IBUPROFEN 800 MG PO TABS
800.0000 mg | ORAL_TABLET | Freq: Three times a day (TID) | ORAL | 0 refills | Status: DC | PRN
  Filled 2022-10-20: qty 30, 10d supply, fill #0

## 2022-10-20 MED ORDER — IBUPROFEN 800 MG PO TABS
800.0000 mg | ORAL_TABLET | Freq: Three times a day (TID) | ORAL | 0 refills | Status: AC
  Filled 2022-10-20: qty 30, 10d supply, fill #0

## 2022-10-20 NOTE — Therapy (Incomplete)
OUTPATIENT PHYSICAL THERAPY TREATMENT NOTE   Patient Name: Chad Manning MRN: [id number] DOB:[birth year], 67 y.o., male Today's Date: 10/21/2022  END OF SESSION:  PT End of Session - 10/20/22 1406     Visit Number 3    Number of Visits 28    Date for PT Re-Evaluation 12/31/22    Authorization Type [employer]    PT Start Time 1320    PT Stop Time 1400    PT Time Calculation (min) 40 min    Equipment Utilized During Treatment Other (comment)   bilat crutches   Activity Tolerance Patient tolerated treatment well    Behavior During Therapy WFL for tasks assessed/performed                Past Medical History:  Diagnosis Date   Arthritis    Asthma    x 1 age 89   Hyperlipidemia    Right rotator cuff tear    Past Surgical History:  Procedure Laterality Date   COLONOSCOPY     JOINT REPLACEMENT     KNEE ARTHROSCOPY WITH MENISCAL REPAIR Left 10/04/2022   Procedure: LEFT KNEE ARTHROSCOPY WITH MEDIAL AND LATERAL MENISCAL REPAIR;  Surgeon: Huel Cote, MD;  Location: [facility];  Service: Orthopedics;  Laterality: Left;   SHOULDER ARTHROSCOPY WITH ROTATOR CUFF REPAIR AND OPEN BICEPS TENODESIS Right 07/13/2021   Procedure: RIGHT SHOULDER ARTHROSCOPY WITH ROTATOR CUFF REPAIR AND BICEPS TENODESIS;  Surgeon: Huel Cote, MD;  Location: [facility];  Service: Orthopedics;  Laterality: Right;   TOTAL HIP ARTHROPLASTY     bilatera   Patient Active Problem List   Diagnosis Date Noted   Acute lateral meniscus tear of left knee 10/04/2022   Acute medial meniscus tear of left knee 10/04/2022   Traumatic complete tear of right rotator cuff    Former smoker 12/14/2019   Acquired hallux rigidus of right foot 11/29/2018   Dyslipidemia 03/15/2018   Hyperglycemia 03/15/2018     REFERRING PROVIDER: Huel Cote, MD  REFERRING DIAG: [phone] (ICD-10-CM) - Chronic pain of left knee   S/p L knee medial and lateral meniscal repair  THERAPY DIAG:  Muscle weakness  (generalized)  Stiffness of left knee, not elsewhere classified  Difficulty in walking, not elsewhere classified  Left knee pain, unspecified chronicity  Rationale for Evaluation and Treatment: Rehabilitation  ONSET DATE: DOS 10/04/2022  SUBJECTIVE:   SUBJECTIVE STATEMENT: Pt is 2 weeks and 2 days s/p L knee medial meniscal root repair and lateral meniscal repair.  Pt states he has been performing his home exercises though has not performed the supine knee extension stretch as much.  Pt uses the walker at home and is complaint with NWB'ing restrictions.  Pt states his R shoulder is a little sore from using the AD.   Pt is limited with all of his functional mobility skills including ambulation, transfers, and stairs.     PERTINENT HISTORY: L knee medial meniscal root repair and lateral meniscal repair on 10/04/22.  NWB'ing x 2 weeks.   R shoulder arthroscopic rotator cuff repair of supraspinatus, infraspinatus, and subscapularis, biceps tenodesis, and subacromial decompression on 07/13/2021.  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 Location:  L knee  PRECAUTIONS: Other: per meniscus repair protocol.  Pt has a hx of bilat THA   WEIGHT BEARING RESTRICTIONS: Yes NWB'ing  FALLS:  Has patient fallen in last 6 months? No  LIVING ENVIRONMENT: Lives with: lives with their spouse  Lives in: 1 story home Stairs: 2 steps without rail to enter home Has following equipment at home: bilat crutches, FWW  OCCUPATION: Pt is retired.  PLOF: Independent.  Pt was ambulating without an AD.  Pt was able to perform his daily activities and functional mobility skills independently.   PATIENT GOALS: return to PLOF  NEXT MD VISIT: 10/20/2022  OBJECTIVE:   DIAGNOSTIC FINDINGS: Pt is post op.  He had x rays and MRI prior to surgery.  This did show tricompartmental degenerative changes.   TODAY'S TREATMENT:     LOWER EXTREMITY ROM:   ROM Right eval  Left eval Left 10/2 Left 10/9  Hip flexion      Hip extension      Hip abduction      Hip adduction      Hip internal rotation      Hip external rotation      Knee flexion 124 PROM 50  AAROM:  85-90 deg  Knee extension 0 AROM lacking 7 deg  AROM lacking 4 deg AROM lacking 4 deg  Ankle dorsiflexion      Ankle plantarflexion      Ankle inversion      Ankle eversion       (Blank rows = not tested)                Ther Ex:   Pt performed:  quad sets with 5 sec hold 2x10  Supine SLR 2x10  Supine static knee extension stretch with heel propped on half roll x 2 mins Supine heel slides with strap 2x10 w/n protocol range   PT gave pt a HEP handout and educated pt in correct form and appropriate frequency.  PT instructed pt he should not have pain with HEP.   Pt received gentle L knee flexion and extension PROM per pt and tissue tolerance w/n protocol ranges.     PATIENT EDUCATION:  Education details:  PT educated pt in appropriate ROM per protocol.  PT instructed pt to not perform knee flexion AAROM past 90 deg or into a painful or tight range.  PT used contralateral knee bent to 90 deg as a visual cue.   post op and surgical restrictions and limitations, objective findings, dx, HEP, POC,  NWB'ing orders, relevant anatomy, dressings, and gait.  Pt answered pt's questions.   Person educated: Patient and Spouse Education method: Explanation, Demonstration, Tactile cues, Verbal cues, and Handouts Education comprehension: verbalized understanding, returned demonstration, verbal cues required, tactile cues required, and needs further education  HOME EXERCISE PROGRAM: Access Code: [email or web address] URL: [email or web address] Date: 10/08/2022 Prepared by: Aaron Edelman  Exercises - Supine Quadricep Sets  - 2 x daily - 7 x weekly - 2 sets - 10 reps - 5 seconds hold - ANKLE PUMPS  - 7 x weekly - Supine Active Straight Leg Raise  - 1 x daily - 6-7 x weekly - 2 sets - 10 reps - Supine  Knee Extension Stretch on Towel Roll  - 2 x daily - 7 x weekly - 1 reps - 2 minutes hold   Updated HEP: - Supine Heel Slide with Strap w/n protocol range - 2 x daily - 7 x weekly - 2 sets - 10 reps  ASSESSMENT:  CLINICAL IMPRESSION: Pt has excellent knee flexion ROM at this time in protocol.  He has some limitation in knee extension ROM which is unchanged from prior Rx.  He demonstrated 1 deg improvement in knee extension (to 3 deg) after supine  static stretch.  Pt performed exercises per protocol well.  PT educated pt with appropriate performance and range with supine heel slide with strap.  Pt demonstrated good form with exercise and understands appropriate range.  Pt performs NWB'ing gait well with bilat crutches.  He responded well to Rx having no pain and no c/o's after Rx.  He should benefit from continued skilled PT services per protocol to address impairments and goals and to assist in returning to desired level of function.     OBJECTIVE IMPAIRMENTS: Abnormal gait, decreased activity tolerance, decreased endurance, decreased mobility, difficulty walking, decreased ROM, decreased strength, hypomobility, increased edema, impaired flexibility, and pain.   ACTIVITY LIMITATIONS: lifting, bending, standing, squatting, stairs, transfers, and locomotion level  PARTICIPATION LIMITATIONS: meal prep, cleaning, driving, shopping, community activity, and yard work  PERSONAL FACTORS: 1 comorbidity: bilat THA  are also affecting patient's functional outcome.   REHAB POTENTIAL: Good  CLINICAL DECISION MAKING: Stable/uncomplicated  EVALUATION COMPLEXITY: Low   GOALS:   SHORT TERM GOALS:   Pt will be independent and compliant with HEP for improved strength, ROM, pain, and function.  Baseline: Goal status: INITIAL Target date:  11/05/2022   2.  Pt will demo L knee PROM 0 - 90 deg for improved stiffness and mobility.  Baseline:  Goal status: INITIAL Target date:  11/12/2022   3.  Pt  will demo a good quad set and be able to perform supine SLR independently with no > than minimal extensor lag for improved quad strength and to assist with preparing for gait Baseline:  Goal status: INITIAL Target date:  11/05/2022  4.  Pt will progress with exercises per protocol without adverse effects for improved strength and mobility.  Baseline:  Goal status: INITIAL Target date:  11/19/2022   5.  Pt will demo L knee AROM to 0 to 115-120 deg for improved mobility.   Baseline:  Goal status: INITIAL Target date:  12/31/2022  6.  Pt will be able to perform a 6 inch step up with good form and control.  Baseline:  Goal status: INITIAL Target date:  12/17/2022  7.  Pt will ambulate with a normalized heel to toe gait pattern without AD.   Baseline:  Goals status:  INITIAL  Target date:  12/17/2022    LONG TERM GOALS: Target date: 01/27/2022   Pt will be able to perform his ADLs/IADLs without significant pain and difficulty.  Baseline:  Goal status: INITIAL  2.  Pt will be able to perform stairs with a reciprocal gait with the rail. Baseline:  Goal status: INITIAL  3.  Pt will ambulate extended community distance without difficulty and significant pain.  Baseline:  Goal status: INITIAL  4.  Pt will demo 5/5 strength in L hip flex and at least 4+/5 strength in L knee flex and extension for improved performance of and tolerance with functional mobility  Baseline:  Goal status: INITIAL     PLAN:  PT FREQUENCY:  1x/wk x 4 weeks, 1-2x/wk x 2 weeks, and 2x/wk afterwards  PT DURATION: other: 16 weeks  PLANNED INTERVENTIONS: Therapeutic exercises, Therapeutic activity, Neuromuscular re-education, Balance training, Gait training, Patient/Family education, Self Care, Joint mobilization, Stair training, DME instructions, Aquatic Therapy, Dry Needling, Electrical stimulation, Cryotherapy, Moist heat, scar mobilization, Taping, Ultrasound, Manual therapy, and Re-evaluation  PLAN  FOR NEXT SESSION: Cont per Dr. Serena Croissant meniscus repair protocol.  Ice as needed.  Pt sees MD after PT appt.      Audie Clear III PT,  DPT 10/21/22 9:05 PM

## 2022-10-20 NOTE — Progress Notes (Signed)
Post Operative Evaluation    Procedure/Date of Surgery: left knee medial/lateral meniscal repair 9/23  Interval History:   Presents 2 weeks status post above procedure.  Overall he is doing very well.  Has been compliant with nonweightbearing.   PMH/PSH/Family History/Social History/Meds/Allergies:    Past Medical History:  Diagnosis Date   Arthritis    Asthma    x 1 age 67   Hyperlipidemia    Right rotator cuff tear    Past Surgical History:  Procedure Laterality Date   COLONOSCOPY     JOINT REPLACEMENT     KNEE ARTHROSCOPY WITH MENISCAL REPAIR Left 10/04/2022   Procedure: LEFT KNEE ARTHROSCOPY WITH MEDIAL AND LATERAL MENISCAL REPAIR;  Surgeon: Huel Cote, MD;  Location: [facility];  Service: Orthopedics;  Laterality: Left;   SHOULDER ARTHROSCOPY WITH ROTATOR CUFF REPAIR AND OPEN BICEPS TENODESIS Right 07/13/2021   Procedure: RIGHT SHOULDER ARTHROSCOPY WITH ROTATOR CUFF REPAIR AND BICEPS TENODESIS;  Surgeon: Huel Cote, MD;  Location: [facility];  Service: Orthopedics;  Laterality: Right;   TOTAL HIP ARTHROPLASTY     bilatera   Social History   Socioeconomic History   Marital status: Married    Spouse name: Not on file   Number of children: 2   Years of education: Not on file   Highest education level: Bachelor's degree (e.g., BA, AB, BS)  Occupational History   Not on file  Tobacco Use   Smoking status: Former    Current packs/day: 0.00    Average packs/day: 0.5 packs/day for 40.0 years (20.0 ttl pk-yrs)    Types: Cigarettes    Start date: 50    Quit date: 2010    Years since quitting: 14.7   Smokeless tobacco: Never  Vaping Use   Vaping status: Never Used  Substance and Sexual Activity   Alcohol use: Yes    Alcohol/week: 5.0 - 10.0 standard drinks of alcohol    Types: 5 - 10 Cans of beer per week    Comment: Beer   Drug use: Never   Sexual activity: Yes  Other Topics Concern   Not on file  Social  History Narrative   [employer]   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[employer]   Social Determinants of Health   Financial Resource Strain: Low Risk  (08/31/2022)   Overall Financial Resource Strain (CARDIA)    Difficulty of Paying Living Expenses: Not hard at all  Food Insecurity: No Food Insecurity (08/31/2022)   Hunger Vital Sign    Worried About Running Out of Food in the Last Year: Never true    Ran Out of Food in the Last Year: Never true  Transportation Needs: No Transportation Needs (08/31/2022)   PRAPARE - Administrator, Civil Service (Medical): No    Lack of Transportation (Non-Medical): No  Physical Activity: Insufficiently Active (08/31/2022)   Exercise Vital Sign    Days of Exercise per Week: 2 days    Minutes of Exercise per Session: 20 min  Stress: No Stress Concern Present (08/31/2022)   [employer] of Occupational Health - Occupational Stress Questionnaire    Feeling of Stress : Not at all  Social Connections: Moderately Integrated (08/31/2022)   Social Connection and Isolation Panel [NHANES]    Frequency of Communication with Friends and Family: More than three times a  week    Frequency of Social Gatherings with Friends and Family: Once a week    Attends Religious Services: 1 to 4 times per year    Active Member of [employer] or Organizations: No    Attends Engineer, structural: Never    Marital Status: Married   Family History  Problem Relation Age of Onset   Heart disease Father    Cancer Mother    Lung cancer Sister        smoker   Colon cancer Neg Hx    Prostate cancer Neg Hx    Esophageal cancer Neg Hx    Rectal cancer Neg Hx    No Known Allergies Current Outpatient Medications  Medication Sig Dispense Refill   aspirin EC 325 MG tablet Take 325 mg by mouth daily.     atorvastatin (LIPITOR) 40 MG tablet TAKE 1 TABLET BY MOUTH DAILY 90 tablet 1   docusate sodium (COLACE) 100 MG capsule Take 200 mg by mouth daily.     ibuprofen (ADVIL) 800 MG  tablet Take 1 tablet (800 mg total) by mouth every 8 (eight) hours for 10 days. Please take with food, please alternate with acetaminophen 30 tablet 0   ibuprofen (ADVIL) 800 MG tablet Take 1 tablet (800 mg total) by mouth every 8 (eight) hours as needed. 30 tablet 0   No current facility-administered medications for this visit.   No results found.  Review of Systems:   A ROS was performed including pertinent positives and negatives as documented in the HPI.   Musculoskeletal Exam:    There were no vitals taken for this visit. Left knee incisions are well-appearing without erythema or drainage.  He has no joint line tenderness.  Range of motion is from 0 to 90 degrees.  Sensation is intact distally throughout Imaging:      I personally reviewed and interpreted the radiographs.   Assessment:   2 weeks status post left knee medial lateral meniscal repair overall doing very well.  At this time he will advance his weightbearing to as tolerated.  I will plan to see him back in 4 weeks for reassessment  Plan :    -Return to clinic 4 weeks for reassessment      I personally saw and evaluated the patient, and participated in the management and treatment plan.  Huel Cote, MD Attending Physician, Orthopedic Surgery  This document was dictated using Dragon voice recognition software. A reasonable attempt at proof reading has been made to minimize errors.

## 2022-10-21 ENCOUNTER — Encounter (HOSPITAL_BASED_OUTPATIENT_CLINIC_OR_DEPARTMENT_OTHER): Payer: Self-pay | Admitting: Physical Therapy

## 2022-10-25 ENCOUNTER — Other Ambulatory Visit (HOSPITAL_BASED_OUTPATIENT_CLINIC_OR_DEPARTMENT_OTHER): Payer: Self-pay

## 2022-10-25 ENCOUNTER — Other Ambulatory Visit (HOSPITAL_BASED_OUTPATIENT_CLINIC_OR_DEPARTMENT_OTHER): Payer: Self-pay | Admitting: Orthopaedic Surgery

## 2022-10-26 ENCOUNTER — Other Ambulatory Visit (HOSPITAL_COMMUNITY): Payer: Self-pay

## 2022-10-26 ENCOUNTER — Other Ambulatory Visit (HOSPITAL_BASED_OUTPATIENT_CLINIC_OR_DEPARTMENT_OTHER): Payer: Self-pay | Admitting: Orthopaedic Surgery

## 2022-10-27 ENCOUNTER — Other Ambulatory Visit (HOSPITAL_COMMUNITY): Payer: Self-pay

## 2022-10-27 ENCOUNTER — Other Ambulatory Visit: Payer: Self-pay

## 2022-10-27 ENCOUNTER — Ambulatory Visit (HOSPITAL_BASED_OUTPATIENT_CLINIC_OR_DEPARTMENT_OTHER): Payer: Medicare PPO | Admitting: Physical Therapy

## 2022-10-27 DIAGNOSIS — M6281 Muscle weakness (generalized): Secondary | ICD-10-CM | POA: Diagnosis not present

## 2022-10-27 DIAGNOSIS — M25562 Pain in left knee: Secondary | ICD-10-CM | POA: Diagnosis not present

## 2022-10-27 DIAGNOSIS — M25662 Stiffness of left knee, not elsewhere classified: Secondary | ICD-10-CM | POA: Diagnosis not present

## 2022-10-27 DIAGNOSIS — R262 Difficulty in walking, not elsewhere classified: Secondary | ICD-10-CM

## 2022-10-27 MED ORDER — METHOCARBAMOL 500 MG PO TABS
500.0000 mg | ORAL_TABLET | Freq: Four times a day (QID) | ORAL | 3 refills | Status: DC
  Filled 2022-10-27: qty 30, 8d supply, fill #0
  Filled 2022-11-01: qty 30, 8d supply, fill #1
  Filled 2023-02-21: qty 30, 8d supply, fill #2
  Filled 2023-08-11: qty 30, 8d supply, fill #3

## 2022-10-27 NOTE — Therapy (Signed)
OUTPATIENT PHYSICAL THERAPY TREATMENT NOTE   Patient Name: Chad Manning MRN: [id number] DOB:Jun 05, 1955, 67 y.o., male Today's Date: 10/28/2022  END OF SESSION:  PT End of Session - 10/27/22 1033     Visit Number 4    Number of Visits 28    Date for PT Re-Evaluation 12/31/22    Authorization Type [employer]    PT Start Time 1022    PT Stop Time 1103    PT Time Calculation (min) 41 min    Activity Tolerance Patient tolerated treatment well    Behavior During Therapy WFL for tasks assessed/performed                 Past Medical History:  Diagnosis Date   Arthritis    Asthma    x 1 age 3   Hyperlipidemia    Right rotator cuff tear    Past Surgical History:  Procedure Laterality Date   COLONOSCOPY     JOINT REPLACEMENT     KNEE ARTHROSCOPY WITH MENISCAL REPAIR Left 10/04/2022   Procedure: LEFT KNEE ARTHROSCOPY WITH MEDIAL AND LATERAL MENISCAL REPAIR;  Surgeon: Huel Cote, MD;  Location: [facility];  Service: Orthopedics;  Laterality: Left;   SHOULDER ARTHROSCOPY WITH ROTATOR CUFF REPAIR AND OPEN BICEPS TENODESIS Right 07/13/2021   Procedure: RIGHT SHOULDER ARTHROSCOPY WITH ROTATOR CUFF REPAIR AND BICEPS TENODESIS;  Surgeon: Huel Cote, MD;  Location: [facility];  Service: Orthopedics;  Laterality: Right;   TOTAL HIP ARTHROPLASTY     bilatera   Patient Active Problem List   Diagnosis Date Noted   Acute lateral meniscus tear of left knee 10/04/2022   Acute medial meniscus tear of left knee 10/04/2022   Traumatic complete tear of right rotator cuff    Former smoker 12/14/2019   Acquired hallux rigidus of right foot 11/29/2018   Dyslipidemia 03/15/2018   Hyperglycemia 03/15/2018     REFERRING PROVIDER: Huel Cote, MD  REFERRING DIAG: [phone] (ICD-10-CM) - Chronic pain of left knee   S/p L knee medial and lateral meniscal repair  THERAPY DIAG:  Muscle weakness (generalized)  Stiffness of left knee, not elsewhere  classified  Difficulty in walking, not elsewhere classified  Left knee pain, unspecified chronicity  Rationale for Evaluation and Treatment: Rehabilitation  ONSET DATE: DOS 10/04/2022  SUBJECTIVE:   SUBJECTIVE STATEMENT: Pt is 3 weeks and 2 days s/p L knee medial meniscal root repair and lateral meniscal repair.  Pt saw MD who had him walk in the clinic without AD and instructed him he is WBAT.  Pt used 1 crutch for a couple of days and is now ambulating without crutches.  Pt reports no pain ambulating without crutches.  MD note indicated to advance WB'ing as tolerated.  He has felt his L hip some.     PERTINENT HISTORY: L knee medial meniscal root repair and lateral meniscal repair on 10/04/22.  WBAT   R shoulder arthroscopic rotator cuff repair of supraspinatus, infraspinatus, and subscapularis, biceps tenodesis, and subacromial decompression on 07/13/2021.  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 Location:  L knee  PRECAUTIONS: Other: per meniscus repair protocol.  Pt has a hx of bilat THA   WEIGHT BEARING RESTRICTIONS: Yes WBAT  FALLS:  Has patient fallen in last 6 months? No  LIVING ENVIRONMENT: Lives with: lives with their spouse Lives in: 1 story home Stairs: 2 steps without rail to enter home Has following equipment at home: bilat  crutches, FWW  OCCUPATION: Pt is retired.  PLOF: Independent.  Pt was ambulating without an AD.  Pt was able to perform his daily activities and functional mobility skills independently.   PATIENT GOALS: return to PLOF  NEXT MD VISIT: 10/20/2022  OBJECTIVE:   DIAGNOSTIC FINDINGS: Pt is post op.  He had x rays and MRI prior to surgery.  This did show tricompartmental degenerative changes.   TODAY'S TREATMENT:     LOWER EXTREMITY ROM:   ROM Right eval Left eval Left 10/2 Left 10/9 Right 10/16 Left 10/16  Hip flexion        Hip extension        Hip abduction        Hip adduction         Hip internal rotation        Hip external rotation        Knee flexion 124 PROM 50  AAROM:  85-90 deg    Knee extension 0 AROM lacking 7 deg  AROM lacking 4 deg AROM lacking 4 deg Lacking 1 deg Lacking 5 deg  Ankle dorsiflexion        Ankle plantarflexion        Ankle inversion        Ankle eversion         (Blank rows = not tested)                Ther Ex:   Pt performed:  quad sets with 5 sec hold x10  Supine SLR 3x10  Supine static knee extension stretch with heel propped on half roll x 2 mins Supine heel slides with strap 2x10 w/n protocol range S/L Hip abd with pillow b/w knees 2x10 Longsitting gastroc stretch with strap 3x20-30 sec   PT gave pt a HEP handout and educated pt in correct form and appropriate frequency.  PT instructed pt he should not have pain with HEP.   Pt received sup and inferior patellar mobs f/b L knee flexion and extension PROM per pt and tissue tolerance w/n protocol ranges.   Gait:  Pt ambulates with a limp favoring L LE though reports no knee pain with ambulation.  He has limited TKE on L.    PATIENT EDUCATION:  Education details:  appropriate ROM per protocol, post op and surgical restrictions and limitations, objective findings, dx, HEP, POC, relevant anatomy, and gait.  Pt answered pt's questions.   Person educated: Patient Education method: Explanation, Demonstration, Tactile cues, Verbal cues, and Handouts Education comprehension: verbalized understanding, returned demonstration, verbal cues required, tactile cues required, and needs further education  HOME EXERCISE PROGRAM: Access Code: [email or web address] URL: [email or web address] Date: 10/08/2022 Prepared by: Aaron Edelman  Exercises - Supine Quadricep Sets  - 2 x daily - 7 x weekly - 2 sets - 10 reps - 5 seconds hold - ANKLE PUMPS  - 7 x weekly - Supine Active Straight Leg Raise  - 1 x daily - 6-7 x weekly - 2 sets - 10 reps - Supine Knee Extension Stretch on Towel Roll  - 2 x  daily - 7 x weekly - 1 reps - 2 minutes hold - Supine Heel Slide with Strap w/n protocol range - 2 x daily - 7 x weekly - 2 sets - 10 reps   Updated HEP: - Sidelying Hip Abduction  - 1 x daily - 7 x weekly - 2 sets - 10 reps - Long Sitting Calf Stretch with Strap  - 2 x daily -  7 x weekly - 2-3 reps - 20-30 second hold  ASSESSMENT:  CLINICAL IMPRESSION: Pt saw MD and his Wb'ing was progressed to WBAT.  Pt enters the clinic without any crutches and reports he is ambulating without crutches without pain.  Pt has a limp with gait though reports no pain with gait.  Pt continues to have limited knee extension ROM and was slightly worse by 1 deg today.  Pt has good flexion ROM at this time in protocol.  Pt tolerates knee PROM well.  He performed exercises per protocol well without c/o's.  PT updated HEP and pt demonstrates good understanding.  He responded well to Rx having no pain after Rx.  He should benefit from continued skilled PT services per protocol to address impairments and goals and to assist in returning to desired level of function.       OBJECTIVE IMPAIRMENTS: Abnormal gait, decreased activity tolerance, decreased endurance, decreased mobility, difficulty walking, decreased ROM, decreased strength, hypomobility, increased edema, impaired flexibility, and pain.   ACTIVITY LIMITATIONS: lifting, bending, standing, squatting, stairs, transfers, and locomotion level  PARTICIPATION LIMITATIONS: meal prep, cleaning, driving, shopping, community activity, and yard work  PERSONAL FACTORS: 1 comorbidity: bilat THA  are also affecting patient's functional outcome.   REHAB POTENTIAL: Good  CLINICAL DECISION MAKING: Stable/uncomplicated  EVALUATION COMPLEXITY: Low   GOALS:   SHORT TERM GOALS:   Pt will be independent and compliant with HEP for improved strength, ROM, pain, and function.  Baseline: Goal status: INITIAL Target date:  11/05/2022   2.  Pt will demo L knee PROM 0 - 90  deg for improved stiffness and mobility.  Baseline:  Goal status: INITIAL Target date:  11/12/2022   3.  Pt will demo a good quad set and be able to perform supine SLR independently with no > than minimal extensor lag for improved quad strength and to assist with preparing for gait Baseline:  Goal status: INITIAL Target date:  11/05/2022  4.  Pt will progress with exercises per protocol without adverse effects for improved strength and mobility.  Baseline:  Goal status: INITIAL Target date:  11/19/2022   5.  Pt will demo L knee AROM to 0 to 115-120 deg for improved mobility.   Baseline:  Goal status: INITIAL Target date:  12/31/2022  6.  Pt will be able to perform a 6 inch step up with good form and control.  Baseline:  Goal status: INITIAL Target date:  12/17/2022  7.  Pt will ambulate with a normalized heel to toe gait pattern without AD.   Baseline:  Goals status:  INITIAL  Target date:  12/17/2022    LONG TERM GOALS: Target date: 01/27/2022   Pt will be able to perform his ADLs/IADLs without significant pain and difficulty.  Baseline:  Goal status: INITIAL  2.  Pt will be able to perform stairs with a reciprocal gait with the rail. Baseline:  Goal status: INITIAL  3.  Pt will ambulate extended community distance without difficulty and significant pain.  Baseline:  Goal status: INITIAL  4.  Pt will demo 5/5 strength in L hip flex and at least 4+/5 strength in L knee flex and extension for improved performance of and tolerance with functional mobility  Baseline:  Goal status: INITIAL     PLAN:  PT FREQUENCY:  1x/wk x 4 weeks, 1-2x/wk x 2 weeks, and 2x/wk afterwards  PT DURATION: other: 16 weeks  PLANNED INTERVENTIONS: Therapeutic exercises, Therapeutic activity, Neuromuscular re-education, Balance training,  Gait training, Patient/Family education, Self Care, Joint mobilization, Stair training, DME instructions, Aquatic Therapy, Dry Needling, Electrical  stimulation, Cryotherapy, Moist heat, scar mobilization, Taping, Ultrasound, Manual therapy, and Re-evaluation  PLAN FOR NEXT SESSION: Cont per Dr. Serena Croissant meniscus repair protocol.  Ice as needed.        Audie Clear III PT, DPT 10/28/22 9:09 PM

## 2022-10-28 ENCOUNTER — Encounter (HOSPITAL_BASED_OUTPATIENT_CLINIC_OR_DEPARTMENT_OTHER): Payer: Self-pay | Admitting: Physical Therapy

## 2022-11-02 ENCOUNTER — Other Ambulatory Visit (HOSPITAL_COMMUNITY): Payer: Self-pay

## 2022-11-02 ENCOUNTER — Other Ambulatory Visit: Payer: Self-pay

## 2022-11-03 ENCOUNTER — Ambulatory Visit (HOSPITAL_BASED_OUTPATIENT_CLINIC_OR_DEPARTMENT_OTHER): Payer: Medicare PPO | Admitting: Physical Therapy

## 2022-11-03 DIAGNOSIS — M6281 Muscle weakness (generalized): Secondary | ICD-10-CM | POA: Diagnosis not present

## 2022-11-03 DIAGNOSIS — M25662 Stiffness of left knee, not elsewhere classified: Secondary | ICD-10-CM | POA: Diagnosis not present

## 2022-11-03 DIAGNOSIS — R262 Difficulty in walking, not elsewhere classified: Secondary | ICD-10-CM | POA: Diagnosis not present

## 2022-11-03 DIAGNOSIS — M25562 Pain in left knee: Secondary | ICD-10-CM

## 2022-11-03 NOTE — Therapy (Signed)
OUTPATIENT PHYSICAL THERAPY TREATMENT NOTE   Patient Name: Chad Manning MRN: [id number] DOB:May 12, 1955, 67 y.o., male Today's Date: 11/04/2022  END OF SESSION:  PT End of Session - 11/03/22 1412     Visit Number 5    Number of Visits 28    Date for PT Re-Evaluation 12/31/22    Authorization Type [employer]    PT Start Time 1408    PT Stop Time 1448    PT Time Calculation (min) 40 min    Activity Tolerance Patient tolerated treatment well    Behavior During Therapy WFL for tasks assessed/performed                  Past Medical History:  Diagnosis Date   Arthritis    Asthma    x 1 age 54   Hyperlipidemia    Right rotator cuff tear    Past Surgical History:  Procedure Laterality Date   COLONOSCOPY     JOINT REPLACEMENT     KNEE ARTHROSCOPY WITH MENISCAL REPAIR Left 10/04/2022   Procedure: LEFT KNEE ARTHROSCOPY WITH MEDIAL AND LATERAL MENISCAL REPAIR;  Surgeon: Huel Cote, MD;  Location: [facility];  Service: Orthopedics;  Laterality: Left;   SHOULDER ARTHROSCOPY WITH ROTATOR CUFF REPAIR AND OPEN BICEPS TENODESIS Right 07/13/2021   Procedure: RIGHT SHOULDER ARTHROSCOPY WITH ROTATOR CUFF REPAIR AND BICEPS TENODESIS;  Surgeon: Huel Cote, MD;  Location: [facility];  Service: Orthopedics;  Laterality: Right;   TOTAL HIP ARTHROPLASTY     bilatera   Patient Active Problem List   Diagnosis Date Noted   Acute lateral meniscus tear of left knee 10/04/2022   Acute medial meniscus tear of left knee 10/04/2022   Traumatic complete tear of right rotator cuff    Former smoker 12/14/2019   Acquired hallux rigidus of right foot 11/29/2018   Dyslipidemia 03/15/2018   Hyperglycemia 03/15/2018     REFERRING PROVIDER: Huel Cote, MD  REFERRING DIAG: [phone] (ICD-10-CM) - Chronic pain of left knee   S/p L knee medial and lateral meniscal repair  THERAPY DIAG:  Muscle weakness (generalized)  Stiffness of left knee, not elsewhere  classified  Difficulty in walking, not elsewhere classified  Left knee pain, unspecified chronicity  Rationale for Evaluation and Treatment: Rehabilitation  ONSET DATE: DOS 10/04/2022  SUBJECTIVE:   SUBJECTIVE STATEMENT: Pt is 4 weeks and 2 days s/p L knee medial meniscal root repair and lateral meniscal repair.  Pt is doing well.  He's noticed some intermittent medial knee pain.  He states it can be 1/10 and goes away quickly.  Pt reports he is having some tightness in L knee.  Pt denies any adverse effects after prior Rx and does report having some tightness.  Pt reports compliance with HEP.      PERTINENT HISTORY: L knee medial meniscal root repair and lateral meniscal repair on 10/04/22.  WBAT   R shoulder arthroscopic rotator cuff repair of supraspinatus, infraspinatus, and subscapularis, biceps tenodesis, and subacromial decompression on 07/13/2021.  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  Pt has a hx of bilat THA   WEIGHT BEARING RESTRICTIONS: Yes WBAT  FALLS:  Has patient fallen in last 6 months? No  LIVING ENVIRONMENT: Lives with: lives with their spouse Lives in: 1 story home Stairs: 2 steps without rail to enter home Has following equipment at home: bilat crutches, FWW  OCCUPATION:  Pt is retired.  PLOF: Independent.  Pt was ambulating without an AD.  Pt was able to perform his daily activities and functional mobility skills independently.   PATIENT GOALS: return to PLOF  NEXT MD VISIT: 10/20/2022  OBJECTIVE:   DIAGNOSTIC FINDINGS: Pt is post op.  He had x rays and MRI prior to surgery.  This did show tricompartmental degenerative changes.   TODAY'S TREATMENT:     LOWER EXTREMITY ROM:   ROM Right eval Left eval Left 10/2 Left 10/9 Right 10/16 Left 10/16 Left 10/23  Hip flexion         Hip extension         Hip abduction         Hip adduction          Hip internal rotation         Hip external rotation         Knee flexion 124 PROM 50  AAROM:  85-90 deg   AAROM:  116 deg  Knee extension 0 AROM lacking 7 deg  AROM lacking 4 deg AROM lacking 4 deg Lacking 1 deg Lacking 5 deg AROM:Lacking 1 deg  Ankle dorsiflexion         Ankle plantarflexion         Ankle inversion         Ankle eversion          (Blank rows = not tested)                Ther Ex:   Pt performed:  Supine SLR 3x10  Supine heel slides with strap x10 w/n protocol range Supine heel slides x 10 w/n pt and tissue tolerance  S/L Hip abd with pillow b/w knees 3x10 Longsitting gastroc stretch with strap 3x30 sec Heel raises 2x10, 1x15 with UE support TKE with YTB 2x10  Pt received sup and inferior patellar mobs f/b L knee flexion and extension PROM per pt and tissue tolerance w/n protocol ranges.    PATIENT EDUCATION:  Education details:  appropriate ROM per protocol, post op and surgical restrictions and limitations, objective findings, dx, HEP, POC, relevant anatomy, and gait.  Pt answered pt's questions.   Person educated: Patient Education method: Explanation, Demonstration, Tactile cues, Verbal cues, and Handouts Education comprehension: verbalized understanding, returned demonstration, verbal cues required, tactile cues required, and needs further education  HOME EXERCISE PROGRAM: Access Code: [email or web address] URL: [email or web address] Date: 10/08/2022 Prepared by: Aaron Edelman  Exercises - Supine Quadricep Sets  - 2 x daily - 7 x weekly - 2 sets - 10 reps - 5 seconds hold - ANKLE PUMPS  - 7 x weekly - Supine Active Straight Leg Raise  - 1 x daily - 6-7 x weekly - 2 sets - 10 reps - Supine Knee Extension Stretch on Towel Roll  - 2 x daily - 7 x weekly - 1 reps - 2 minutes hold - Supine Heel Slide with Strap w/n protocol range - 2 x daily - 7 x weekly - 2 sets - 10 reps - Sidelying Hip Abduction  - 1 x daily - 7 x weekly - 2 sets - 10 reps - Long  Sitting Calf Stretch with Strap  - 2 x daily - 7 x weekly - 2-3 reps - 20-30 second hold  ASSESSMENT:  CLINICAL IMPRESSION: Pt performed exercises per protocol well without pain.  PT progressed exercises per protocol and pt tolerated progression well without any c/o's.  Pt tolerated knee PROM well.  He demonstrates improved knee extension ROM and has excellent knee flexion ROM at this time in protocol.  He responded well to Rx having no pain after Rx.  He should benefit from continued skilled PT services per protocol to address impairments and goals and to assist in returning to desired level of function.         OBJECTIVE IMPAIRMENTS: Abnormal gait, decreased activity tolerance, decreased endurance, decreased mobility, difficulty walking, decreased ROM, decreased strength, hypomobility, increased edema, impaired flexibility, and pain.   ACTIVITY LIMITATIONS: lifting, bending, standing, squatting, stairs, transfers, and locomotion level  PARTICIPATION LIMITATIONS: meal prep, cleaning, driving, shopping, community activity, and yard work  PERSONAL FACTORS: 1 comorbidity: bilat THA  are also affecting patient's functional outcome.   REHAB POTENTIAL: Good  CLINICAL DECISION MAKING: Stable/uncomplicated  EVALUATION COMPLEXITY: Low   GOALS:   SHORT TERM GOALS:   Pt will be independent and compliant with HEP for improved strength, ROM, pain, and function.  Baseline: Goal status: INITIAL Target date:  11/05/2022   2.  Pt will demo L knee PROM 0 - 90 deg for improved stiffness and mobility.  Baseline:  Goal status: INITIAL Target date:  11/12/2022   3.  Pt will demo a good quad set and be able to perform supine SLR independently with no > than minimal extensor lag for improved quad strength and to assist with preparing for gait Baseline:  Goal status: INITIAL Target date:  11/05/2022  4.  Pt will progress with exercises per protocol without adverse effects for improved strength  and mobility.  Baseline:  Goal status: INITIAL Target date:  11/19/2022   5.  Pt will demo L knee AROM to 0 to 115-120 deg for improved mobility.   Baseline:  Goal status: INITIAL Target date:  12/31/2022  6.  Pt will be able to perform a 6 inch step up with good form and control.  Baseline:  Goal status: INITIAL Target date:  12/17/2022  7.  Pt will ambulate with a normalized heel to toe gait pattern without AD.   Baseline:  Goals status:  INITIAL  Target date:  12/17/2022    LONG TERM GOALS: Target date: 01/27/2022   Pt will be able to perform his ADLs/IADLs without significant pain and difficulty.  Baseline:  Goal status: INITIAL  2.  Pt will be able to perform stairs with a reciprocal gait with the rail. Baseline:  Goal status: INITIAL  3.  Pt will ambulate extended community distance without difficulty and significant pain.  Baseline:  Goal status: INITIAL  4.  Pt will demo 5/5 strength in L hip flex and at least 4+/5 strength in L knee flex and extension for improved performance of and tolerance with functional mobility  Baseline:  Goal status: INITIAL     PLAN:  PT FREQUENCY:  1x/wk x 4 weeks, 1-2x/wk x 2 weeks, and 2x/wk afterwards  PT DURATION: other: 16 weeks  PLANNED INTERVENTIONS: Therapeutic exercises, Therapeutic activity, Neuromuscular re-education, Balance training, Gait training, Patient/Family education, Self Care, Joint mobilization, Stair training, DME instructions, Aquatic Therapy, Dry Needling, Electrical stimulation, Cryotherapy, Moist heat, scar mobilization, Taping, Ultrasound, Manual therapy, and Re-evaluation  PLAN FOR NEXT SESSION: Cont per Dr. Serena Croissant meniscus repair protocol.  Ice as needed.        Audie Clear III PT, DPT 11/04/22 9:02 PM

## 2022-11-04 ENCOUNTER — Encounter (HOSPITAL_BASED_OUTPATIENT_CLINIC_OR_DEPARTMENT_OTHER): Payer: Self-pay | Admitting: Physical Therapy

## 2022-11-10 NOTE — Therapy (Signed)
OUTPATIENT PHYSICAL THERAPY TREATMENT NOTE   Patient Name: Chad Manning MRN: [id number] DOB:21-Dec-1955, 67 y.o., male Today's Date: 11/11/2022  END OF SESSION:  PT End of Session - 11/11/22 0944     Visit Number 6    Number of Visits 28    Date for PT Re-Evaluation 12/31/22    Authorization Type [employer]    PT Start Time [phone]    PT Stop Time 1023    PT Time Calculation (min) 39 min    Activity Tolerance Patient tolerated treatment well    Behavior During Therapy WFL for tasks assessed/performed                   Past Medical History:  Diagnosis Date   Arthritis    Asthma    x 1 age 72   Hyperlipidemia    Right rotator cuff tear    Past Surgical History:  Procedure Laterality Date   COLONOSCOPY     JOINT REPLACEMENT     KNEE ARTHROSCOPY WITH MENISCAL REPAIR Left 10/04/2022   Procedure: LEFT KNEE ARTHROSCOPY WITH MEDIAL AND LATERAL MENISCAL REPAIR;  Surgeon: Huel Cote, MD;  Location: [facility];  Service: Orthopedics;  Laterality: Left;   SHOULDER ARTHROSCOPY WITH ROTATOR CUFF REPAIR AND OPEN BICEPS TENODESIS Right 07/13/2021   Procedure: RIGHT SHOULDER ARTHROSCOPY WITH ROTATOR CUFF REPAIR AND BICEPS TENODESIS;  Surgeon: Huel Cote, MD;  Location: [facility];  Service: Orthopedics;  Laterality: Right;   TOTAL HIP ARTHROPLASTY     bilatera   Patient Active Problem List   Diagnosis Date Noted   Acute lateral meniscus tear of left knee 10/04/2022   Acute medial meniscus tear of left knee 10/04/2022   Traumatic complete tear of right rotator cuff    Former smoker 12/14/2019   Acquired hallux rigidus of right foot 11/29/2018   Dyslipidemia 03/15/2018   Hyperglycemia 03/15/2018     REFERRING PROVIDER: Huel Cote, MD  REFERRING DIAG: [phone] (ICD-10-CM) - Chronic pain of left knee   S/p L knee medial and lateral meniscal repair  THERAPY DIAG:  Muscle weakness (generalized)  Stiffness of left knee, not elsewhere  classified  Difficulty in walking, not elsewhere classified  Left knee pain, unspecified chronicity  Rationale for Evaluation and Treatment: Rehabilitation  ONSET DATE: DOS 10/04/2022  SUBJECTIVE:   SUBJECTIVE STATEMENT: Pt is 5 weeks and 3 days s/p L knee medial meniscal root repair and lateral meniscal repair.  Pt is doing well.  Pt doesn't remember any increased pain after prior Rx.  Pt states his knee does feel tight at times though hasn't noticed the intermittent medial knee pain recently.  Pt reports compliance with HEP.      PERTINENT HISTORY: L knee medial meniscal root repair and lateral meniscal repair on 10/04/22.  WBAT   R shoulder arthroscopic rotator cuff repair of supraspinatus, infraspinatus, and subscapularis, biceps tenodesis, and subacromial decompression on 07/13/2021.  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  Pt has a hx of bilat THA   WEIGHT BEARING RESTRICTIONS: Yes WBAT  FALLS:  Has patient fallen in last 6 months? No  LIVING ENVIRONMENT: Lives with: lives with their spouse Lives in: 1 story home Stairs: 2 steps without rail to enter home Has following equipment at home: bilat crutches, FWW  OCCUPATION: Pt is retired.  PLOF: Independent.  Pt was ambulating without an AD.  Pt  was able to perform his daily activities and functional mobility skills independently.   PATIENT GOALS: return to PLOF  NEXT MD VISIT: 10/20/2022  OBJECTIVE:   DIAGNOSTIC FINDINGS: Pt is post op.  He had x rays and MRI prior to surgery.  This did show tricompartmental degenerative changes.   TODAY'S TREATMENT:     LOWER EXTREMITY ROM:   ROM Right eval Left eval Left 10/2 Left 10/9 Right 10/16 Left 10/16 Left 10/23 Left 10/31  Hip flexion          Hip extension          Hip abduction          Hip adduction          Hip internal rotation          Hip external  rotation          Knee flexion 124 PROM 50  AAROM:  85-90 deg   AAROM:  116 deg   Knee extension 0 AROM lacking 7 deg  AROM lacking 4 deg AROM lacking 4 deg Lacking 1 deg Lacking 5 deg AROM:Lacking 1 deg 0 deg   Ankle dorsiflexion          Ankle plantarflexion          Ankle inversion          Ankle eversion           (Blank rows = not tested)  OBSERVATION: Pt has a knot under distal incision.  Incision is closed and has no redness.  Pt has no tenderness with palpation.  Pt states his wife asked MD about when he saw him.                 Ther Ex:   Pt performed:  Supine SLR 3x10  Supine heel slides 2 x 10 w/n pt and tissue tolerance  Prone hip extension 2x10 S/L Hip abd with pillow b/w knees 3x10 Heel raises 3x10 with UE support TKE with RTB x 10, GTB 2x10  Pt received L knee flexion and extension PROM in supine per pt and tissue tolerance w/n protocol ranges.   PT updated HEP and gave pt a HEP handout. Pt educated in correct form and appropriate frequency of exercises.     PATIENT EDUCATION:  Education details:  post op and surgical restrictions and limitations, exercise form, ROM findings, dx, HEP, POC, and relevant anatomy.    Person educated: Patient Education method: Explanation, Demonstration, Tactile cues, Verbal cues, and Handouts Education comprehension: verbalized understanding, returned demonstration, verbal cues required, tactile cues required, and needs further education  HOME EXERCISE PROGRAM: Access Code: [email or web address] URL: [email or web address] Date: 10/08/2022 Prepared by: Aaron Edelman  Exercises - Supine Quadricep Sets  - 2 x daily - 7 x weekly - 2 sets - 10 reps - 5 seconds hold - ANKLE PUMPS  - 7 x weekly - Supine Active Straight Leg Raise  - 1 x daily - 6-7 x weekly - 2 sets - 10 reps - Supine Knee Extension Stretch on Towel Roll  - 2 x daily - 7 x weekly - 1 reps - 2 minutes hold - Supine Heel Slide with Strap w/n protocol range - 2 x daily -  7 x weekly - 2 sets - 10 reps - Sidelying Hip Abduction  - 1 x daily - 7 x weekly - 2 sets - 10 reps - Long Sitting Calf Stretch with Strap  - 2 x daily - 7 x weekly -  2-3 reps - 20-30 second hold  Updated HEP: - Heel Raises with Counter Support  - 1 x daily - 6-7 x weekly - 2 sets - 10 reps - Standing Terminal Knee Extension with Resistance  - 1 x daily - 5 x weekly - 2-3 sets - 10 reps  ASSESSMENT:  CLINICAL IMPRESSION: Pt is making excellent progress in all areas.  Pt demonstrates improved L knee extension AROM to 0 deg today.  He has good knee flexion ROM.  Pt performed exercises per protocol well without pain.  PT updated HEP and gave pt a HEP handout.  Pt demonstrates good understanding of HEP.  He responded well to Rx having no pain after Rx.  He should benefit from continued skilled PT services per protocol to address impairments and goals and to assist in returning to desired level of function.       OBJECTIVE IMPAIRMENTS: Abnormal gait, decreased activity tolerance, decreased endurance, decreased mobility, difficulty walking, decreased ROM, decreased strength, hypomobility, increased edema, impaired flexibility, and pain.   ACTIVITY LIMITATIONS: lifting, bending, standing, squatting, stairs, transfers, and locomotion level  PARTICIPATION LIMITATIONS: meal prep, cleaning, driving, shopping, community activity, and yard work  PERSONAL FACTORS: 1 comorbidity: bilat THA  are also affecting patient's functional outcome.   REHAB POTENTIAL: Good  CLINICAL DECISION MAKING: Stable/uncomplicated  EVALUATION COMPLEXITY: Low   GOALS:   SHORT TERM GOALS:   Pt will be independent and compliant with HEP for improved strength, ROM, pain, and function.  Baseline: Goal status: INITIAL Target date:  11/05/2022   2.  Pt will demo L knee PROM 0 - 90 deg for improved stiffness and mobility.  Baseline:  Goal status: INITIAL Target date:  11/12/2022   3.  Pt will demo a good quad set  and be able to perform supine SLR independently with no > than minimal extensor lag for improved quad strength and to assist with preparing for gait Baseline:  Goal status: INITIAL Target date:  11/05/2022  4.  Pt will progress with exercises per protocol without adverse effects for improved strength and mobility.  Baseline:  Goal status: INITIAL Target date:  11/19/2022   5.  Pt will demo L knee AROM to 0 to 115-120 deg for improved mobility.   Baseline:  Goal status: INITIAL Target date:  12/31/2022  6.  Pt will be able to perform a 6 inch step up with good form and control.  Baseline:  Goal status: INITIAL Target date:  12/17/2022  7.  Pt will ambulate with a normalized heel to toe gait pattern without AD.   Baseline:  Goals status:  INITIAL  Target date:  12/17/2022    LONG TERM GOALS: Target date: 01/27/2022   Pt will be able to perform his ADLs/IADLs without significant pain and difficulty.  Baseline:  Goal status: INITIAL  2.  Pt will be able to perform stairs with a reciprocal gait with the rail. Baseline:  Goal status: INITIAL  3.  Pt will ambulate extended community distance without difficulty and significant pain.  Baseline:  Goal status: INITIAL  4.  Pt will demo 5/5 strength in L hip flex and at least 4+/5 strength in L knee flex and extension for improved performance of and tolerance with functional mobility  Baseline:  Goal status: INITIAL     PLAN:  PT FREQUENCY:  1x/wk x 4 weeks, 1-2x/wk x 2 weeks, and 2x/wk afterwards  PT DURATION: other: 16 weeks  PLANNED INTERVENTIONS: Therapeutic exercises, Therapeutic activity, Neuromuscular  re-education, Balance training, Gait training, Patient/Family education, Self Care, Joint mobilization, Stair training, DME instructions, Aquatic Therapy, Dry Needling, Electrical stimulation, Cryotherapy, Moist heat, scar mobilization, Taping, Ultrasound, Manual therapy, and Re-evaluation  PLAN FOR NEXT SESSION: Cont  per Dr. Serena Croissant meniscus repair protocol.  Ice as needed.        Audie Clear III PT, DPT 11/11/22 11:29 PM

## 2022-11-11 ENCOUNTER — Ambulatory Visit (HOSPITAL_BASED_OUTPATIENT_CLINIC_OR_DEPARTMENT_OTHER): Payer: Medicare PPO | Admitting: Physical Therapy

## 2022-11-11 ENCOUNTER — Encounter (HOSPITAL_BASED_OUTPATIENT_CLINIC_OR_DEPARTMENT_OTHER): Payer: Self-pay | Admitting: Physical Therapy

## 2022-11-11 DIAGNOSIS — R262 Difficulty in walking, not elsewhere classified: Secondary | ICD-10-CM | POA: Diagnosis not present

## 2022-11-11 DIAGNOSIS — M25662 Stiffness of left knee, not elsewhere classified: Secondary | ICD-10-CM

## 2022-11-11 DIAGNOSIS — M6281 Muscle weakness (generalized): Secondary | ICD-10-CM | POA: Diagnosis not present

## 2022-11-11 DIAGNOSIS — M25562 Pain in left knee: Secondary | ICD-10-CM

## 2022-11-12 ENCOUNTER — Encounter (HOSPITAL_BASED_OUTPATIENT_CLINIC_OR_DEPARTMENT_OTHER): Payer: Self-pay | Admitting: Orthopaedic Surgery

## 2022-11-15 ENCOUNTER — Encounter (HOSPITAL_BASED_OUTPATIENT_CLINIC_OR_DEPARTMENT_OTHER): Payer: Medicare PPO | Admitting: Physical Therapy

## 2022-11-17 ENCOUNTER — Encounter (HOSPITAL_BASED_OUTPATIENT_CLINIC_OR_DEPARTMENT_OTHER): Payer: Self-pay | Admitting: Orthopaedic Surgery

## 2022-11-17 ENCOUNTER — Ambulatory Visit (HOSPITAL_BASED_OUTPATIENT_CLINIC_OR_DEPARTMENT_OTHER): Payer: Medicare PPO | Admitting: Orthopaedic Surgery

## 2022-11-17 ENCOUNTER — Encounter: Payer: Self-pay | Admitting: Family Medicine

## 2022-11-17 DIAGNOSIS — S83282A Other tear of lateral meniscus, current injury, left knee, initial encounter: Secondary | ICD-10-CM

## 2022-11-17 NOTE — Progress Notes (Signed)
Post Operative Evaluation    Procedure/Date of Surgery: left knee medial/lateral meniscal repair 9/23  Interval History:   Presents 6 weeks status post above procedure.  Overall he is doing very well.  He has had a fluid collection around the tibial suture anchor.  This has been improving with a compressive sleeve.  Denies any redness or swelling around this area   PMH/PSH/Family History/Social History/Meds/Allergies:    Past Medical History:  Diagnosis Date   Arthritis    Asthma    x 1 age 67   Hyperlipidemia    Right rotator cuff tear    Past Surgical History:  Procedure Laterality Date   COLONOSCOPY     JOINT REPLACEMENT     KNEE ARTHROSCOPY WITH MENISCAL REPAIR Left 10/04/2022   Procedure: LEFT KNEE ARTHROSCOPY WITH MEDIAL AND LATERAL MENISCAL REPAIR;  Surgeon: Huel Cote, MD;  Location: [facility];  Service: Orthopedics;  Laterality: Left;   SHOULDER ARTHROSCOPY WITH ROTATOR CUFF REPAIR AND OPEN BICEPS TENODESIS Right 07/13/2021   Procedure: RIGHT SHOULDER ARTHROSCOPY WITH ROTATOR CUFF REPAIR AND BICEPS TENODESIS;  Surgeon: Huel Cote, MD;  Location: [facility];  Service: Orthopedics;  Laterality: Right;   TOTAL HIP ARTHROPLASTY     bilatera   Social History   Socioeconomic History   Marital status: Married    Spouse name: Not on file   Number of children: 2   Years of education: Not on file   Highest education level: Bachelor's degree (e.g., BA, AB, BS)  Occupational History   Not on file  Tobacco Use   Smoking status: Former    Current packs/day: 0.00    Average packs/day: 0.5 packs/day for 40.0 years (20.0 ttl pk-yrs)    Types: Cigarettes    Start date: 19    Quit date: 2010    Years since quitting: 14.8   Smokeless tobacco: Never  Vaping Use   Vaping status: Never Used  Substance and Sexual Activity   Alcohol use: Yes    Alcohol/week: 5.0 - 10.0 standard drinks of alcohol    Types: 5 - 10 Cans of  beer per week    Comment: Beer   Drug use: Never   Sexual activity: Yes  Other Topics Concern   Not on file  Social History Narrative   [employer]   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[employer]   Social Determinants of Health   Financial Resource Strain: Low Risk  (08/31/2022)   Overall Financial Resource Strain (CARDIA)    Difficulty of Paying Living Expenses: Not hard at all  Food Insecurity: No Food Insecurity (08/31/2022)   Hunger Vital Sign    Worried About Running Out of Food in the Last Year: Never true    Ran Out of Food in the Last Year: Never true  Transportation Needs: No Transportation Needs (08/31/2022)   PRAPARE - Administrator, Civil Service (Medical): No    Lack of Transportation (Non-Medical): No  Physical Activity: Insufficiently Active (08/31/2022)   Exercise Vital Sign    Days of Exercise per Week: 2 days    Minutes of Exercise per Session: 20 min  Stress: No Stress Concern Present (08/31/2022)   [employer] of Occupational Health - Occupational Stress Questionnaire    Feeling of Stress : Not at all  Social Connections: Moderately Integrated (  08/31/2022)   Social Connection and Isolation Panel [NHANES]    Frequency of Communication with Friends and Family: More than three times a week    Frequency of Social Gatherings with Friends and Family: Once a week    Attends Religious Services: 1 to 4 times per year    Active Member of [employer] or Organizations: No    Attends Engineer, structural: Never    Marital Status: Married   Family History  Problem Relation Age of Onset   Heart disease Father    Cancer Mother    Lung cancer Sister        smoker   Colon cancer Neg Hx    Prostate cancer Neg Hx    Esophageal cancer Neg Hx    Rectal cancer Neg Hx    No Known Allergies Current Outpatient Medications  Medication Sig Dispense Refill   aspirin EC 325 MG tablet Take 325 mg by mouth daily.     atorvastatin (LIPITOR) 40 MG tablet TAKE 1 TABLET BY MOUTH  DAILY 90 tablet 1   docusate sodium (COLACE) 100 MG capsule Take 200 mg by mouth daily.     ibuprofen (ADVIL) 800 MG tablet Take 1 tablet (800 mg total) by mouth every 8 (eight) hours as needed. 30 tablet 0   methocarbamol (ROBAXIN) 500 MG tablet Take 1 tablet (500 mg total) by mouth 4 (four) times daily. 30 tablet 3   No current facility-administered medications for this visit.   No results found.  Review of Systems:   A ROS was performed including pertinent positives and negatives as documented in the HPI.   Musculoskeletal Exam:    There were no vitals taken for this visit. Left knee incisions are well-appearing without erythema or drainage.  He has no joint line tenderness.  Range of motion is from 0 to 120 degrees.  There is a small seroma around the distal tibia incision.  There is no redness or swelling around this sensation is intact distally throughout  Right knee with tenderness about the medial joint line with positive McMurray.  There is an effusion about the right knee.  There is pain with weightbearing range of motion is from -3-120 Imaging:      I personally reviewed and interpreted the radiographs.   Assessment:   6 weeks status post left knee medial lateral meniscal repair overall doing very well.  He does have some evidence of a seroma around the tibial anchor.  I did aspirate this at today's visit with 2 cc of serous fluid which was not consistent with any type of infection.  I have continued to recommend a compressive sleeve over this.  I will plan to see him back in 8 weeks for reassessment.  He is also having right knee symptoms with medial meniscal pain.  At this time he has worked with physical therapy and is not getting any relief on the right side.  His symptoms are very similar to his left-sided symptoms.  Given this I will plan for an MRI on the right Plan :    -Return to clinic 8 weeks for reassessment      I personally saw and evaluated the patient,  and participated in the management and treatment plan.  Huel Cote, MD Attending Physician, Orthopedic Surgery  This document was dictated using Dragon voice recognition software. A reasonable attempt at proof reading has been made to minimize errors.

## 2022-11-18 NOTE — Telephone Encounter (Signed)
Ok to order labs.  Chad Manning. Jimmey Ralph, MD 11/18/2022 9:58 AM

## 2022-11-18 NOTE — Telephone Encounter (Signed)
Ok to placed order? 

## 2022-11-19 ENCOUNTER — Other Ambulatory Visit: Payer: Self-pay | Admitting: *Deleted

## 2022-11-19 ENCOUNTER — Ambulatory Visit
Admission: RE | Admit: 2022-11-19 | Discharge: 2022-11-19 | Disposition: A | Discharge status: Home / Self Care | Payer: Medicare PPO | Source: Ambulatory Visit | Attending: Orthopaedic Surgery | Admitting: Orthopaedic Surgery

## 2022-11-19 ENCOUNTER — Other Ambulatory Visit: Payer: Medicare PPO

## 2022-11-19 DIAGNOSIS — M1712 Unilateral primary osteoarthritis, left knee: Secondary | ICD-10-CM | POA: Diagnosis not present

## 2022-11-19 DIAGNOSIS — M23321 Other meniscus derangements, posterior horn of medial meniscus, right knee: Secondary | ICD-10-CM | POA: Diagnosis not present

## 2022-11-19 DIAGNOSIS — E611 Iron deficiency: Secondary | ICD-10-CM

## 2022-11-19 DIAGNOSIS — M23331 Other meniscus derangements, other medial meniscus, right knee: Secondary | ICD-10-CM | POA: Diagnosis not present

## 2022-11-19 DIAGNOSIS — S83282A Other tear of lateral meniscus, current injury, left knee, initial encounter: Secondary | ICD-10-CM

## 2022-11-19 DIAGNOSIS — M25561 Pain in right knee: Secondary | ICD-10-CM | POA: Diagnosis not present

## 2022-11-23 ENCOUNTER — Other Ambulatory Visit: Payer: Self-pay | Admitting: Family Medicine

## 2022-11-24 ENCOUNTER — Other Ambulatory Visit (HOSPITAL_COMMUNITY): Payer: Self-pay

## 2022-11-24 ENCOUNTER — Ambulatory Visit (HOSPITAL_BASED_OUTPATIENT_CLINIC_OR_DEPARTMENT_OTHER): Payer: Medicare PPO | Attending: Orthopaedic Surgery | Admitting: Physical Therapy

## 2022-11-24 DIAGNOSIS — M25662 Stiffness of left knee, not elsewhere classified: Secondary | ICD-10-CM | POA: Diagnosis not present

## 2022-11-24 DIAGNOSIS — M6281 Muscle weakness (generalized): Secondary | ICD-10-CM | POA: Insufficient documentation

## 2022-11-24 DIAGNOSIS — R262 Difficulty in walking, not elsewhere classified: Secondary | ICD-10-CM | POA: Diagnosis not present

## 2022-11-24 DIAGNOSIS — M25562 Pain in left knee: Secondary | ICD-10-CM | POA: Insufficient documentation

## 2022-11-24 MED ORDER — IBUPROFEN 800 MG PO TABS
800.0000 mg | ORAL_TABLET | Freq: Three times a day (TID) | ORAL | 0 refills | Status: DC | PRN
  Filled 2022-11-26: qty 30, 10d supply, fill #0

## 2022-11-24 NOTE — Therapy (Signed)
OUTPATIENT PHYSICAL THERAPY TREATMENT NOTE   Patient Name: Chad Manning MRN: [id number] DOB:Feb 07, 1955, 67 y.o., male Today's Date: 11/25/2022  END OF SESSION:  PT End of Session - 11/24/22 0902     Visit Number 7    Number of Visits 28    Date for PT Re-Evaluation 12/31/22    Authorization Type [employer]    PT Start Time 0858    PT Stop Time 0936    PT Time Calculation (min) 38 min    Activity Tolerance Patient tolerated treatment well    Behavior During Therapy WFL for tasks assessed/performed                   Past Medical History:  Diagnosis Date   Arthritis    Asthma    x 1 age 89   Hyperlipidemia    Right rotator cuff tear    Past Surgical History:  Procedure Laterality Date   COLONOSCOPY     JOINT REPLACEMENT     KNEE ARTHROSCOPY WITH MENISCAL REPAIR Left 10/04/2022   Procedure: LEFT KNEE ARTHROSCOPY WITH MEDIAL AND LATERAL MENISCAL REPAIR;  Surgeon: Huel Cote, MD;  Location: [facility];  Service: Orthopedics;  Laterality: Left;   SHOULDER ARTHROSCOPY WITH ROTATOR CUFF REPAIR AND OPEN BICEPS TENODESIS Right 07/13/2021   Procedure: RIGHT SHOULDER ARTHROSCOPY WITH ROTATOR CUFF REPAIR AND BICEPS TENODESIS;  Surgeon: Huel Cote, MD;  Location: [facility];  Service: Orthopedics;  Laterality: Right;   TOTAL HIP ARTHROPLASTY     bilatera   Patient Active Problem List   Diagnosis Date Noted   Acute lateral meniscus tear of left knee 10/04/2022   Acute medial meniscus tear of left knee 10/04/2022   Traumatic complete tear of right rotator cuff    Former smoker 12/14/2019   Acquired hallux rigidus of right foot 11/29/2018   Dyslipidemia 03/15/2018   Hyperglycemia 03/15/2018     REFERRING PROVIDER: Huel Cote, MD  REFERRING DIAG: [phone] (ICD-10-CM) - Chronic pain of left knee   S/p L knee medial and lateral meniscal repair  THERAPY DIAG:  Muscle weakness (generalized)  Stiffness of left knee, not elsewhere  classified  Difficulty in walking, not elsewhere classified  Left knee pain, unspecified chronicity  Rationale for Evaluation and Treatment: Rehabilitation  ONSET DATE: DOS 10/04/2022  SUBJECTIVE:   SUBJECTIVE STATEMENT: Pt is 7 weeks and 2 days s/p L knee medial meniscal root repair and lateral meniscal repair.  Pt saw MD about the knot on his tibial tuberosity.  Pt states MD informed him he had a serotoma and he drew some fluid off.  He has been wearing a compression sleeve.  Pt denies pain currently and gets a flash of pain occasionally.  Pt denies any adverse effects after prior Rx.    Pt reports compliance with HEP.  "I'm getting better."  Pt reports improved performance of stairs.  Pt states he's not sure how much more PT he will need.    PERTINENT HISTORY: L knee medial meniscal root repair and lateral meniscal repair on 10/04/22.  WBAT   R shoulder arthroscopic rotator cuff repair of supraspinatus, infraspinatus, and subscapularis, biceps tenodesis, and subacromial decompression on 07/13/2021.  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  Pt has a hx of bilat THA   WEIGHT BEARING RESTRICTIONS: Yes WBAT  FALLS:  Has patient fallen in last 6 months? No  LIVING ENVIRONMENT: Lives with: lives with their spouse Lives in: 1 story home Stairs: 2 steps without rail to enter home Has following equipment at home: bilat crutches, FWW  OCCUPATION: Pt is retired.  PLOF: Independent.  Pt was ambulating without an AD.  Pt was able to perform his daily activities and functional mobility skills independently.   PATIENT GOALS: return to PLOF  NEXT MD VISIT: 10/20/2022  OBJECTIVE:   DIAGNOSTIC FINDINGS: Pt is post op.  He had x rays and MRI prior to surgery.  This did show tricompartmental degenerative changes.   TODAY'S TREATMENT:     LOWER EXTREMITY ROM:   ROM Right eval Left eval  Left 10/2 Left 10/9 Right 10/16 Left 10/16 Left 10/23 Left 10/31 Left 11/13 AROM  Hip flexion           Hip extension           Hip abduction           Hip adduction           Hip internal rotation           Hip external rotation           Knee flexion 124 PROM 50  AAROM:  85-90 deg   AAROM:  116 deg  112 deg   Knee extension 0 AROM lacking 7 deg  AROM lacking 4 deg AROM lacking 4 deg Lacking 1 deg Lacking 5 deg AROM:Lacking 1 deg 0 deg  0 deg  Ankle dorsiflexion           Ankle plantarflexion           Ankle inversion           Ankle eversion            (Blank rows = not tested)  OBSERVATION: Pt has a knot under distal incision which appears to be decreased in size.  Incision has no redness.                  Ther Ex:   Reviewed current function, HEP compliance, pain level, and response to prior Rx.  Pt performed:  Supine SLR x10 with 0#, 2x10 with 1#  Supine heel slides 2 x 10 w/n pt and tissue tolerance  Prone hip extension 3x10 S/L Hip abd with pillow b/w knees 3x10 Heel raises 3x10 with UE support TKE with GTB 3x10  Pt received L knee flexion and extension PROM in supine per pt and tissue tolerance w/n protocol ranges.   PATIENT EDUCATION:  Education details:  post op and surgical restrictions and limitations, exercise form, protocol, rationale of interventions, ROM findings, dx, HEP, POC, and relevant anatomy.    Person educated: Patient Education method: Explanation, Demonstration, Tactile cues, Verbal cues, and Handouts Education comprehension: verbalized understanding, returned demonstration, verbal cues required, tactile cues required, and needs further education  HOME EXERCISE PROGRAM: Access Code: [email or web address] URL: [email or web address] Date: 10/08/2022 Prepared by: Aaron Edelman  Exercises - Supine Quadricep Sets  - 2 x daily - 7 x weekly - 2 sets - 10 reps - 5 seconds hold - ANKLE PUMPS  - 7 x weekly - Supine Active Straight Leg Raise  - 1  x daily - 6-7 x weekly - 2 sets - 10 reps - Supine Knee Extension Stretch on Towel Roll  - 2 x daily - 7 x weekly - 1 reps - 2 minutes hold - Supine Heel Slide with Strap w/n protocol range - 2 x daily -  7 x weekly - 2 sets - 10 reps - Sidelying Hip Abduction  - 1 x daily - 7 x weekly - 2 sets - 10 reps - Long Sitting Calf Stretch with Strap  - 2 x daily - 7 x weekly - 2-3 reps - 20-30 second hold - Heel Raises with Counter Support  - 1 x daily - 6-7 x weekly - 2 sets - 10 reps - Standing Terminal Knee Extension with Resistance  - 1 x daily - 5 x weekly - 2-3 sets - 10 reps  ASSESSMENT:  CLINICAL IMPRESSION: Pt is making excellent progress in all areas.  Pt did see MD about the knot under distal incision and had it aspirated.  The knot has improved and it appears to be decreased in size.  Pt performed exercises per protocol well without c/o's.  He tolerated L knee PROM well and is progressing well with knee ROM.  He responded well to Rx having no pain and no c/o's after Rx.  He should benefit from continued skilled PT services per protocol to address impairments and goals and to assist in returning to desired level of function.       OBJECTIVE IMPAIRMENTS: Abnormal gait, decreased activity tolerance, decreased endurance, decreased mobility, difficulty walking, decreased ROM, decreased strength, hypomobility, increased edema, impaired flexibility, and pain.   ACTIVITY LIMITATIONS: lifting, bending, standing, squatting, stairs, transfers, and locomotion level  PARTICIPATION LIMITATIONS: meal prep, cleaning, driving, shopping, community activity, and yard work  PERSONAL FACTORS: 1 comorbidity: bilat THA  are also affecting patient's functional outcome.   REHAB POTENTIAL: Good  CLINICAL DECISION MAKING: Stable/uncomplicated  EVALUATION COMPLEXITY: Low   GOALS:   SHORT TERM GOALS:   Pt will be independent and compliant with HEP for improved strength, ROM, pain, and function.   Baseline: Goal status: INITIAL Target date:  11/05/2022   2.  Pt will demo L knee PROM 0 - 90 deg for improved stiffness and mobility.  Baseline:  Goal status: INITIAL Target date:  11/12/2022   3.  Pt will demo a good quad set and be able to perform supine SLR independently with no > than minimal extensor lag for improved quad strength and to assist with preparing for gait Baseline:  Goal status: INITIAL Target date:  11/05/2022  4.  Pt will progress with exercises per protocol without adverse effects for improved strength and mobility.  Baseline:  Goal status: INITIAL Target date:  11/19/2022   5.  Pt will demo L knee AROM to 0 to 115-120 deg for improved mobility.   Baseline:  Goal status: INITIAL Target date:  12/31/2022  6.  Pt will be able to perform a 6 inch step up with good form and control.  Baseline:  Goal status: INITIAL Target date:  12/17/2022  7.  Pt will ambulate with a normalized heel to toe gait pattern without AD.   Baseline:  Goals status:  INITIAL  Target date:  12/17/2022    LONG TERM GOALS: Target date: 01/27/2022   Pt will be able to perform his ADLs/IADLs without significant pain and difficulty.  Baseline:  Goal status: INITIAL  2.  Pt will be able to perform stairs with a reciprocal gait with the rail. Baseline:  Goal status: INITIAL  3.  Pt will ambulate extended community distance without difficulty and significant pain.  Baseline:  Goal status: INITIAL  4.  Pt will demo 5/5 strength in L hip flex and at least 4+/5 strength in L knee flex  and extension for improved performance of and tolerance with functional mobility  Baseline:  Goal status: INITIAL     PLAN:  PT FREQUENCY:  1x/wk x 4 weeks, 1-2x/wk x 2 weeks, and 2x/wk afterwards  PT DURATION: other: 16 weeks  PLANNED INTERVENTIONS: Therapeutic exercises, Therapeutic activity, Neuromuscular re-education, Balance training, Gait training, Patient/Family education, Self Care,  Joint mobilization, Stair training, DME instructions, Aquatic Therapy, Dry Needling, Electrical stimulation, Cryotherapy, Moist heat, scar mobilization, Taping, Ultrasound, Manual therapy, and Re-evaluation  PLAN FOR NEXT SESSION: Cont per Dr. Serena Croissant meniscus repair protocol.  Ice as needed.        Audie Clear III PT, DPT 11/25/22 3:21 PM

## 2022-11-25 ENCOUNTER — Encounter (HOSPITAL_BASED_OUTPATIENT_CLINIC_OR_DEPARTMENT_OTHER): Payer: Self-pay | Admitting: Physical Therapy

## 2022-11-26 ENCOUNTER — Other Ambulatory Visit (HOSPITAL_COMMUNITY): Payer: Self-pay

## 2022-11-30 ENCOUNTER — Other Ambulatory Visit: Payer: Medicare PPO

## 2022-12-01 ENCOUNTER — Other Ambulatory Visit (INDEPENDENT_AMBULATORY_CARE_PROVIDER_SITE_OTHER): Payer: Medicare PPO

## 2022-12-01 ENCOUNTER — Ambulatory Visit (HOSPITAL_BASED_OUTPATIENT_CLINIC_OR_DEPARTMENT_OTHER): Payer: Medicare PPO | Admitting: Physical Therapy

## 2022-12-01 DIAGNOSIS — E611 Iron deficiency: Secondary | ICD-10-CM | POA: Diagnosis not present

## 2022-12-01 DIAGNOSIS — M6281 Muscle weakness (generalized): Secondary | ICD-10-CM | POA: Diagnosis not present

## 2022-12-01 DIAGNOSIS — M25662 Stiffness of left knee, not elsewhere classified: Secondary | ICD-10-CM

## 2022-12-01 DIAGNOSIS — R262 Difficulty in walking, not elsewhere classified: Secondary | ICD-10-CM

## 2022-12-01 DIAGNOSIS — M25562 Pain in left knee: Secondary | ICD-10-CM | POA: Diagnosis not present

## 2022-12-01 LAB — CBC
HCT: 41.5 % (ref 39.0–52.0)
Hemoglobin: 13.6 g/dL (ref 13.0–17.0)
MCHC: 32.8 g/dL (ref 30.0–36.0)
MCV: 89.7 fL (ref 78.0–100.0)
Platelets: 217 10*3/uL (ref 150.0–400.0)
RBC: 4.63 Mil/uL (ref 4.22–5.81)
RDW: 15.9 % — ABNORMAL HIGH (ref 11.5–15.5)
WBC: 7.8 10*3/uL (ref 4.0–10.5)

## 2022-12-01 NOTE — Therapy (Signed)
OUTPATIENT PHYSICAL THERAPY TREATMENT NOTE   Patient Name: Chad Manning MRN: [id number] DOB:1955-11-11, 67 y.o., male Today's Date: 12/02/2022  END OF SESSION:  PT End of Session - 12/02/22 0908     Visit Number 8    Number of Visits 28    Date for PT Re-Evaluation 12/31/22    Authorization Type [employer]    PT Start Time 0850    PT Stop Time 0936    PT Time Calculation (min) 46 min    Activity Tolerance Patient tolerated treatment well    Behavior During Therapy WFL for tasks assessed/performed                    Past Medical History:  Diagnosis Date   Arthritis    Asthma    x 1 age 38   Hyperlipidemia    Right rotator cuff tear    Past Surgical History:  Procedure Laterality Date   COLONOSCOPY     JOINT REPLACEMENT     KNEE ARTHROSCOPY WITH MENISCAL REPAIR Left 10/04/2022   Procedure: LEFT KNEE ARTHROSCOPY WITH MEDIAL AND LATERAL MENISCAL REPAIR;  Surgeon: Huel Cote, MD;  Location: [facility];  Service: Orthopedics;  Laterality: Left;   SHOULDER ARTHROSCOPY WITH ROTATOR CUFF REPAIR AND OPEN BICEPS TENODESIS Right 07/13/2021   Procedure: RIGHT SHOULDER ARTHROSCOPY WITH ROTATOR CUFF REPAIR AND BICEPS TENODESIS;  Surgeon: Huel Cote, MD;  Location: [facility];  Service: Orthopedics;  Laterality: Right;   TOTAL HIP ARTHROPLASTY     bilatera   Patient Active Problem List   Diagnosis Date Noted   Acute lateral meniscus tear of left knee 10/04/2022   Acute medial meniscus tear of left knee 10/04/2022   Traumatic complete tear of right rotator cuff    Former smoker 12/14/2019   Acquired hallux rigidus of right foot 11/29/2018   Dyslipidemia 03/15/2018   Hyperglycemia 03/15/2018     REFERRING PROVIDER: Huel Cote, MD  REFERRING DIAG: [phone] (ICD-10-CM) - Chronic pain of left knee   S/p L knee medial and lateral meniscal repair  THERAPY DIAG:  Muscle weakness (generalized)  Stiffness of left knee, not elsewhere  classified  Difficulty in walking, not elsewhere classified  Left knee pain, unspecified chronicity  Rationale for Evaluation and Treatment: Rehabilitation  ONSET DATE: DOS 10/04/2022  SUBJECTIVE:   SUBJECTIVE STATEMENT: Pt is 8 weeks and 2 days s/p L knee medial meniscal root repair and lateral meniscal repair.  Pt denies any adverse effects after prior Rx, maybe some soreness knee the following day.  Pt states MD informed him, he could have finished PT last visit.  Pt is using a sleeve on L knee which helps reduce the size of the knot on tibial tuberosity.     PERTINENT HISTORY: L knee medial meniscal root repair and lateral meniscal repair on 10/04/22.  WBAT   R shoulder arthroscopic rotator cuff repair of supraspinatus, infraspinatus, and subscapularis, biceps tenodesis, and subacromial decompression on 07/13/2021.  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  Pt has a hx of bilat THA   WEIGHT BEARING RESTRICTIONS: Yes WBAT  FALLS:  Has patient fallen in last 6 months? No  LIVING ENVIRONMENT: Lives with: lives with their spouse Lives in: 1 story home Stairs: 2 steps without rail to enter home Has following equipment at home: bilat crutches, FWW  OCCUPATION: Pt is retired.  PLOF: Independent.  Pt was ambulating without an AD.  Pt was able to perform his daily activities and functional mobility skills independently.   PATIENT GOALS: return to PLOF  NEXT MD VISIT: 10/20/2022  OBJECTIVE:   DIAGNOSTIC FINDINGS: Pt is post op.  He had x rays and MRI prior to surgery.  This did show tricompartmental degenerative changes.   TODAY'S TREATMENT:      OBSERVATION: Pt continues to have a knot under distal incision.   Gait:  Pt has a limp with gait though has improved with quality of gait.                 Ther Ex:   Reviewed current function, HEP compliance, pain level, and  response to prior Rx.  Pt performed:  Scifit bike x 7 mins Heel raises 3x10 with UE support TKE with GTB 3x10 Mini squats 2x10 Cybex leg press 40# x10, 50# 2x10 bilat Standing Hip abd L LE only 2x10 Prone knee flexion AROM 2x10 Supine SLR 2x10 with 1#    Pt received L knee flexion and extension PROM in supine per pt and tissue tolerance w/n protocol ranges.    PATIENT EDUCATION:  Education details:  post op and surgical restrictions and limitations, exercise form, protocol, rationale of interventions, dx, HEP, POC, and relevant anatomy.    Person educated: Patient Education method: Explanation, Demonstration, Tactile cues, Verbal cues Education comprehension: verbalized understanding, returned demonstration, verbal cues required, tactile cues required, and needs further education  HOME EXERCISE PROGRAM: Access Code: [email or web address] URL: [email or web address] Date: 10/08/2022 Prepared by: Aaron Edelman  Exercises - Supine Quadricep Sets  - 2 x daily - 7 x weekly - 2 sets - 10 reps - 5 seconds hold - ANKLE PUMPS  - 7 x weekly - Supine Active Straight Leg Raise  - 1 x daily - 6-7 x weekly - 2 sets - 10 reps - Supine Knee Extension Stretch on Towel Roll  - 2 x daily - 7 x weekly - 1 reps - 2 minutes hold - Supine Heel Slide with Strap w/n protocol range - 2 x daily - 7 x weekly - 2 sets - 10 reps - Sidelying Hip Abduction  - 1 x daily - 7 x weekly - 2 sets - 10 reps - Long Sitting Calf Stretch with Strap  - 2 x daily - 7 x weekly - 2-3 reps - 20-30 second hold - Heel Raises with Counter Support  - 1 x daily - 6-7 x weekly - 2 sets - 10 reps - Standing Terminal Knee Extension with Resistance  - 1 x daily - 5 x weekly - 2-3 sets - 10 reps  ASSESSMENT:  CLINICAL IMPRESSION: Pt is making excellent progress in all areas.  Pt is improving with gait though continues to have a limp.  He states he has a limp due to his prior hip surgeries, though PT observes a minimally increased  limp since L knee surgery.  PT progressed exercises per protocol today and pt tolerated progression well.  He performed exercises per protocol well with cuing and instruction in correct form.  He had no increased pain with exercises.  Pt tolerated knee PROM well.  He responded well to Rx having no pain and no c/o's after Rx.  He should benefit from continued skilled PT services per protocol to address impairments and goals and to assist in returning to desired level of function.        OBJECTIVE IMPAIRMENTS: Abnormal gait, decreased activity tolerance, decreased  endurance, decreased mobility, difficulty walking, decreased ROM, decreased strength, hypomobility, increased edema, impaired flexibility, and pain.   ACTIVITY LIMITATIONS: lifting, bending, standing, squatting, stairs, transfers, and locomotion level  PARTICIPATION LIMITATIONS: meal prep, cleaning, driving, shopping, community activity, and yard work  PERSONAL FACTORS: 1 comorbidity: bilat THA  are also affecting patient's functional outcome.   REHAB POTENTIAL: Good  CLINICAL DECISION MAKING: Stable/uncomplicated  EVALUATION COMPLEXITY: Low   GOALS:   SHORT TERM GOALS:   Pt will be independent and compliant with HEP for improved strength, ROM, pain, and function.  Baseline: Goal status: INITIAL Target date:  11/05/2022   2.  Pt will demo L knee PROM 0 - 90 deg for improved stiffness and mobility.  Baseline:  Goal status: INITIAL Target date:  11/12/2022   3.  Pt will demo a good quad set and be able to perform supine SLR independently with no > than minimal extensor lag for improved quad strength and to assist with preparing for gait Baseline:  Goal status: INITIAL Target date:  11/05/2022  4.  Pt will progress with exercises per protocol without adverse effects for improved strength and mobility.  Baseline:  Goal status: INITIAL Target date:  11/19/2022   5.  Pt will demo L knee AROM to 0 to 115-120 deg for  improved mobility.   Baseline:  Goal status: INITIAL Target date:  12/31/2022  6.  Pt will be able to perform a 6 inch step up with good form and control.  Baseline:  Goal status: INITIAL Target date:  12/17/2022  7.  Pt will ambulate with a normalized heel to toe gait pattern without AD.   Baseline:  Goals status:  INITIAL  Target date:  12/17/2022    LONG TERM GOALS: Target date: 01/27/2022   Pt will be able to perform his ADLs/IADLs without significant pain and difficulty.  Baseline:  Goal status: INITIAL  2.  Pt will be able to perform stairs with a reciprocal gait with the rail. Baseline:  Goal status: INITIAL  3.  Pt will ambulate extended community distance without difficulty and significant pain.  Baseline:  Goal status: INITIAL  4.  Pt will demo 5/5 strength in L hip flex and at least 4+/5 strength in L knee flex and extension for improved performance of and tolerance with functional mobility  Baseline:  Goal status: INITIAL     PLAN:  PT FREQUENCY:  1x/wk x 4 weeks, 1-2x/wk x 2 weeks, and 2x/wk afterwards  PT DURATION: other: 16 weeks  PLANNED INTERVENTIONS: Therapeutic exercises, Therapeutic activity, Neuromuscular re-education, Balance training, Gait training, Patient/Family education, Self Care, Joint mobilization, Stair training, DME instructions, Aquatic Therapy, Dry Needling, Electrical stimulation, Cryotherapy, Moist heat, scar mobilization, Taping, Ultrasound, Manual therapy, and Re-evaluation  PLAN FOR NEXT SESSION: Cont per Dr. Serena Croissant meniscus repair protocol.  Ice as needed.  PT will send MD a message concerning PT visits/POC.    Audie Clear III PT, DPT 12/02/22 9:23 AM

## 2022-12-02 ENCOUNTER — Encounter: Payer: Self-pay | Admitting: Family Medicine

## 2022-12-02 ENCOUNTER — Encounter (HOSPITAL_BASED_OUTPATIENT_CLINIC_OR_DEPARTMENT_OTHER): Payer: Self-pay | Admitting: Physical Therapy

## 2022-12-02 LAB — IRON,TIBC AND FERRITIN PANEL
%SAT: 28 % (ref 20–48)
Ferritin: 21 ng/mL — ABNORMAL LOW (ref 24–380)
Iron: 101 ug/dL (ref 50–180)
TIBC: 358 ug/dL (ref 250–425)

## 2022-12-03 NOTE — Telephone Encounter (Signed)
Please advise 

## 2022-12-03 NOTE — Telephone Encounter (Signed)
Can we have Chad Manning schedule an appointment to discuss?  The standard of care is to have a GI evaluation for iron deficiency found in men as it can be the first sign of serious gastrointestinal problems including colon cancer, stomach cancer, esophageal cancer, peptic ulcer disease, etc.  Katina Degree. Jimmey Ralph, MD 12/03/2022 12:19 PM

## 2022-12-15 ENCOUNTER — Ambulatory Visit (HOSPITAL_BASED_OUTPATIENT_CLINIC_OR_DEPARTMENT_OTHER): Payer: Medicare PPO | Admitting: Physical Therapy

## 2022-12-22 ENCOUNTER — Encounter (HOSPITAL_BASED_OUTPATIENT_CLINIC_OR_DEPARTMENT_OTHER): Payer: Medicare PPO | Admitting: Physical Therapy

## 2022-12-29 ENCOUNTER — Encounter (HOSPITAL_BASED_OUTPATIENT_CLINIC_OR_DEPARTMENT_OTHER): Payer: Medicare PPO | Admitting: Physical Therapy

## 2022-12-29 ENCOUNTER — Encounter: Payer: Self-pay | Admitting: Family Medicine

## 2022-12-30 ENCOUNTER — Other Ambulatory Visit: Payer: Self-pay | Admitting: *Deleted

## 2022-12-30 DIAGNOSIS — E611 Iron deficiency: Secondary | ICD-10-CM

## 2022-12-30 NOTE — Telephone Encounter (Signed)
Please advise 

## 2023-01-03 ENCOUNTER — Other Ambulatory Visit (INDEPENDENT_AMBULATORY_CARE_PROVIDER_SITE_OTHER): Payer: Medicare PPO

## 2023-01-03 DIAGNOSIS — E611 Iron deficiency: Secondary | ICD-10-CM

## 2023-01-03 LAB — CBC
HCT: 43.3 % (ref 39.0–52.0)
Hemoglobin: 14.5 g/dL (ref 13.0–17.0)
MCHC: 33.6 g/dL (ref 30.0–36.0)
MCV: 89.7 fL (ref 78.0–100.0)
Platelets: 231 10*3/uL (ref 150.0–400.0)
RBC: 4.82 Mil/uL (ref 4.22–5.81)
RDW: 14.8 % (ref 11.5–15.5)
WBC: 10.6 10*3/uL — ABNORMAL HIGH (ref 4.0–10.5)

## 2023-01-04 LAB — IRON,TIBC AND FERRITIN PANEL
%SAT: 27 % (ref 20–48)
Ferritin: 26 ng/mL (ref 24–380)
Iron: 96 ug/dL (ref 50–180)
TIBC: 353 ug/dL (ref 250–425)

## 2023-01-04 NOTE — Progress Notes (Signed)
His hemoglobin and iron levels are improving. HE can continue what he is doing in terms of diet and supplementation however I think it is still important for him to see gastroenterology to rule out internal bleeding as mentioned on previous notes.

## 2023-01-06 ENCOUNTER — Other Ambulatory Visit (HOSPITAL_COMMUNITY): Payer: Self-pay

## 2023-01-06 ENCOUNTER — Other Ambulatory Visit: Payer: Self-pay

## 2023-01-06 ENCOUNTER — Other Ambulatory Visit: Payer: Self-pay | Admitting: Family Medicine

## 2023-01-06 MED ORDER — IBUPROFEN 800 MG PO TABS
800.0000 mg | ORAL_TABLET | Freq: Three times a day (TID) | ORAL | 0 refills | Status: DC | PRN
  Filled 2023-01-06: qty 30, 10d supply, fill #0

## 2023-01-17 ENCOUNTER — Encounter: Payer: Medicare PPO | Admitting: Family Medicine

## 2023-01-17 ENCOUNTER — Ambulatory Visit (INDEPENDENT_AMBULATORY_CARE_PROVIDER_SITE_OTHER): Payer: Medicare PPO | Admitting: Orthopaedic Surgery

## 2023-01-17 DIAGNOSIS — S83282A Other tear of lateral meniscus, current injury, left knee, initial encounter: Secondary | ICD-10-CM

## 2023-01-17 NOTE — Progress Notes (Signed)
 Post Operative Evaluation    Procedure/Date of Surgery: left knee medial/lateral meniscal repair 9/23  Interval History:   Presents 12 weeks status post the above procedure.  He does overall have a mild limp.  He is continuing to improve.  Denies any pain in the knee.  She is some weakness   PMH/PSH/Family History/Social History/Meds/Allergies:    Past Medical History:  Diagnosis Date   Arthritis    Asthma    x 1 age 68   Hyperlipidemia    Right rotator cuff tear    Past Surgical History:  Procedure Laterality Date   COLONOSCOPY     JOINT REPLACEMENT     KNEE ARTHROSCOPY WITH MENISCAL REPAIR Left 10/04/2022   Procedure: LEFT KNEE ARTHROSCOPY WITH MEDIAL AND LATERAL MENISCAL REPAIR;  Surgeon: Genelle Standing, MD;  Location: [facility];  Service: Orthopedics;  Laterality: Left;   SHOULDER ARTHROSCOPY WITH ROTATOR CUFF REPAIR AND OPEN BICEPS TENODESIS Right 07/13/2021   Procedure: RIGHT SHOULDER ARTHROSCOPY WITH ROTATOR CUFF REPAIR AND BICEPS TENODESIS;  Surgeon: Genelle Standing, MD;  Location: [facility];  Service: Orthopedics;  Laterality: Right;   TOTAL HIP ARTHROPLASTY     bilatera   Social History   Socioeconomic History   Marital status: Married    Spouse name: Not on file   Number of children: 2   Years of education: Not on file   Highest education level: Bachelor's degree (e.g., BA, AB, BS)  Occupational History   Not on file  Tobacco Use   Smoking status: Former    Current packs/day: 0.00    Average packs/day: 0.5 packs/day for 40.0 years (20.0 ttl pk-yrs)    Types: Cigarettes    Start date: 56    Quit date: 2010    Years since quitting: 15.0   Smokeless tobacco: Never  Vaping Use   Vaping status: Never Used  Substance and Sexual Activity   Alcohol use: Yes    Alcohol/week: 5.0 - 10.0 standard drinks of alcohol    Types: 5 - 10 Cans of beer per week    Comment: Beer   Drug use: Never   Sexual activity: Yes   Other Topics Concern   Not on file  Social History Narrative   [employer]   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[employer]   Social Drivers of Health   Financial Resource Strain: Low Risk  (08/31/2022)   Overall Financial Resource Strain (CARDIA)    Difficulty of Paying Living Expenses: Not hard at all  Food Insecurity: No Food Insecurity (08/31/2022)   Hunger Vital Sign    Worried About Running Out of Food in the Last Year: Never true    Ran Out of Food in the Last Year: Never true  Transportation Needs: No Transportation Needs (08/31/2022)   PRAPARE - Administrator, Civil Service (Medical): No    Lack of Transportation (Non-Medical): No  Physical Activity: Insufficiently Active (08/31/2022)   Exercise Vital Sign    Days of Exercise per Week: 2 days    Minutes of Exercise per Session: 20 min  Stress: No Stress Concern Present (08/31/2022)   [employer] of Occupational Health - Occupational Stress Questionnaire    Feeling of Stress : Not at all  Social Connections: Moderately Integrated (08/31/2022)   Social Connection and Isolation Panel [NHANES]  Frequency of Communication with Friends and Family: More than three times a week    Frequency of Social Gatherings with Friends and Family: Once a week    Attends Religious Services: 1 to 4 times per year    Active Member of [employer] or Organizations: No    Attends Engineer, Structural: Never    Marital Status: Married   Family History  Problem Relation Age of Onset   Heart disease Father    Cancer Mother    Lung cancer Sister        smoker   Colon cancer Neg Hx    Prostate cancer Neg Hx    Esophageal cancer Neg Hx    Rectal cancer Neg Hx    No Known Allergies Current Outpatient Medications  Medication Sig Dispense Refill   aspirin  EC 325 MG tablet Take 325 mg by mouth daily.     atorvastatin  (LIPITOR) 40 MG tablet TAKE 1 TABLET BY MOUTH DAILY 90 tablet 1   docusate sodium (COLACE) 100 MG capsule Take 200 mg by mouth  daily.     ibuprofen  (ADVIL ) 800 MG tablet Take 1 tablet (800 mg total) by mouth every 8 (eight) hours as needed. 30 tablet 0   methocarbamol  (ROBAXIN ) 500 MG tablet Take 1 tablet (500 mg total) by mouth 4 (four) times daily. 30 tablet 3   No current facility-administered medications for this visit.   No results found.  Review of Systems:   A ROS was performed including pertinent positives and negatives as documented in the HPI.   Musculoskeletal Exam:    There were no vitals taken for this visit. Left knee incisions are well-appearing without erythema or drainage.  He has no joint line tenderness.  Range of motion is from 0 to 120 degrees.  There is a small seroma around the distal tibia incision.  There is no redness or swelling around this sensation is intact distally throughout  Right knee with tenderness about the medial joint line with positive McMurray.  There is an effusion about the right knee.  There is pain with weightbearing range of motion is from -3-120 Imaging:      I personally reviewed and interpreted the radiographs.   Assessment:   12 weeks status post left knee medial lateral meniscal repair overall doing very well.  At this time he is continuing to improve.  I would like him to continue to work on strengthening on the side.  He will continue to do this.  Will plan to send any additional physical therapy prescription for combination of land and aquatic therapy dedicated to strengthen the leg.  I also believe he would benefit from some hip and core strengthening as well.  I will plan to see him back in 12 weeks for likely final check Plan :    -Return to clinic 12 weeks for reassessment      I personally saw and evaluated the patient, and participated in the management and treatment plan.  Elspeth Parker, MD Attending Physician, Orthopedic Surgery  This document was dictated using Dragon voice recognition software. A reasonable attempt at proof reading has  been made to minimize errors.

## 2023-01-20 ENCOUNTER — Ambulatory Visit: Payer: Medicare PPO | Admitting: Internal Medicine

## 2023-01-20 ENCOUNTER — Encounter: Payer: Self-pay | Admitting: Internal Medicine

## 2023-01-20 VITALS — BP 130/80 | HR 92 | Ht 70.0 in | Wt 224.0 lb

## 2023-01-20 DIAGNOSIS — Z860101 Personal history of adenomatous and serrated colon polyps: Secondary | ICD-10-CM | POA: Diagnosis not present

## 2023-01-20 DIAGNOSIS — Z52008 Unspecified donor, other blood: Secondary | ICD-10-CM

## 2023-01-20 DIAGNOSIS — Z87891 Personal history of nicotine dependence: Secondary | ICD-10-CM

## 2023-01-20 DIAGNOSIS — D500 Iron deficiency anemia secondary to blood loss (chronic): Secondary | ICD-10-CM

## 2023-01-20 DIAGNOSIS — D509 Iron deficiency anemia, unspecified: Secondary | ICD-10-CM

## 2023-01-20 DIAGNOSIS — Z860100 Personal history of colon polyps, unspecified: Secondary | ICD-10-CM

## 2023-01-20 NOTE — Progress Notes (Signed)
 Patient ID: Chad Manning, male   DOB: September 19, 1955, 68 y.o.   MRN: [id number] HPI: Discussed the use of AI scribe software for clinical note transcription with the patient, who gave verbal consent to proceed.  History of Present Illness   Chad Manning, a 68 year old individual with a history of colonoscopy on 08/11/2018, was found to have a 5mm hepatic flexure adenoma and small internal hemorrhoids who is here with his wife to evaluate transient heme negative IDA.  Last here for colonoscopy in July 2020 where a 5 mm adenomatous polyp without high-grade dysplasia was removed.  A repeat colonoscopy was recommended in seven years.   In Sept 2024 he was found to have a ferritin level of 9%, saturation of 14%, TIBC of 380, normal B12, and hemoglobin of 12.6. The FOBT was negative on 09/15/2022.  The patient's hemoglobin improved over time, reaching 14.5 on 01/03/2023, with a normal MCV of 89.7. The ferritin level recovered to 26, which was within the normal range, and the saturation was 27%, with a TIBC of 353. The patient had a history of blood donation and had a left knee meniscal root tear, which was repaired on 10/04/2022.  The patient reported a history of regular blood donation, with the last donation occurring four weeks before the initial consultation for low iron anemia. The patient noted that his hemoglobin usually runs between 13.5 and 14.5 and had been checked multiple times since the consultation, showing consistent improvement. The patient also reported dietary changes, including eating raisins and resuming breakfast, which was high in iron.  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  He has had now for total FOBT cards negative as well as consistently improved hemoglobin most recently 15 less than a week ago.  The patient had no symptoms of celiac disease, such as changes in bowel pattern, early fullness, or trouble swallowing. The patient also reported  no nausea, vomiting, or heartburn. The patient had a history of smoking and was part of a study with Gleason, requiring an annual chest CT. The patient also had a history of a left knee meniscal root tear, which was repaired on 10/04/2022.  The patient's ferritin and hemoglobin levels returned to normal without supplementation, and the stool was heme negative. The patient had not donated blood since the last donation in July, prior to the knee surgery in September. The patient was scheduled for a colonoscopy in 2027, as per the seven-year plan following the discovery of a small adenoma in 2020.        Past Medical History:  Diagnosis Date   Arthritis    Asthma    x 1 age 59   Hyperlipidemia    Right rotator cuff tear     Past Surgical History:  Procedure Laterality Date   COLONOSCOPY     JOINT REPLACEMENT     KNEE ARTHROSCOPY WITH MENISCAL REPAIR Left 10/04/2022   Procedure: LEFT KNEE ARTHROSCOPY WITH MEDIAL AND LATERAL MENISCAL REPAIR;  Surgeon: Genelle Standing, MD;  Location: [facility];  Service: Orthopedics;  Laterality: Left;   SHOULDER ARTHROSCOPY WITH ROTATOR CUFF REPAIR AND OPEN BICEPS TENODESIS Right 07/13/2021   Procedure: RIGHT SHOULDER ARTHROSCOPY WITH ROTATOR CUFF REPAIR AND BICEPS TENODESIS;  Surgeon: Genelle Standing, MD;  Location: [facility];  Service: Orthopedics;  Laterality: Right;   TOTAL HIP ARTHROPLASTY     bilatera    Outpatient Medications Prior to Visit  Medication Sig Dispense Refill  atorvastatin  (LIPITOR) 40 MG tablet TAKE 1 TABLET BY MOUTH DAILY 90 tablet 1   docusate sodium (COLACE) 100 MG capsule Take 200 mg by mouth daily.     ibuprofen  (ADVIL ) 800 MG tablet Take 1 tablet (800 mg total) by mouth every 8 (eight) hours as needed. 30 tablet 0   methocarbamol  (ROBAXIN ) 500 MG tablet Take 1 tablet (500 mg total) by mouth 4 (four) times daily. (Patient taking differently: Take 500 mg by mouth every 8 (eight) hours as needed.) 30 tablet 3   aspirin  EC  325 MG tablet Take 325 mg by mouth daily.     No facility-administered medications prior to visit.    No Known Allergies  Family History  Problem Relation Age of Onset   Heart disease Father    Cancer Mother    Lung cancer Sister        smoker   Colon cancer Neg Hx    Prostate cancer Neg Hx    Esophageal cancer Neg Hx    Rectal cancer Neg Hx     Social History   Tobacco Use   Smoking status: Former    Current packs/day: 0.00    Average packs/day: 0.5 packs/day for 40.0 years (20.0 ttl pk-yrs)    Types: Cigarettes    Start date: 80    Quit date: 2010    Years since quitting: 15.0   Smokeless tobacco: Never  Vaping Use   Vaping status: Never Used  Substance Use Topics   Alcohol use: Yes    Alcohol/week: 5.0 - 10.0 standard drinks of alcohol    Types: 5 - 10 Cans of beer per week    Comment: Beer   Drug use: Never    ROS: As per history of present illness, otherwise negative  BP 130/80   Pulse 92   Ht 5' 10 (1.778 m)   Wt 224 lb (101.6 kg)   BMI 32.14 kg/m  Gen: awake, alert, NAD HEENT: anicteric  Abd: soft, NT/ND, +BS throughout Ext: no c/c/e Neuro: nonfocal   RELEVANT LABS AND IMAGING: CBC    Component Value Date/Time   WBC 10.6 (H) 01/03/2023 1355   RBC 4.82 01/03/2023 1355   HGB 14.5 01/03/2023 1355   HCT 43.3 01/03/2023 1355   PLT 231.0 01/03/2023 1355   MCV 89.7 01/03/2023 1355   MCH 30.7 07/13/2021 1349   MCHC 33.6 01/03/2023 1355   RDW 14.8 01/03/2023 1355    CMP     Component Value Date/Time   NA 139 09/02/2022 0759   NA 137 12/31/2020 0000   K 4.4 09/02/2022 0759   CL 106 09/02/2022 0759   CO2 26 09/02/2022 0759   GLUCOSE 123 (H) 09/02/2022 0759   BUN 16 09/02/2022 0759   BUN 14 12/31/2020 0000   CREATININE 1.01 09/02/2022 0759   CALCIUM  9.4 09/02/2022 0759   PROT 6.9 09/02/2022 0759   ALBUMIN 4.1 09/02/2022 0759   AST 15 09/02/2022 0759   ALT 15 09/02/2022 0759   ALKPHOS 70 09/02/2022 0759   BILITOT 0.7 09/02/2022  0759    Results   LABS Ferritin: 9 ng/mL (09/14/2018) Transferrin saturation: 14% (09/14/2018) TIBC: 380 g/dL (90/96/7979) Vitamin B12: normal (09/14/2018) Hemoglobin: 12.6 g/dL (90/96/7979) FOBT: negative (09/15/2022) Hemoglobin: 14.5 g/dL (87/76/7975) MCV: 10.2 fL (01/03/2023) Ferritin: 26 ng/mL (01/03/2023) Transferrin saturation: 27% (01/03/2023) TIBC: 353 g/dL (87/76/7975)  RADIOLOGY Knee MRI: Left knee meniscal root tear (10/04/2022) Chest CT: Annual screening due to smoking history  DIAGNOSTIC Colonoscopy: 5 mm  hepatic flexure polyp, small internal hemorrhoids (08/11/2018)  PATHOLOGY Adenomatous polyp without high grade dysplasia (08/11/2018)      ASSESSMENT/PLAN:     Iron Deficiency Anemia Transient low iron anemia likely secondary to blood donation. Hemoglobin, ferritin, and MCV have returned to normal without supplementation. No evidence of GI blood loss with negative FOBT x 4.  Up-to-date with colonoscopy. -No further intervention required at this time. -Continue regular monitoring of hemoglobin and ferritin levels.  Colon Polyp History of 5mm adenomatous polyp in the hepatic flexure found on colonoscopy in 2020. No high-grade dysplasia. -Repeat colonoscopy scheduled for August 2027 as per guidelines.  General Health Maintenance Regular blood donor. -Consider resuming blood donation now that ferritin levels have normalized. -Continue annual chest CT as part of a study due to past smoking history.       Rr:Ejmxzm, Caleb M, Md [address] [place],  [place] [address]

## 2023-01-20 NOTE — Patient Instructions (Signed)
 Follow up with Dr. Albertus as needed.   _______________________________________________________  If your blood pressure at your visit was 140/90 or greater, please contact your primary care physician to follow up on this.  _______________________________________________________  If you are age 68 or older, your body mass index should be between 23-30. Your Body mass index is 32.14 kg/m. If this is out of the aforementioned range listed, please consider follow up with your Primary Care Provider.  If you are age 15 or younger, your body mass index should be between 19-25. Your Body mass index is 32.14 kg/m. If this is out of the aformentioned range listed, please consider follow up with your Primary Care Provider.   ________________________________________________________  The [facility] providers would like to encourage you to use MYCHART to communicate with providers for non-urgent requests or questions.  Due to long hold times on the telephone, sending your provider a message by [facility] may be a faster and more efficient way to get a response.  Please allow 48 business hours for a response.  Please remember that this is for non-urgent requests.  _______________________________________________________

## 2023-02-07 ENCOUNTER — Encounter: Payer: Medicare PPO | Admitting: Internal Medicine

## 2023-02-17 ENCOUNTER — Ambulatory Visit (HOSPITAL_BASED_OUTPATIENT_CLINIC_OR_DEPARTMENT_OTHER): Payer: Medicare PPO | Attending: Orthopaedic Surgery | Admitting: Physical Therapy

## 2023-02-17 ENCOUNTER — Encounter (HOSPITAL_BASED_OUTPATIENT_CLINIC_OR_DEPARTMENT_OTHER): Payer: Self-pay | Admitting: Physical Therapy

## 2023-02-17 DIAGNOSIS — M6281 Muscle weakness (generalized): Secondary | ICD-10-CM | POA: Diagnosis not present

## 2023-02-17 DIAGNOSIS — M25562 Pain in left knee: Secondary | ICD-10-CM | POA: Diagnosis not present

## 2023-02-17 DIAGNOSIS — M25662 Stiffness of left knee, not elsewhere classified: Secondary | ICD-10-CM | POA: Diagnosis not present

## 2023-02-17 DIAGNOSIS — R262 Difficulty in walking, not elsewhere classified: Secondary | ICD-10-CM | POA: Insufficient documentation

## 2023-02-17 DIAGNOSIS — S83282A Other tear of lateral meniscus, current injury, left knee, initial encounter: Secondary | ICD-10-CM | POA: Diagnosis not present

## 2023-02-17 NOTE — Therapy (Addendum)
 OUTPATIENT PHYSICAL THERAPY TREATMENT NOTE PHYSICAL THERAPY DISCHARGE SUMMARY  Visits from Start of Care: 9  Current functional level related to goals / functional outcomes: Unknown as patient has not returned since 02/17/23.   Remaining deficits: Unknown   Education / Equipment: HEP   Patient agrees to discharge. Patient goals were not met. Patient is being discharged due to not returning since the last visit. 2:58 PM, 06/08/23 Beather Liming PT, DPT Physical Therapist at [facility]    Patient Name: Chad Manning MRN: [id number] DOB:1955/10/12, 68 y.o., male Today's Date: 02/17/2023  Progress Note   Reporting Period 10/08/22 to 02/17/23   See note below for Objective Data and Assessment of Progress/Goals   END OF SESSION:  PT End of Session - 02/17/23 0935     Visit Number 9    Number of Visits 28    Date for PT Re-Evaluation 04/14/23    Authorization Type [employer]    Progress Note Due on Visit 19    PT Start Time 0935    PT Stop Time 1013    PT Time Calculation (min) 38 min    Activity Tolerance Patient tolerated treatment well    Behavior During Therapy WFL for tasks assessed/performed                    Past Medical History:  Diagnosis Date   Arthritis    Asthma    x 1 age 54   Hyperlipidemia    Right rotator cuff tear    Past Surgical History:  Procedure Laterality Date   COLONOSCOPY     JOINT REPLACEMENT     KNEE ARTHROSCOPY WITH MENISCAL REPAIR Left 10/04/2022   Procedure: LEFT KNEE ARTHROSCOPY WITH MEDIAL AND LATERAL MENISCAL REPAIR;  Surgeon: Wilhelmenia Harada, MD;  Location: [facility];  Service: Orthopedics;  Laterality: Left;   SHOULDER ARTHROSCOPY WITH ROTATOR CUFF REPAIR AND OPEN BICEPS TENODESIS Right 07/13/2021   Procedure: RIGHT SHOULDER ARTHROSCOPY WITH ROTATOR CUFF REPAIR AND BICEPS TENODESIS;  Surgeon: Wilhelmenia Harada, MD;  Location: [facility] OR;  Service: Orthopedics;  Laterality: Right;   TOTAL HIP ARTHROPLASTY      bilatera   Patient Active Problem List   Diagnosis Date Noted   Acute lateral meniscus tear of left knee 10/04/2022   Acute medial meniscus tear of left knee 10/04/2022   Traumatic complete tear of right rotator cuff    Former smoker 12/14/2019   Acquired hallux rigidus of right foot 11/29/2018   Dyslipidemia 03/15/2018   Hyperglycemia 03/15/2018     REFERRING PROVIDER: Wilhelmenia Harada, MD  REFERRING DIAG: [phone] (ICD-10-CM) - Chronic pain of left knee   S/p L knee medial and lateral meniscal repair  THERAPY DIAG:  Muscle weakness (generalized)  Stiffness of left knee, not elsewhere classified  Difficulty in walking, not elsewhere classified  Left knee pain, unspecified chronicity  Rationale for Evaluation and Treatment: Rehabilitation  ONSET DATE: DOS 10/04/2022  SUBJECTIVE:   SUBJECTIVE STATEMENT: Pt is 19 s/p L knee medial meniscal root repair and lateral meniscal repair.  Has started working out. Wants some direction in working out. Hip on L is an issue to some degree. R knee has meniscus tear as well. Wants to help his hip. Thinks his hip is reason for some of his limp.     PERTINENT HISTORY: L knee medial meniscal root repair and lateral meniscal repair on 10/04/22.  WBAT   R shoulder arthroscopic rotator cuff repair of supraspinatus, infraspinatus, and subscapularis,  biceps tenodesis, and subacromial decompression on 07/13/2021.  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  Pt has a hx of bilat THA   WEIGHT BEARING RESTRICTIONS: Yes WBAT  FALLS:  Has patient fallen in last 6 months? No  LIVING ENVIRONMENT: Lives with: lives with their spouse Lives in: 1 story home Stairs: 2 steps without rail to enter home Has following equipment at home: bilat crutches, FWW  OCCUPATION: Pt is retired.  PLOF: Independent.  Pt was ambulating without an AD.  Pt was able  to perform his daily activities and functional mobility skills independently.   PATIENT GOALS: return to PLOF  NEXT MD VISIT: 10/20/2022  OBJECTIVE:   DIAGNOSTIC FINDINGS: Pt is post op.  He had x rays and MRI prior to surgery.  This did show tricompartmental degenerative changes.  LEFS 02/17/23: 72/80  LOWER EXTREMITY  ROM:  able to achieve lacking 1 on L with overpressure  Active ROM Right 02/17/2023 Left 02/17/2023  Hip flexion    Hip extension    Hip abduction    Hip adduction    Hip internal rotation    Hip external rotation    Knee flexion 125 125  Knee extension Lacking 1 Lacking 3  Ankle dorsiflexion    Ankle plantarflexion    Ankle inversion    Ankle eversion     (Blank rows = not tested)*= pain  LOWER EXTREMITY MMT:  MMT Right 02/17/2023 Left 02/17/2023  Hip flexion 5 4  Hip extension 4+ 4  Hip abduction 4+ 4-  Hip adduction    Hip internal rotation    Hip external rotation    Knee flexion 5 5  Knee extension 5 5  Ankle dorsiflexion    Ankle plantarflexion    Ankle inversion    Ankle eversion     (Blank rows = not tested) *= pain  Palpation: Minimally tender L hip flexors   Gait: 100 feet, trunk flexed, lacking TKE bilaterally L>R, decreased hip extension ROM L>R   TODAY'S TREATMENT:     02/17/23 Reassessment  Education   12/01/22 OBSERVATION: Pt continues to have a knot under distal incision.   Gait:  Pt has a limp with gait though has improved with quality of gait.                 Ther Ex:   Reviewed current function, HEP compliance, pain level, and response to prior Rx.  Pt performed:  Scifit bike x 7 mins Heel raises 3x10 with UE support TKE with GTB 3x10 Mini squats 2x10 Cybex leg press 40# x10, 50# 2x10 bilat Standing Hip abd L LE only 2x10 Prone knee flexion AROM 2x10 Supine SLR 2x10 with 1#    Pt received L knee flexion and extension PROM in supine per pt and tissue tolerance w/n protocol ranges.    PATIENT EDUCATION:   Education details:  post op and surgical restrictions and limitations, exercise form, protocol, rationale of interventions, dx, HEP, POC, and relevant anatomy.    02/17/23: reassessment findings, POC, HEP, exercise routines, deficits Person educated: Patient Education method: Explanation, Demonstration, Tactile cues, Verbal cues Education comprehension: verbalized understanding, returned demonstration, verbal cues required, tactile cues required, and needs further education  HOME EXERCISE PROGRAM: Access Code: [email or web address] URL: [email or web address] Date: 10/08/2022 Prepared by: Marnie Siren  Exercises - Supine Quadricep Sets  - 2 x daily - 7 x weekly -  2 sets - 10 reps - 5 seconds hold - ANKLE PUMPS  - 7 x weekly - Supine Active Straight Leg Raise  - 1 x daily - 6-7 x weekly - 2 sets - 10 reps - Supine Knee Extension Stretch on Towel Roll  - 2 x daily - 7 x weekly - 1 reps - 2 minutes hold - Supine Heel Slide with Strap w/n protocol range - 2 x daily - 7 x weekly - 2 sets - 10 reps - Sidelying Hip Abduction  - 1 x daily - 7 x weekly - 2 sets - 10 reps - Long Sitting Calf Stretch with Strap  - 2 x daily - 7 x weekly - 2-3 reps - 20-30 second hold - Heel Raises with Counter Support  - 1 x daily - 6-7 x weekly - 2 sets - 10 reps - Standing Terminal Knee Extension with Resistance  - 1 x daily - 5 x weekly - 2-3 sets - 10 reps  ASSESSMENT:  CLINICAL IMPRESSION: Patient has met 2/7 short term goals and 0/4 long term goals with ability to complete HEP and improvement in symptoms, ROM.  Remaining goals not met due to continued deficits in strength, ROM, activity tolerance, gait, balance, and functional mobility but he has made good progress toward remaining goals. Extending POC 1x/week for 8 weeks to improve end range knee extension, hip and quad strength, core strength, and functional strength/mobility. Will focus on building HEP.  Patient will continue to benefit from skilled physical  therapy in order to improve function and reduce impairment.        OBJECTIVE IMPAIRMENTS: Abnormal gait, decreased activity tolerance, decreased endurance, decreased mobility, difficulty walking, decreased ROM, decreased strength, hypomobility, increased edema, impaired flexibility, and pain.   ACTIVITY LIMITATIONS: lifting, bending, standing, squatting, stairs, transfers, and locomotion level  PARTICIPATION LIMITATIONS: meal prep, cleaning, driving, shopping, community activity, and yard work  PERSONAL FACTORS: 1 comorbidity: bilat THA are also affecting patient's functional outcome.   REHAB POTENTIAL: Good  CLINICAL DECISION MAKING: Stable/uncomplicated  EVALUATION COMPLEXITY: Low   GOALS:   SHORT TERM GOALS:   Pt will be independent and compliant with HEP for improved strength, ROM, pain, and function.  Baseline: Goal status: INITIAL Target date:  11/05/2022   2.  Pt will demo L knee PROM 0 - 90 deg for improved stiffness and mobility.  Baseline:  Goal status: partially met Target date:  11/12/2022   3.  Pt will demo a good quad set and be able to perform supine SLR independently with no > than minimal extensor lag for improved quad strength and to assist with preparing for gait Baseline:  Goal status: INITIAL Target date:  11/05/2022  4.  Pt will progress with exercises per protocol without adverse effects for improved strength and mobility.  Baseline:  Goal status: INITIAL Target date:  11/19/2022   5.  Pt will demo L knee AROM to 0 to 115-120 deg for improved mobility.   Baseline:  Goal status: Partially met lacking extension Target date:  12/31/2022  6.  Pt will be able to perform a 6 inch step up with good form and control.  Baseline:  Goal status: INITIAL Target date:  12/17/2022  7.  Pt will ambulate with a normalized heel to toe gait pattern without AD.   Baseline:  Goals status:  INITIAL  Target date:  12/17/2022    LONG TERM GOALS: Target  date: 01/27/2022   Pt will be able to perform  his ADLs/IADLs without significant pain and difficulty.  Baseline:  Goal status: INITIAL  2.  Pt will be able to perform stairs with a reciprocal gait with the rail. Baseline:  Goal status: INITIAL  3.  Pt will ambulate extended community distance without difficulty and significant pain.  Baseline:  Goal status: INITIAL  4.  Pt will demo 5/5 strength in all tested musculature for improved performance of and tolerance with functional mobility  Baseline:  Goal status: INITIAL     PLAN:  PT FREQUENCY: 1x/week  PT DURATION: other: 8 weeks  PLANNED INTERVENTIONS: Therapeutic exercises, Therapeutic activity, Neuromuscular re-education, Balance training, Gait training, Patient/Family education, Self Care, Joint mobilization, Stair training, DME instructions, Aquatic Therapy, Dry Needling, Electrical stimulation, Cryotherapy, Moist heat, scar mobilization, Taping, Ultrasound, Manual therapy, and Re-evaluation  PLAN FOR NEXT SESSION: quad and glute strength; focus on building HEP     Beather Liming, PT 02/17/2023, 10:16 AM    Referring diagnosis? J19.147W (ICD-10-CM) - Acute lateral meniscus tear of left knee, initial encounter Treatment diagnosis? (if different than referring diagnosis) M62.81  What was this (referring dx) caused by? [x]  Surgery []  Fall [x]  Ongoing issue []  Arthritis []  Other: ____________  Laterality: []  Rt []  Lt [x]  Both  Check all possible CPT codes:  *CHOOSE 10 OR LESS*    See Planned Interventions listed in the Plan section of the Evaluation.

## 2023-02-21 ENCOUNTER — Other Ambulatory Visit: Payer: Self-pay | Admitting: Family Medicine

## 2023-02-22 ENCOUNTER — Other Ambulatory Visit: Payer: Self-pay

## 2023-02-22 ENCOUNTER — Encounter (HOSPITAL_BASED_OUTPATIENT_CLINIC_OR_DEPARTMENT_OTHER): Payer: Medicare PPO | Admitting: Physical Therapy

## 2023-02-22 ENCOUNTER — Other Ambulatory Visit (HOSPITAL_COMMUNITY): Payer: Self-pay

## 2023-02-22 MED ORDER — IBUPROFEN 800 MG PO TABS
800.0000 mg | ORAL_TABLET | Freq: Three times a day (TID) | ORAL | 0 refills | Status: DC | PRN
  Filled 2023-02-22: qty 30, 10d supply, fill #0

## 2023-02-23 ENCOUNTER — Encounter (HOSPITAL_BASED_OUTPATIENT_CLINIC_OR_DEPARTMENT_OTHER): Payer: Self-pay

## 2023-03-08 ENCOUNTER — Encounter (HOSPITAL_BASED_OUTPATIENT_CLINIC_OR_DEPARTMENT_OTHER): Payer: Medicare PPO | Admitting: Physical Therapy

## 2023-03-10 ENCOUNTER — Encounter (HOSPITAL_BASED_OUTPATIENT_CLINIC_OR_DEPARTMENT_OTHER): Payer: Medicare PPO | Admitting: Physical Therapy

## 2023-03-15 ENCOUNTER — Encounter (HOSPITAL_BASED_OUTPATIENT_CLINIC_OR_DEPARTMENT_OTHER): Payer: Medicare PPO | Admitting: Physical Therapy

## 2023-03-16 ENCOUNTER — Ambulatory Visit (HOSPITAL_BASED_OUTPATIENT_CLINIC_OR_DEPARTMENT_OTHER): Payer: Medicare PPO | Admitting: Physical Therapy

## 2023-03-22 ENCOUNTER — Encounter (HOSPITAL_BASED_OUTPATIENT_CLINIC_OR_DEPARTMENT_OTHER): Payer: Medicare PPO | Admitting: Physical Therapy

## 2023-03-29 ENCOUNTER — Encounter (HOSPITAL_BASED_OUTPATIENT_CLINIC_OR_DEPARTMENT_OTHER): Payer: Medicare PPO | Admitting: Physical Therapy

## 2023-03-31 ENCOUNTER — Ambulatory Visit
Admission: RE | Admit: 2023-03-31 | Discharge: 2023-03-31 | Disposition: A | Discharge status: Home / Self Care | Payer: Medicare PPO | Source: Ambulatory Visit | Attending: Acute Care | Admitting: Acute Care

## 2023-03-31 DIAGNOSIS — Z122 Encounter for screening for malignant neoplasm of respiratory organs: Secondary | ICD-10-CM

## 2023-03-31 DIAGNOSIS — Z87891 Personal history of nicotine dependence: Secondary | ICD-10-CM

## 2023-04-05 ENCOUNTER — Encounter (HOSPITAL_BASED_OUTPATIENT_CLINIC_OR_DEPARTMENT_OTHER): Payer: Medicare PPO | Admitting: Physical Therapy

## 2023-04-06 ENCOUNTER — Other Ambulatory Visit (HOSPITAL_COMMUNITY): Payer: Self-pay

## 2023-04-06 ENCOUNTER — Other Ambulatory Visit: Payer: Self-pay | Admitting: Family Medicine

## 2023-04-06 DIAGNOSIS — E785 Hyperlipidemia, unspecified: Secondary | ICD-10-CM

## 2023-04-06 MED ORDER — IBUPROFEN 800 MG PO TABS
800.0000 mg | ORAL_TABLET | Freq: Three times a day (TID) | ORAL | 0 refills | Status: DC | PRN
  Filled 2023-04-06: qty 30, 10d supply, fill #0

## 2023-04-06 MED ORDER — ATORVASTATIN CALCIUM 40 MG PO TABS
40.0000 mg | ORAL_TABLET | Freq: Every day | ORAL | 1 refills | Status: DC
  Filled 2023-04-06: qty 90, 90d supply, fill #0
  Filled 2023-07-12: qty 90, 90d supply, fill #1

## 2023-04-12 ENCOUNTER — Encounter (HOSPITAL_BASED_OUTPATIENT_CLINIC_OR_DEPARTMENT_OTHER): Payer: Medicare PPO | Admitting: Physical Therapy

## 2023-04-19 ENCOUNTER — Telehealth: Payer: Self-pay | Admitting: *Deleted

## 2023-04-19 ENCOUNTER — Encounter (HOSPITAL_BASED_OUTPATIENT_CLINIC_OR_DEPARTMENT_OTHER): Payer: Medicare PPO | Admitting: Physical Therapy

## 2023-04-19 DIAGNOSIS — Z87891 Personal history of nicotine dependence: Secondary | ICD-10-CM

## 2023-04-19 DIAGNOSIS — Z122 Encounter for screening for malignant neoplasm of respiratory organs: Secondary | ICD-10-CM

## 2023-04-19 NOTE — Telephone Encounter (Signed)
 Left VM for patient to call to discuss lung screening CT results. Need to advise on having PCP order an Echo and make PCP aware.

## 2023-04-19 NOTE — Telephone Encounter (Signed)
 Spoke with patient regarding lung screening Results. Small lung nodules seen that will be monitored at yearly scan. Aortic calcification noted with possible need for Echo. Pt states that his wife is a cardiac nurse and they have discussed this and pt will plan to follow up with his PCP to get this scheduled. Results sent to PCP with included note regarding need to Echo.  Order placed for 12 mth lung screening CT.

## 2023-05-11 ENCOUNTER — Other Ambulatory Visit (HOSPITAL_COMMUNITY): Payer: Self-pay

## 2023-05-11 MED ORDER — AMOXICILLIN 500 MG PO CAPS
ORAL_CAPSULE | ORAL | 1 refills | Status: AC
  Filled 2023-05-11: qty 18, 4d supply, fill #0

## 2023-06-05 ENCOUNTER — Other Ambulatory Visit: Payer: Self-pay | Admitting: Family Medicine

## 2023-06-07 ENCOUNTER — Other Ambulatory Visit (HOSPITAL_COMMUNITY): Payer: Self-pay

## 2023-06-07 MED ORDER — IBUPROFEN 800 MG PO TABS
800.0000 mg | ORAL_TABLET | Freq: Three times a day (TID) | ORAL | 0 refills | Status: DC | PRN
  Filled 2023-06-07: qty 30, 10d supply, fill #0

## 2023-07-12 ENCOUNTER — Other Ambulatory Visit: Payer: Self-pay | Admitting: Family Medicine

## 2023-07-13 ENCOUNTER — Other Ambulatory Visit (HOSPITAL_COMMUNITY): Payer: Self-pay

## 2023-07-13 MED ORDER — IBUPROFEN 800 MG PO TABS
800.0000 mg | ORAL_TABLET | Freq: Three times a day (TID) | ORAL | 0 refills | Status: DC | PRN
  Filled 2023-07-13: qty 30, 10d supply, fill #0

## 2023-08-11 ENCOUNTER — Other Ambulatory Visit: Payer: Self-pay | Admitting: Family Medicine

## 2023-08-11 ENCOUNTER — Other Ambulatory Visit (HOSPITAL_COMMUNITY): Payer: Self-pay

## 2023-08-11 ENCOUNTER — Other Ambulatory Visit: Payer: Self-pay

## 2023-08-25 ENCOUNTER — Other Ambulatory Visit (HOSPITAL_COMMUNITY): Payer: Self-pay

## 2023-08-26 ENCOUNTER — Encounter (HOSPITAL_COMMUNITY): Payer: Self-pay | Admitting: Pharmacist

## 2023-08-26 ENCOUNTER — Other Ambulatory Visit (HOSPITAL_COMMUNITY): Payer: Self-pay

## 2023-09-09 ENCOUNTER — Encounter (HOSPITAL_BASED_OUTPATIENT_CLINIC_OR_DEPARTMENT_OTHER): Payer: Self-pay | Admitting: Orthopaedic Surgery

## 2023-09-13 ENCOUNTER — Other Ambulatory Visit: Payer: Self-pay | Admitting: Orthopaedic Surgery

## 2023-09-13 ENCOUNTER — Other Ambulatory Visit (HOSPITAL_COMMUNITY): Payer: Self-pay

## 2023-09-13 MED ORDER — IBUPROFEN 800 MG PO TABS
800.0000 mg | ORAL_TABLET | Freq: Three times a day (TID) | ORAL | 4 refills | Status: AC
  Filled 2023-09-13: qty 30, 10d supply, fill #0
  Filled 2024-01-09: qty 30, 10d supply, fill #1

## 2023-09-13 MED ORDER — IBUPROFEN 800 MG PO TABS: 800.0000 mg | ORAL_TABLET | Freq: Three times a day (TID) | ORAL | 4 refills | Status: DC

## 2023-09-17 IMAGING — CT CT CHEST LUNG CANCER SCREENING LOW DOSE W/O CM
1 series · 15 of 33 positions shown, 19 images · non-contrast
Comparison: 01/30/2020 screening chest CT.

CLINICAL DATA: 65-year-old asymptomatic male former smoker with 30
pack-year smoking history, quit smoking 6 years prior.



[Series 2: ldct screen -soft · axial · 0.89mm/px · z∈[+803,+1108]mm · 15 of 73 slices shown, 19 images]
[im 6/73  mediastinal]
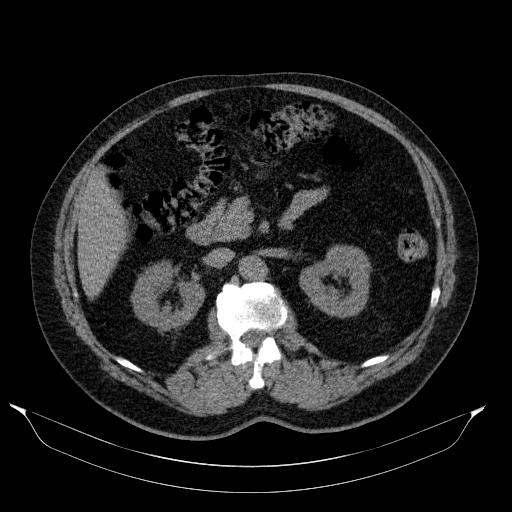
[im 6/73  lung]
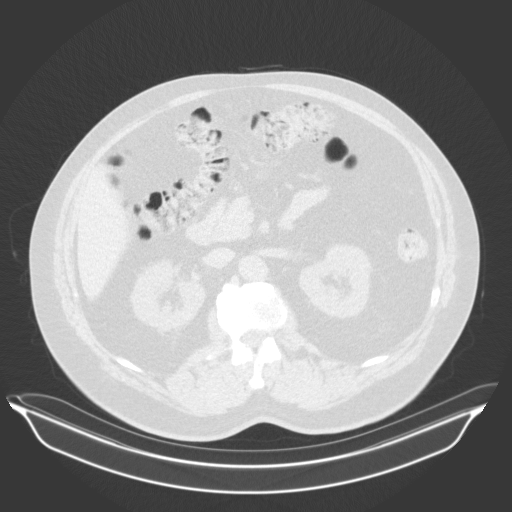
[im 11/73  lung]
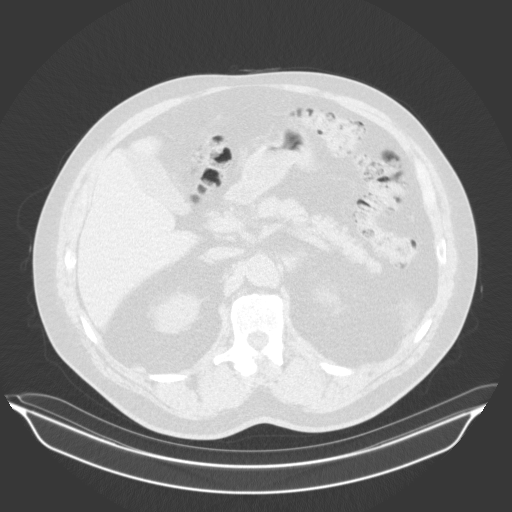
[im 15/73  lung]
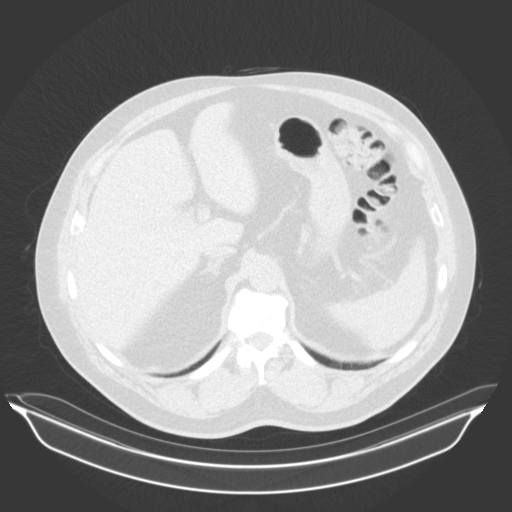
[im 19/73  lung]
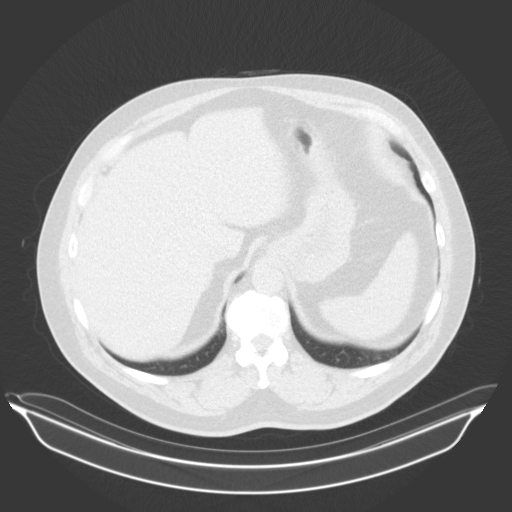
[im 25/73  mediastinal]
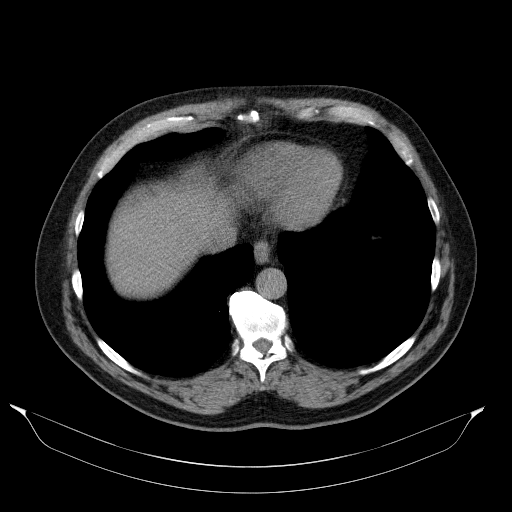
[im 25/73  lung]
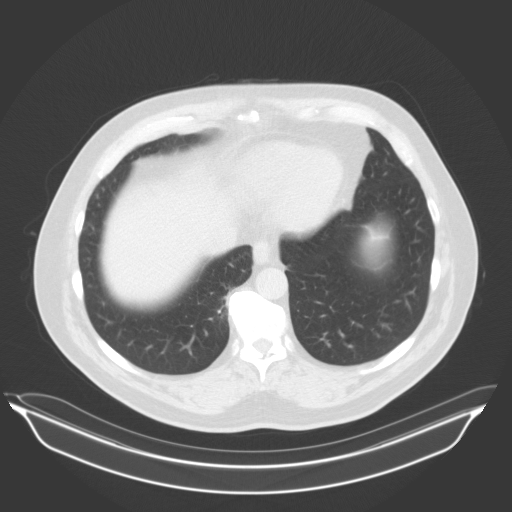
[im 29/73  lung]
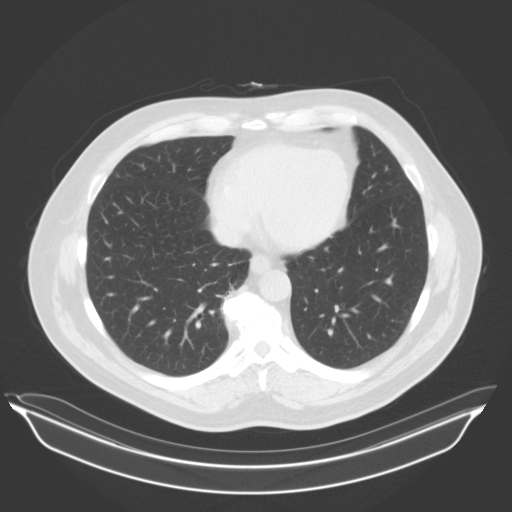
[im 33/73  lung]
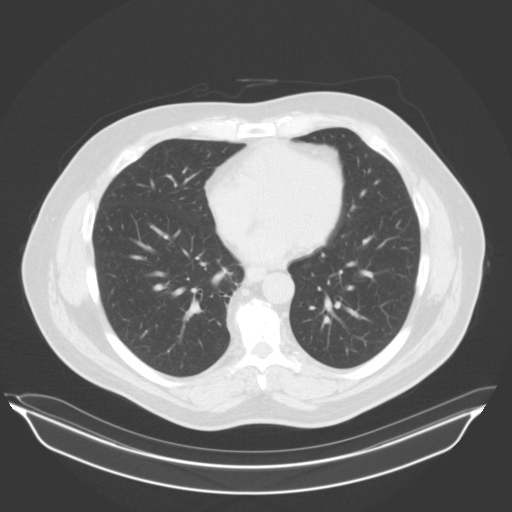
[im 38/73  lung]
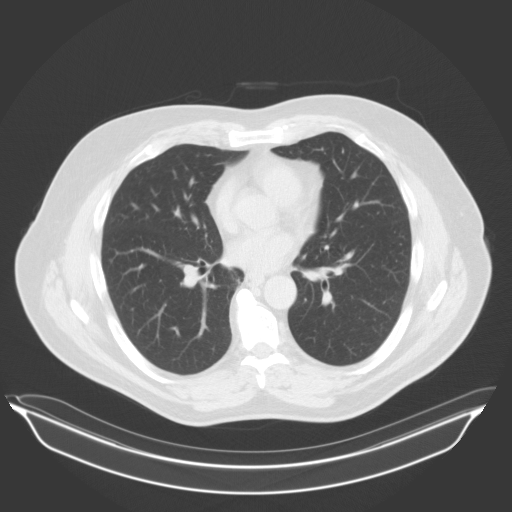
[im 41/73  mediastinal]
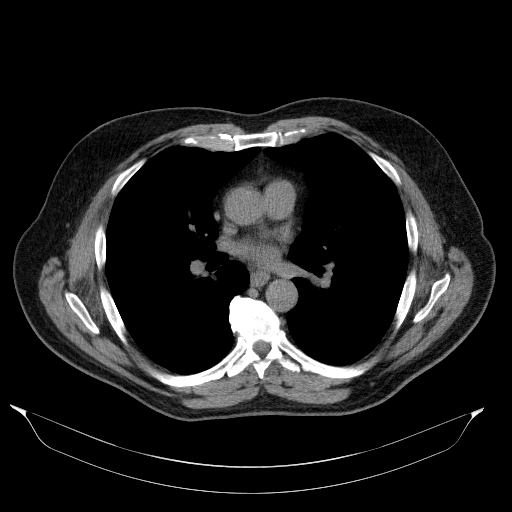
[im 41/73  lung]
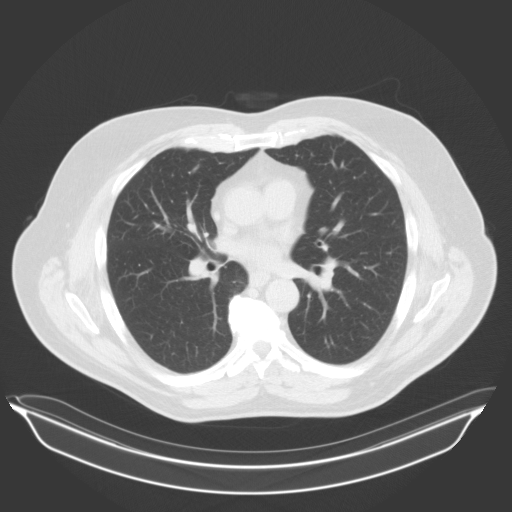
[im 44/73  lung]
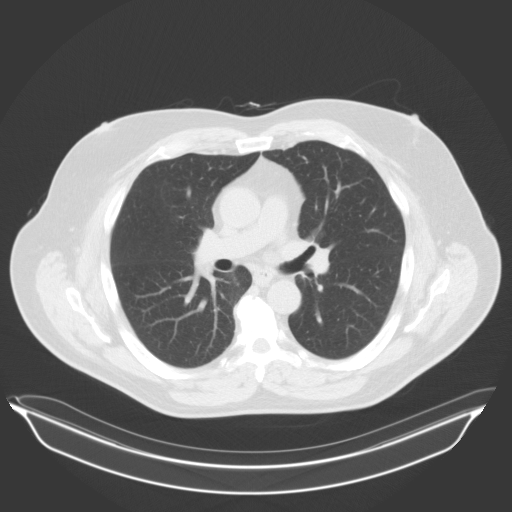
[im 49/73  lung]
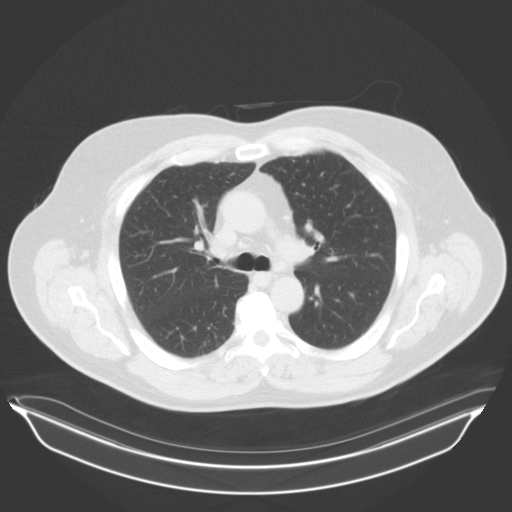
[im 54/73  lung]
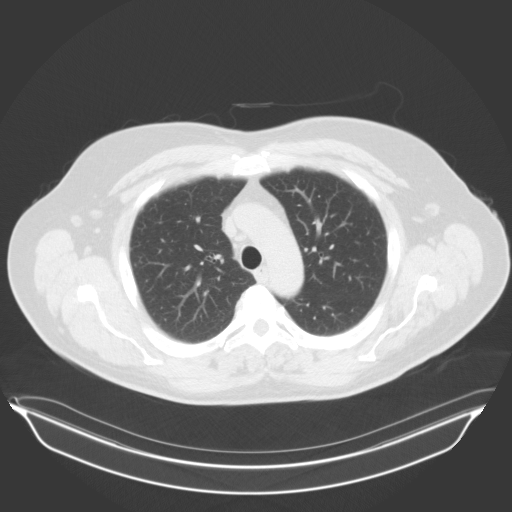
[im 58/73  mediastinal]
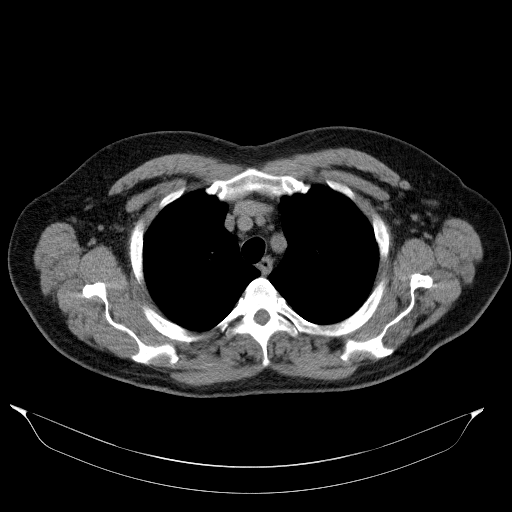
[im 58/73  lung]
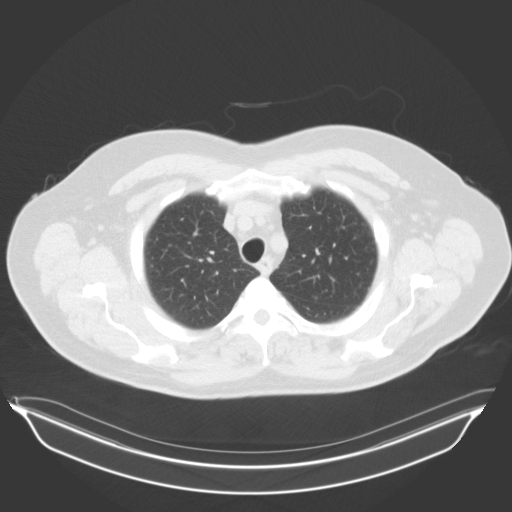
[im 62/73  lung]
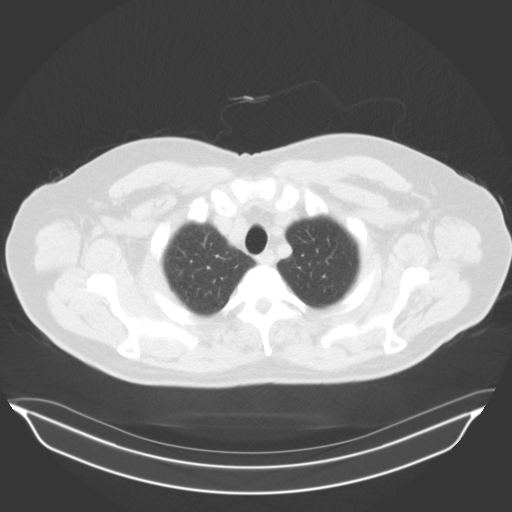
[im 67/73  lung]
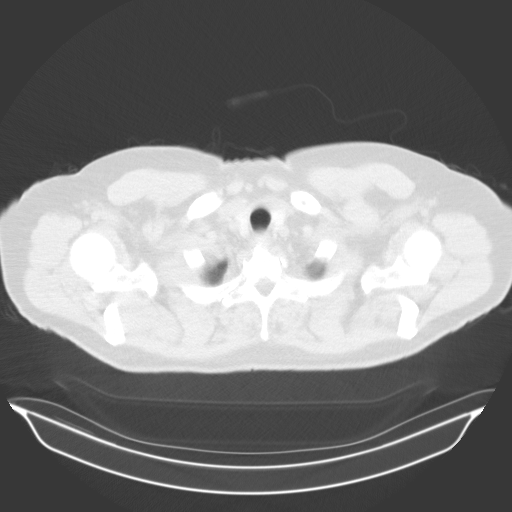

[15 of 33 positions shown; findings below may reference images not displayed]

FINDINGS: Cardiovascular: Normal heart size. No significant pericardial
effusion/thickening. Atherosclerotic nonaneurysmal thoracic aorta.
Normal caliber pulmonary arteries.

Mediastinum/Nodes: No discrete thyroid nodules. Unremarkable
esophagus. No pathologically enlarged axillary, mediastinal or hilar
lymph nodes, noting limited sensitivity for the detection of hilar
adenopathy on this noncontrast study.

Lungs/Pleura: No pneumothorax. No pleural effusion. Mild
centrilobular emphysema with diffuse bronchial wall thickening. No
acute consolidative airspace disease or lung masses. No significant
growth of previously visualized pulmonary nodules. No new
significant pulmonary nodules.

Upper abdomen: No acute abnormality.

Musculoskeletal: No aggressive appearing focal osseous lesions.
Moderate thoracic spondylosis.
IMPRESSION: Lung-RADS 2, benign appearance or behavior. Continue annual
screening with low-dose chest CT without contrast in 12 months.

Aortic Atherosclerosis ([id number].N) and Emphysema ([id number]).

## 2023-09-22 ENCOUNTER — Other Ambulatory Visit (HOSPITAL_BASED_OUTPATIENT_CLINIC_OR_DEPARTMENT_OTHER): Payer: Self-pay | Admitting: Orthopaedic Surgery

## 2023-09-23 ENCOUNTER — Other Ambulatory Visit: Payer: Self-pay

## 2023-09-23 ENCOUNTER — Other Ambulatory Visit (HOSPITAL_COMMUNITY): Payer: Self-pay

## 2023-09-23 MED ORDER — METHOCARBAMOL 500 MG PO TABS
500.0000 mg | ORAL_TABLET | Freq: Four times a day (QID) | ORAL | 3 refills | Status: AC
  Filled 2023-09-23: qty 30, 8d supply, fill #0
  Filled 2024-01-09: qty 30, 8d supply, fill #1
  Filled 2024-02-09: qty 30, 8d supply, fill #2

## 2023-10-04 ENCOUNTER — Other Ambulatory Visit: Payer: Self-pay | Admitting: Family Medicine

## 2023-10-04 DIAGNOSIS — H524 Presbyopia: Secondary | ICD-10-CM | POA: Diagnosis not present

## 2023-10-04 DIAGNOSIS — H02831 Dermatochalasis of right upper eyelid: Secondary | ICD-10-CM | POA: Diagnosis not present

## 2023-10-04 DIAGNOSIS — H2513 Age-related nuclear cataract, bilateral: Secondary | ICD-10-CM | POA: Diagnosis not present

## 2023-10-04 DIAGNOSIS — H0100A Unspecified blepharitis right eye, upper and lower eyelids: Secondary | ICD-10-CM | POA: Diagnosis not present

## 2023-10-04 DIAGNOSIS — E785 Hyperlipidemia, unspecified: Secondary | ICD-10-CM

## 2023-10-04 DIAGNOSIS — H02834 Dermatochalasis of left upper eyelid: Secondary | ICD-10-CM | POA: Diagnosis not present

## 2023-10-05 ENCOUNTER — Other Ambulatory Visit (HOSPITAL_COMMUNITY): Payer: Self-pay

## 2023-10-05 MED ORDER — ATORVASTATIN CALCIUM 40 MG PO TABS
40.0000 mg | ORAL_TABLET | Freq: Every day | ORAL | 0 refills | Status: DC
  Filled 2023-10-05: qty 30, 30d supply, fill #0

## 2023-10-12 ENCOUNTER — Ambulatory Visit (INDEPENDENT_AMBULATORY_CARE_PROVIDER_SITE_OTHER): Admitting: Internal Medicine

## 2023-10-12 ENCOUNTER — Encounter: Payer: Self-pay | Admitting: Internal Medicine

## 2023-10-12 ENCOUNTER — Other Ambulatory Visit (HOSPITAL_COMMUNITY): Payer: Self-pay

## 2023-10-12 VITALS — BP 156/84 | HR 70 | Temp 98.3°F | Ht 70.0 in | Wt 224.2 lb

## 2023-10-12 DIAGNOSIS — Z87891 Personal history of nicotine dependence: Secondary | ICD-10-CM

## 2023-10-12 DIAGNOSIS — E785 Hyperlipidemia, unspecified: Secondary | ICD-10-CM

## 2023-10-12 DIAGNOSIS — R011 Cardiac murmur, unspecified: Secondary | ICD-10-CM

## 2023-10-12 DIAGNOSIS — R739 Hyperglycemia, unspecified: Secondary | ICD-10-CM | POA: Diagnosis not present

## 2023-10-12 DIAGNOSIS — Z0000 Encounter for general adult medical examination without abnormal findings: Secondary | ICD-10-CM | POA: Diagnosis not present

## 2023-10-12 DIAGNOSIS — Z23 Encounter for immunization: Secondary | ICD-10-CM

## 2023-10-12 MED ORDER — VITAMIN D3 50 MCG (2000 UT) PO CAPS
2000.0000 [IU] | ORAL_CAPSULE | Freq: Every day | ORAL | 3 refills | Status: AC
  Filled 2023-10-12: qty 100, 100d supply, fill #0
  Filled 2024-01-09: qty 100, 100d supply, fill #1

## 2023-10-12 MED ORDER — ASPIRIN 81 MG PO TBEC: 81.0000 mg | DELAYED_RELEASE_TABLET | Freq: Every day | ORAL | Status: AC

## 2023-10-12 MED ORDER — ATORVASTATIN CALCIUM 40 MG PO TABS
40.0000 mg | ORAL_TABLET | Freq: Every day | ORAL | 3 refills | Status: AC
  Filled 2023-10-12 – 2023-11-06 (×2): qty 90, 90d supply, fill #0
  Filled 2024-02-09: qty 90, 90d supply, fill #1

## 2023-10-12 NOTE — Patient Instructions (Signed)

## 2023-10-12 NOTE — Assessment & Plan Note (Addendum)
 He is a former smoker.  He has been getting low intensity chest CT q 12 mo for lung cancer screening

## 2023-10-12 NOTE — Assessment & Plan Note (Addendum)
 Strong family history of heart disease, currently on Lipitor. Added ASA, Vit D Coronary calcium  CT was offered once in place of He is regular yearly chest CT scan

## 2023-10-12 NOTE — Assessment & Plan Note (Signed)
Monitor A1c 

## 2023-10-12 NOTE — Progress Notes (Addendum)
 Subjective:  Patient ID: Chad Manning, male    DOB: 07-29-1955  Age: 68 y.o. MRN: [id number]  CC: Establish Care (Establishing Care. Notes no concerns )   HPI Chad Manning presents for a well exam. Pt lives nearby and would like to switch to a closer location. He is a retired interior and spatial designer His wife is a cardiology nurse  Outpatient Medications Prior to Visit  Medication Sig Dispense Refill   amoxicillin  (AMOXIL ) 500 MG capsule Take 4 capsules 1 hour prior to dental treatment. 18 capsule 1   docusate sodium (COLACE) 100 MG capsule Take 200 mg by mouth daily.     ibuprofen  (ADVIL ) 800 MG tablet Take 1 tablet (800 mg total) by mouth every 8 (eight) hours with food. Please alternate with Tylenol . 30 tablet 4   methocarbamol  (ROBAXIN ) 500 MG tablet Take 1 tablet (500 mg total) by mouth 4 (four) times daily. 30 tablet 3   atorvastatin  (LIPITOR) 40 MG tablet Take 1 tablet (40 mg total) by mouth daily. Need Office visit for future refills 30 tablet 0   ibuprofen  (ADVIL ) 800 MG tablet Take 1 tablet (800 mg total) by mouth every 8 (eight) hours. Please take with food, please alternate with acetaminophen  30 tablet 4   ibuprofen  (ADVIL ) 800 MG tablet Take 1 tablet (800 mg total) by mouth every 8 (eight) hours as needed. 30 tablet 0   No facility-administered medications prior to visit.    ROS: Review of Systems  Constitutional:  Negative for appetite change, fatigue and unexpected weight change.  HENT:  Negative for congestion, nosebleeds, sneezing, sore throat and trouble swallowing.   Eyes:  Negative for itching and visual disturbance.  Respiratory:  Negative for cough.   Cardiovascular:  Negative for chest pain, palpitations and leg swelling.  Gastrointestinal:  Negative for abdominal distention, blood in stool, diarrhea and nausea.  Genitourinary:  Negative for frequency and hematuria.  Musculoskeletal:  Positive for arthralgias. Negative for back pain, gait problem, joint  swelling and neck pain.  Skin:  Negative for rash.  Neurological:  Negative for dizziness, tremors, speech difficulty and weakness.  Psychiatric/Behavioral:  Negative for agitation, dysphoric mood, sleep disturbance and suicidal ideas. The patient is not nervous/anxious.     Objective:  BP (!) 156/84   Pulse 70   Temp 98.3 F (36.8 C)   Ht 5' 10 (1.778 m)   Wt 224 lb 3.2 oz (101.7 kg)   SpO2 97%   BMI 32.17 kg/m   BP Readings from Last 3 Encounters:  10/12/23 (!) 156/84  01/20/23 130/80  10/04/22 (!) 152/90    Wt Readings from Last 3 Encounters:  10/12/23 224 lb 3.2 oz (101.7 kg)  01/20/23 224 lb (101.6 kg)  10/04/22 216 lb 7.9 oz (98.2 kg)    Physical Exam Constitutional:      General: He is not in acute distress.    Appearance: He is well-developed.     Comments: NAD  Eyes:     Conjunctiva/sclera: Conjunctivae normal.     Pupils: Pupils are equal, round, and reactive to light.  Neck:     Thyroid : No thyromegaly.     Vascular: No JVD.  Cardiovascular:     Rate and Rhythm: Normal rate and regular rhythm.     Heart sounds: Murmur heard.     No friction rub. No gallop.  Pulmonary:     Effort: Pulmonary effort is normal. No respiratory distress.     Breath sounds: Normal breath sounds. No wheezing  or rales.  Chest:     Chest wall: No tenderness.  Abdominal:     General: Bowel sounds are normal. There is no distension.     Palpations: Abdomen is soft. There is no mass.     Tenderness: There is no abdominal tenderness. There is no guarding or rebound.  Musculoskeletal:        General: No tenderness. Normal range of motion.     Cervical back: Normal range of motion.  Lymphadenopathy:     Cervical: No cervical adenopathy.  Skin:    General: Skin is warm and dry.     Findings: No rash.  Neurological:     Mental Status: He is alert and oriented to person, place, and time.     Cranial Nerves: No cranial nerve deficit.     Motor: No abnormal muscle tone.      Coordination: Coordination normal.     Gait: Gait normal.     Deep Tendon Reflexes: Reflexes are normal and symmetric.  Psychiatric:        Behavior: Behavior normal.        Thought Content: Thought content normal.        Judgment: Judgment normal.     Lab Results  Component Value Date   WBC 10.6 (H) 01/03/2023   HGB 14.5 01/03/2023   HCT 43.3 01/03/2023   PLT 231.0 01/03/2023   GLUCOSE 123 (H) 09/02/2022   CHOL 162 02/04/2022   TRIG 62.0 02/04/2022   HDL 63.60 02/04/2022   LDLCALC 86 02/04/2022   ALT 15 09/02/2022   AST 15 09/02/2022   NA 139 09/02/2022   K 4.4 09/02/2022   CL 106 09/02/2022   CREATININE 1.01 09/02/2022   BUN 16 09/02/2022   CO2 26 09/02/2022   TSH 2.15 02/04/2022   PSA 0.71 02/04/2022   HGBA1C 6.0 09/02/2022    CT CHEST LUNG CA SCREEN LOW DOSE W/O CM Result Date: 04/15/2023 CLINICAL DATA:  30 pack-year smoking history/quit 8 years ago EXAM: CT CHEST WITHOUT CONTRAST LOW-DOSE FOR LUNG CANCER SCREENING TECHNIQUE: Multidetector CT imaging of the chest was performed following the standard protocol without IV contrast. RADIATION DOSE REDUCTION: This exam was performed according to the departmental dose-optimization program which includes automated exposure control, adjustment of the mA and/or kV according to patient size and/or use of iterative reconstruction technique. COMPARISON:  03/30/2022 FINDINGS: Cardiovascular: Aortic atherosclerosis. Normal heart size, without pericardial effusion. Aortic valve calcification. Mediastinum/Nodes: No mediastinal or hilar adenopathy, given limitations of unenhanced CT. Lungs/Pleura: No pleural fluid. Mild centrilobular emphysema. The areas of basilar predominant subpleural ground-glass and interstitial coarsening have resolved. Pulmonary nodules of maximally 4.5 mm again identified. Upper Abdomen: Normal imaged portions of the liver, spleen, stomach, pancreas, gallbladder, adrenal glands, kidneys. Musculoskeletal: Midthoracic  spondylosis. IMPRESSION: Lung-RADS 2, benign appearance or behavior. Continue annual screening with low-dose chest CT without contrast in 12 months. Aortic valvular calcifications. Consider echocardiography to evaluate for valvular dysfunction. Aortic atherosclerosis (ICD10-I70.0) and emphysema (ICD10-J43.9). Electronically Signed   By: Rockey Kilts M.D.   On: 04/15/2023 09:05    Assessment & Plan:   Problem List Items Addressed This Visit     Dyslipidemia   Strong family history of heart disease, currently on Lipitor. Added ASA, Vit D Coronary calcium  CT was offered once in place of He is regular yearly chest CT scan      Relevant Medications   atorvastatin  (LIPITOR) 40 MG tablet   Other Relevant Orders   TSH  Lipid panel   Former smoker   He is a former smoker.  He has been getting low intensity chest CT q 12 mo for lung cancer screening      Heart murmur   Will obtain an echocardiogram      Relevant Orders   ECHOCARDIOGRAM COMPLETE   Hyperglycemia   Monitor A1c      Relevant Orders   Hemoglobin A1c   Well adult exam - Primary    We discussed age appropriate health related issues, including available/recomended screening tests and vaccinations. Labs were ordered to be later reviewed . All questions were answered. We discussed one or more of the following - seat belt use, use of sunscreen/sun exposure exercise, fall risk reduction, second hand smoke exposure, firearm use and storage, seat belt use, a need for adhering to healthy diet and exercise. Labs were ordered.  All questions were answered. He is a former smoker.  He has been getting low intensity chest CT q 12 mo for lung cancer screening Strong family history of heart disease, currently on Lipitor. Added ASA, Vit D Coronary calcium  CT was offered once in place of He is regular yearly chest CT scan      Relevant Orders   TSH   Urinalysis   CBC with Differential/Platelet   Lipid panel   PSA   Comprehensive  metabolic panel with GFR   Other Visit Diagnoses       Immunization due       Relevant Orders   Flu vaccine HIGH DOSE PF(Fluzone Trivalent) (Completed)         Meds ordered this encounter  Medications   aspirin  EC 81 MG tablet    Sig: Take 1 tablet (81 mg total) by mouth daily.   Cholecalciferol  (VITAMIN D3) 50 MCG (2000 UT) capsule    Sig: Take 1 capsule (2,000 Units total) by mouth daily.    Dispense:  100 capsule    Refill:  3   atorvastatin  (LIPITOR) 40 MG tablet    Sig: Take 1 tablet (40 mg total) by mouth daily.    Dispense:  90 tablet    Refill:  3      Follow-up: Return in about 6 months (around 04/11/2024) for a follow-up visit.  Marolyn Noel, MD

## 2023-10-16 DIAGNOSIS — R011 Cardiac murmur, unspecified: Secondary | ICD-10-CM | POA: Insufficient documentation

## 2023-10-16 NOTE — Assessment & Plan Note (Signed)
Will obtain an echocardiogram.

## 2023-10-16 NOTE — Assessment & Plan Note (Addendum)
  We discussed age appropriate health related issues, including available/recomended screening tests and vaccinations. Labs were ordered to be later reviewed . All questions were answered. We discussed one or more of the following - seat belt use, use of sunscreen/sun exposure exercise, fall risk reduction, second hand smoke exposure, firearm use and storage, seat belt use, a need for adhering to healthy diet and exercise. Labs were ordered.  All questions were answered. He is a former smoker.  He has been getting low intensity chest CT q 12 mo for lung cancer screening Strong family history of heart disease, currently on Lipitor. Added ASA, Vit D Coronary calcium  CT was offered once in place of He is regular yearly chest CT scan

## 2023-11-06 ENCOUNTER — Encounter (HOSPITAL_BASED_OUTPATIENT_CLINIC_OR_DEPARTMENT_OTHER): Payer: Self-pay | Admitting: Orthopaedic Surgery

## 2023-11-07 ENCOUNTER — Other Ambulatory Visit: Payer: Self-pay

## 2023-11-07 ENCOUNTER — Ambulatory Visit (HOSPITAL_BASED_OUTPATIENT_CLINIC_OR_DEPARTMENT_OTHER): Admitting: Student

## 2023-11-07 ENCOUNTER — Other Ambulatory Visit (HOSPITAL_COMMUNITY): Payer: Self-pay

## 2023-11-07 ENCOUNTER — Ambulatory Visit (INDEPENDENT_AMBULATORY_CARE_PROVIDER_SITE_OTHER)

## 2023-11-07 DIAGNOSIS — M25462 Effusion, left knee: Secondary | ICD-10-CM | POA: Diagnosis not present

## 2023-11-07 DIAGNOSIS — M1712 Unilateral primary osteoarthritis, left knee: Secondary | ICD-10-CM | POA: Diagnosis not present

## 2023-11-07 DIAGNOSIS — M25562 Pain in left knee: Secondary | ICD-10-CM

## 2023-11-07 DIAGNOSIS — G8929 Other chronic pain: Secondary | ICD-10-CM

## 2023-11-07 MED ORDER — TRIAMCINOLONE ACETONIDE 40 MG/ML IJ SUSP
2.0000 mL | INTRAMUSCULAR | Status: AC | PRN
  Administered 2023-11-07: 2 mL via INTRA_ARTICULAR

## 2023-11-07 MED ORDER — LIDOCAINE HCL 1 % IJ SOLN
4.0000 mL | INTRAMUSCULAR | Status: AC | PRN
  Administered 2023-11-07: 4 mL

## 2023-11-07 NOTE — Progress Notes (Signed)
 Chief Complaint: Left knee pain    Discussed the use of AI scribe software for clinical note transcription with the patient, who gave verbal consent to proceed.  History of Present Illness Chad Manning is a 68 year old male who presents with knee pain and swelling. He is accompanied by his wife, [name].  Knee pain and swelling have been present for some time, primarily located behind the knee with a sensation of pressure, sometimes radiating to the top of the calf. No recent injury. Pain worsens with walking, leading to reduced activity. Ibuprofen  and ice provide some relief. He underwent meniscus repair surgery last year and shoulder surgery the year before. 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 He stays active by riding an exercise bike. He typically rides for 20 minutes, covering about five miles, but recently only manages a mile and a half due to increased discomfort. Previously used meloxicam and Robaxin  have been helpful. Current knee pain differs from pre-meniscus repair pain, described as a 'toothache' type rather than sharp.   Surgical History:   Left knee arthroscopy with medial and lateral meniscal repair - September 2024  PMH/PSH/Family History/Social History/Meds/Allergies:    Past Medical History:  Diagnosis Date   Arthritis    Asthma    x 1 age 40   Hyperlipidemia    Right rotator cuff tear    Past Surgical History:  Procedure Laterality Date   COLONOSCOPY     JOINT REPLACEMENT     KNEE ARTHROSCOPY WITH MENISCAL REPAIR Left 10/04/2022   Procedure: LEFT KNEE ARTHROSCOPY WITH MEDIAL AND LATERAL MENISCAL REPAIR;  Surgeon: Genelle Standing, MD;  Location: [facility];  Service: Orthopedics;  Laterality: Left;   SHOULDER ARTHROSCOPY WITH ROTATOR CUFF REPAIR AND OPEN BICEPS TENODESIS Right 07/13/2021   Procedure: RIGHT SHOULDER ARTHROSCOPY WITH ROTATOR CUFF REPAIR AND BICEPS TENODESIS;  Surgeon: Genelle Standing, MD;  Location: [facility] OR;   Service: Orthopedics;  Laterality: Right;   TOTAL HIP ARTHROPLASTY     bilatera   Social History   Socioeconomic History   Marital status: Married    Spouse name: Not on file   Number of children: 2   Years of education: Not on file   Highest education level: Bachelor's degree (e.g., BA, AB, BS)  Occupational History   Not on file  Tobacco Use   Smoking status: Former    Current packs/day: 0.00    Average packs/day: 0.5 packs/day for 40.0 years (20.0 ttl pk-yrs)    Types: Cigarettes    Start date: 28    Quit date: 2010    Years since quitting: 15.8   Smokeless tobacco: Never  Vaping Use   Vaping status: Never Used  Substance and Sexual Activity   Alcohol use: Yes    Alcohol/week: 5.0 - 10.0 standard drinks of alcohol    Types: 5 - 10 Cans of beer per week    Comment: Beer   Drug use: Never   Sexual activity: Yes    Birth control/protection: None  Other Topics Concern   Not on file  Social History Narrative   [employer]   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[employer]   Social Drivers of Health   Financial Resource Strain: Low Risk  (10/08/2023)   Overall Financial Resource Strain (CARDIA)    Difficulty of Paying Living Expenses: Not hard  at all  Food Insecurity: No Food Insecurity (10/08/2023)   Hunger Vital Sign    Worried About Running Out of Food in the Last Year: Never true    Ran Out of Food in the Last Year: Never true  Transportation Needs: No Transportation Needs (10/08/2023)   PRAPARE - Administrator, Civil Service (Medical): No    Lack of Transportation (Non-Medical): No  Physical Activity: Insufficiently Active (10/08/2023)   Exercise Vital Sign    Days of Exercise per Week: 2 days    Minutes of Exercise per Session: 20 min  Stress: No Stress Concern Present (10/08/2023)   [employer] of Occupational Health - Occupational Stress Questionnaire    Feeling of Stress: Not at all  Social Connections: Unknown (10/08/2023)   Social Connection and Isolation  Panel    Frequency of Communication with Friends and Family: More than three times a week    Frequency of Social Gatherings with Friends and Family: More than three times a week    Attends Religious Services: Not on Marketing Executive or Organizations: Not on file    Attends Banker Meetings: Not on file    Marital Status: Married   Family History  Problem Relation Age of Onset   Cancer Mother 74       breast cancer   Heart disease Mother    Heart disease Father 57       CAD   Cancer Sister 40       lung cancer   Heart disease Sister    Colon cancer Neg Hx    Prostate cancer Neg Hx    Esophageal cancer Neg Hx    Rectal cancer Neg Hx    No Known Allergies Current Outpatient Medications  Medication Sig Dispense Refill   amoxicillin  (AMOXIL ) 500 MG capsule Take 4 capsules 1 hour prior to dental treatment. 18 capsule 1   aspirin  EC 81 MG tablet Take 1 tablet (81 mg total) by mouth daily.     atorvastatin  (LIPITOR) 40 MG tablet Take 1 tablet (40 mg total) by mouth daily. 90 tablet 3   Cholecalciferol (VITAMIN D3) 50 MCG (2000 UT) capsule Take 1 capsule (2,000 Units total) by mouth daily. 100 capsule 3   docusate sodium (COLACE) 100 MG capsule Take 200 mg by mouth daily.     ibuprofen  (ADVIL ) 800 MG tablet Take 1 tablet (800 mg total) by mouth every 8 (eight) hours with food. Please alternate with Tylenol . 30 tablet 4   methocarbamol  (ROBAXIN ) 500 MG tablet Take 1 tablet (500 mg total) by mouth 4 (four) times daily. 30 tablet 3   No current facility-administered medications for this visit.   No results found.  Review of Systems:   A ROS was performed including pertinent positives and negatives as documented in the HPI.  Physical Exam :   Constitutional: NAD and appears stated age Neurological: Alert and oriented Psych: Appropriate affect and cooperative There were no vitals taken for this visit.   Comprehensive Musculoskeletal Exam:    Exam of the  left knee demonstrates active range of motion from 0 to 110 degrees without significant crepitus.  Mild medial joint line tenderness.  There is a small Baker's cyst palpable in the posterior knee.  Minimal effusion without overlying erythema or warmth.  Stable collaterals with varus and valgus stress.  Imaging:   Xray (left knee 4 views): Mild to moderate tricompartmental degenerative changes without evidence of  acute abnormality   I personally reviewed and interpreted the radiographs.      Assessment & Plan Left knee osteoarthritis with associated Baker's cyst   Chronic left knee osteoarthritis presents with mild to moderate severity. X-ray reveals joint space narrowing, bone spurs, and sclerosis. Pain and pressure behind the knee are likely due to a Baker's cyst secondary to arthritis, though the cyst is not large enough for drainage. Administer a steroid injection to the left knee to improve mobility and control inflammation. Encourage low-impact exercises like cycling. Monitor response to the injection and reassess if symptoms persist or worsen.     Procedure Note  Patient: Chad Manning             Date of Birth: [birth year]           MRN: [id number]             Visit Date: 11/07/2023  Procedures: Visit Diagnoses:  1. Chronic pain of left knee     Large Joint Inj: L knee on 11/07/2023 4:46 PM Indications: pain Details: 22 G 1.5 in needle, anterolateral approach Medications: 4 mL lidocaine  1 %; 2 mL triamcinolone acetonide 40 MG/ML Outcome: tolerated well, no immediate complications Procedure, treatment alternatives, risks and benefits explained, specific risks discussed. Consent was given by the patient. Immediately prior to procedure a time out was called to verify the correct patient, procedure, equipment, support staff and site/side marked as required. Patient was prepped and draped in the usual sterile fashion.       I personally saw and evaluated the patient, and  participated in the management and treatment plan.  Leonce Reveal, PA-C Orthopedics

## 2023-11-14 ENCOUNTER — Encounter: Payer: Self-pay | Admitting: Radiology

## 2023-11-21 ENCOUNTER — Ambulatory Visit (HOSPITAL_COMMUNITY)

## 2023-12-23 ENCOUNTER — Ambulatory Visit (HOSPITAL_COMMUNITY)
Admission: RE | Admit: 2023-12-23 | Discharge: 2023-12-23 | Disposition: A | Source: Ambulatory Visit | Attending: Internal Medicine | Admitting: Internal Medicine

## 2023-12-23 ENCOUNTER — Other Ambulatory Visit

## 2023-12-23 DIAGNOSIS — R011 Cardiac murmur, unspecified: Secondary | ICD-10-CM

## 2023-12-23 DIAGNOSIS — R739 Hyperglycemia, unspecified: Secondary | ICD-10-CM

## 2023-12-23 DIAGNOSIS — E785 Hyperlipidemia, unspecified: Secondary | ICD-10-CM | POA: Diagnosis not present

## 2023-12-23 DIAGNOSIS — Z0000 Encounter for general adult medical examination without abnormal findings: Secondary | ICD-10-CM

## 2023-12-23 LAB — CBC WITH DIFFERENTIAL/PLATELET
Basophils Absolute: 0.1 K/uL (ref 0.0–0.1)
Basophils Relative: 0.7 % (ref 0.0–3.0)
Eosinophils Absolute: 0.3 K/uL (ref 0.0–0.7)
Eosinophils Relative: 3.8 % (ref 0.0–5.0)
HCT: 43.7 % (ref 39.0–52.0)
Hemoglobin: 14.7 g/dL (ref 13.0–17.0)
Lymphocytes Relative: 24.9 % (ref 12.0–46.0)
Lymphs Abs: 2 K/uL (ref 0.7–4.0)
MCHC: 33.7 g/dL (ref 30.0–36.0)
MCV: 90.1 fl (ref 78.0–100.0)
Monocytes Absolute: 0.6 K/uL (ref 0.1–1.0)
Monocytes Relative: 7.9 % (ref 3.0–12.0)
Neutro Abs: 4.9 K/uL (ref 1.4–7.7)
Neutrophils Relative %: 62.7 % (ref 43.0–77.0)
Platelets: 214 K/uL (ref 150.0–400.0)
RBC: 4.85 Mil/uL (ref 4.22–5.81)
RDW: 14.6 % (ref 11.5–15.5)
WBC: 7.8 K/uL (ref 4.0–10.5)

## 2023-12-23 LAB — COMPREHENSIVE METABOLIC PANEL WITH GFR
ALT: 25 U/L (ref 0–53)
AST: 19 U/L (ref 0–37)
Albumin: 4.6 g/dL (ref 3.5–5.2)
Alkaline Phosphatase: 74 U/L (ref 39–117)
BUN: 11 mg/dL (ref 6–23)
CO2: 28 meq/L (ref 19–32)
Calcium: 9.6 mg/dL (ref 8.4–10.5)
Chloride: 104 meq/L (ref 96–112)
Creatinine, Ser: 1.05 mg/dL (ref 0.40–1.50)
GFR: 72.94 mL/min (ref >60.00)
Glucose, Bld: 127 mg/dL — ABNORMAL HIGH (ref 70–99)
Potassium: 4 meq/L (ref 3.5–5.1)
Sodium: 140 meq/L (ref 135–145)
Total Bilirubin: 1.5 mg/dL — ABNORMAL HIGH (ref 0.2–1.2)
Total Protein: 7 g/dL (ref 6.0–8.3)

## 2023-12-23 LAB — URINALYSIS
Bilirubin Urine: NEGATIVE
Hgb urine dipstick: NEGATIVE
Ketones, ur: NEGATIVE
Leukocytes,Ua: NEGATIVE
Nitrite: NEGATIVE
Specific Gravity, Urine: 1.01 (ref 1.000–1.030)
Total Protein, Urine: NEGATIVE
Urine Glucose: NEGATIVE
Urobilinogen, UA: 0.2 (ref 0.0–1.0)
pH: 6 (ref 5.0–8.0)

## 2023-12-23 LAB — LIPID PANEL
Cholesterol: 172 mg/dL (ref 0–200)
HDL: 63.3 mg/dL (ref >39.00)
LDL Cholesterol: 90 mg/dL (ref 0–99)
NonHDL: 108.38
Total CHOL/HDL Ratio: 3
Triglycerides: 91 mg/dL (ref 0.0–149.0)
VLDL: 18.2 mg/dL (ref 0.0–40.0)

## 2023-12-23 LAB — ECHOCARDIOGRAM COMPLETE
Area-P 1/2: 4.04 cm2
S' Lateral: 3.3 cm

## 2023-12-23 LAB — HEMOGLOBIN A1C: Hgb A1c MFr Bld: 6.2 % (ref 4.6–6.5)

## 2023-12-23 LAB — TSH: TSH: 3.27 u[IU]/mL (ref 0.35–5.50)

## 2023-12-23 LAB — PSA: PSA: 0.91 ng/mL (ref 0.10–4.00)

## 2023-12-23 MED ORDER — PERFLUTREN LIPID MICROSPHERE
1.0000 mL | INTRAVENOUS | Status: AC | PRN
  Administered 2023-12-23: 2 mL via INTRAVENOUS

## 2023-12-27 ENCOUNTER — Ambulatory Visit: Payer: Self-pay | Admitting: Internal Medicine

## 2024-01-10 ENCOUNTER — Other Ambulatory Visit (HOSPITAL_COMMUNITY): Payer: Self-pay

## 2024-04-02 ENCOUNTER — Other Ambulatory Visit

## 2024-04-03 ENCOUNTER — Other Ambulatory Visit
